# Patient Record
Sex: Male | Born: 1980 | Race: Black or African American | Hispanic: No | Smoking: Current every day smoker
Health system: Southern US, Community
[De-identification: ages and names within clinical notes are randomized; demographics above are authoritative.]

## PROBLEM LIST (undated history)

## (undated) DIAGNOSIS — F1010 Alcohol abuse, uncomplicated: Secondary | ICD-10-CM

## (undated) DIAGNOSIS — N419 Inflammatory disease of prostate, unspecified: Secondary | ICD-10-CM

## (undated) DIAGNOSIS — I10 Essential (primary) hypertension: Secondary | ICD-10-CM

## (undated) HISTORY — DX: Essential (primary) hypertension: I10

---

## 2009-08-31 ENCOUNTER — Emergency Department (HOSPITAL_COMMUNITY): Admission: EM | Admit: 2009-08-31 | Discharge: 2009-08-31 | Payer: Self-pay | Admitting: Emergency Medicine

## 2009-12-05 ENCOUNTER — Emergency Department (HOSPITAL_COMMUNITY): Admission: EM | Admit: 2009-12-05 | Discharge: 2009-12-05 | Payer: Self-pay | Admitting: Emergency Medicine

## 2010-09-28 ENCOUNTER — Emergency Department (HOSPITAL_COMMUNITY): Payer: Self-pay

## 2010-09-28 ENCOUNTER — Emergency Department (HOSPITAL_COMMUNITY)
Admission: EM | Admit: 2010-09-28 | Discharge: 2010-09-29 | Disposition: A | Discharge status: Home / Self Care | Payer: Self-pay | Attending: Emergency Medicine | Admitting: Emergency Medicine

## 2010-09-28 DIAGNOSIS — N509 Disorder of male genital organs, unspecified: Secondary | ICD-10-CM | POA: Insufficient documentation

## 2010-09-28 DIAGNOSIS — F17200 Nicotine dependence, unspecified, uncomplicated: Secondary | ICD-10-CM | POA: Insufficient documentation

## 2010-09-28 DIAGNOSIS — R61 Generalized hyperhidrosis: Secondary | ICD-10-CM | POA: Insufficient documentation

## 2010-09-28 DIAGNOSIS — R634 Abnormal weight loss: Secondary | ICD-10-CM | POA: Insufficient documentation

## 2010-09-28 LAB — DIFFERENTIAL
Eosinophils Relative: 2 % (ref 0–5)
Lymphocytes Relative: 54 % — ABNORMAL HIGH (ref 12–46)
Lymphs Abs: 4.8 10*3/uL — ABNORMAL HIGH (ref 0.7–4.0)

## 2010-09-28 LAB — CBC
HCT: 41 % (ref 39.0–52.0)
MCV: 98.8 fL (ref 78.0–100.0)
RBC: 4.15 MIL/uL — ABNORMAL LOW (ref 4.22–5.81)
RDW: 14.7 % (ref 11.5–15.5)
WBC: 8.9 10*3/uL (ref 4.0–10.5)

## 2010-09-28 LAB — COMPREHENSIVE METABOLIC PANEL
Alkaline Phosphatase: 53 U/L (ref 39–117)
BUN: 9 mg/dL (ref 6–23)
CO2: 32 mEq/L (ref 19–32)
Chloride: 102 mEq/L (ref 96–112)
GFR calc non Af Amer: >60 mL/min (ref >60)
Glucose, Bld: 96 mg/dL (ref 70–99)
Potassium: 3.7 mEq/L (ref 3.5–5.1)
Total Bilirubin: 0.4 mg/dL (ref 0.3–1.2)

## 2010-09-29 ENCOUNTER — Emergency Department (HOSPITAL_COMMUNITY): Payer: Self-pay

## 2010-09-29 LAB — URINE MICROSCOPIC-ADD ON

## 2010-09-29 LAB — URINALYSIS, ROUTINE W REFLEX MICROSCOPIC
Protein, ur: NEGATIVE mg/dL
Urobilinogen, UA: 1 mg/dL (ref 0.0–1.0)

## 2010-09-29 MED ORDER — IOHEXOL 300 MG/ML  SOLN: 100.0000 mL | Freq: Once | INTRAMUSCULAR | Status: DC | PRN

## 2010-10-05 ENCOUNTER — Emergency Department (HOSPITAL_COMMUNITY)
Admission: EM | Admit: 2010-10-05 | Discharge: 2010-10-05 | Disposition: A | Discharge status: Home / Self Care | Payer: Self-pay | Attending: Emergency Medicine | Admitting: Emergency Medicine

## 2010-10-05 DIAGNOSIS — R1013 Epigastric pain: Secondary | ICD-10-CM | POA: Insufficient documentation

## 2010-10-05 DIAGNOSIS — R197 Diarrhea, unspecified: Secondary | ICD-10-CM | POA: Insufficient documentation

## 2010-10-05 DIAGNOSIS — R10816 Epigastric abdominal tenderness: Secondary | ICD-10-CM | POA: Insufficient documentation

## 2010-10-05 DIAGNOSIS — N4289 Other specified disorders of prostate: Secondary | ICD-10-CM | POA: Insufficient documentation

## 2010-10-05 LAB — URINALYSIS, ROUTINE W REFLEX MICROSCOPIC
Bilirubin Urine: NEGATIVE
Hgb urine dipstick: NEGATIVE
Ketones, ur: NEGATIVE mg/dL
Nitrite: NEGATIVE
Protein, ur: NEGATIVE mg/dL
Specific Gravity, Urine: 1.006 (ref 1.005–1.030)
Urine Glucose, Fasting: NEGATIVE mg/dL
Urobilinogen, UA: 0.2 mg/dL (ref 0.0–1.0)
pH: 6 (ref 5.0–8.0)

## 2010-10-29 LAB — URINALYSIS, ROUTINE W REFLEX MICROSCOPIC
Bilirubin Urine: NEGATIVE
Hgb urine dipstick: NEGATIVE
Nitrite: NEGATIVE
Specific Gravity, Urine: 1.027 (ref 1.005–1.030)
pH: 7 (ref 5.0–8.0)

## 2010-10-29 LAB — URINE MICROSCOPIC-ADD ON

## 2010-10-30 LAB — URINALYSIS, ROUTINE W REFLEX MICROSCOPIC
Bilirubin Urine: NEGATIVE
Glucose, UA: NEGATIVE mg/dL
Ketones, ur: 15 mg/dL — AB
Nitrite: NEGATIVE
Protein, ur: 30 mg/dL — AB
Specific Gravity, Urine: 1.026 (ref 1.005–1.030)
Urobilinogen, UA: 1 mg/dL (ref 0.0–1.0)
pH: 6 (ref 5.0–8.0)

## 2010-10-30 LAB — URINE MICROSCOPIC-ADD ON

## 2010-10-30 LAB — URINE CULTURE: Colony Count: >=100000

## 2011-02-01 ENCOUNTER — Emergency Department (HOSPITAL_COMMUNITY)
Admission: EM | Admit: 2011-02-01 | Discharge: 2011-02-01 | Disposition: A | Discharge status: Home / Self Care | Payer: Self-pay | Attending: Emergency Medicine | Admitting: Emergency Medicine

## 2011-02-01 ENCOUNTER — Emergency Department (HOSPITAL_COMMUNITY): Payer: Self-pay

## 2011-02-01 ENCOUNTER — Encounter (HOSPITAL_COMMUNITY): Payer: Self-pay | Admitting: Radiology

## 2011-02-01 DIAGNOSIS — R079 Chest pain, unspecified: Secondary | ICD-10-CM | POA: Insufficient documentation

## 2011-02-01 DIAGNOSIS — M549 Dorsalgia, unspecified: Secondary | ICD-10-CM | POA: Insufficient documentation

## 2011-02-01 DIAGNOSIS — N509 Disorder of male genital organs, unspecified: Secondary | ICD-10-CM | POA: Insufficient documentation

## 2011-02-01 DIAGNOSIS — R042 Hemoptysis: Secondary | ICD-10-CM | POA: Insufficient documentation

## 2011-02-01 DIAGNOSIS — R361 Hematospermia: Secondary | ICD-10-CM | POA: Insufficient documentation

## 2011-02-01 DIAGNOSIS — R10819 Abdominal tenderness, unspecified site: Secondary | ICD-10-CM | POA: Insufficient documentation

## 2011-02-01 DIAGNOSIS — R0602 Shortness of breath: Secondary | ICD-10-CM | POA: Insufficient documentation

## 2011-02-01 DIAGNOSIS — R319 Hematuria, unspecified: Secondary | ICD-10-CM | POA: Insufficient documentation

## 2011-02-01 DIAGNOSIS — R109 Unspecified abdominal pain: Secondary | ICD-10-CM | POA: Insufficient documentation

## 2011-02-01 DIAGNOSIS — K6289 Other specified diseases of anus and rectum: Secondary | ICD-10-CM | POA: Insufficient documentation

## 2011-02-01 DIAGNOSIS — N419 Inflammatory disease of prostate, unspecified: Secondary | ICD-10-CM | POA: Insufficient documentation

## 2011-02-01 LAB — URINALYSIS, ROUTINE W REFLEX MICROSCOPIC
Bilirubin Urine: NEGATIVE
Glucose, UA: NEGATIVE mg/dL
Ketones, ur: NEGATIVE mg/dL
Protein, ur: 30 mg/dL — AB
Urobilinogen, UA: 1 mg/dL (ref 0.0–1.0)

## 2011-02-01 LAB — DIFFERENTIAL
Basophils Absolute: 0 10*3/uL (ref 0.0–0.1)
Eosinophils Relative: 2 % (ref 0–5)
Lymphocytes Relative: 42 % (ref 12–46)
Lymphs Abs: 2.5 10*3/uL (ref 0.7–4.0)
Neutrophils Relative %: 41 % — ABNORMAL LOW (ref 43–77)

## 2011-02-01 LAB — URINE MICROSCOPIC-ADD ON

## 2011-02-01 LAB — CBC
HCT: 44.9 % (ref 39.0–52.0)
MCV: 95.5 fL (ref 78.0–100.0)
Platelets: 291 10*3/uL (ref 150–400)
RBC: 4.7 MIL/uL (ref 4.22–5.81)
RDW: 13.5 % (ref 11.5–15.5)
WBC: 5.9 10*3/uL (ref 4.0–10.5)

## 2011-02-01 LAB — BASIC METABOLIC PANEL
BUN: 12 mg/dL (ref 6–23)
CO2: 30 mEq/L (ref 19–32)
Chloride: 100 mEq/L (ref 96–112)
Creatinine, Ser: 0.81 mg/dL (ref 0.50–1.35)
GFR calc Af Amer: >60 mL/min (ref >60)
Potassium: 4.9 mEq/L (ref 3.5–5.1)

## 2011-02-01 MED ORDER — IOHEXOL 300 MG/ML  SOLN
100.0000 mL | Freq: Once | INTRAMUSCULAR | Status: AC | PRN
  Administered 2011-02-01: 100 mL via INTRAVENOUS

## 2011-08-20 ENCOUNTER — Emergency Department (HOSPITAL_COMMUNITY)
Admission: EM | Admit: 2011-08-20 | Discharge: 2011-08-20 | Disposition: A | Discharge status: Home / Self Care | Payer: Self-pay | Attending: Emergency Medicine | Admitting: Emergency Medicine

## 2011-08-20 ENCOUNTER — Emergency Department (HOSPITAL_COMMUNITY): Payer: Self-pay

## 2011-08-20 ENCOUNTER — Encounter (HOSPITAL_COMMUNITY): Payer: Self-pay

## 2011-08-20 DIAGNOSIS — M549 Dorsalgia, unspecified: Secondary | ICD-10-CM | POA: Insufficient documentation

## 2011-08-20 DIAGNOSIS — R109 Unspecified abdominal pain: Secondary | ICD-10-CM | POA: Insufficient documentation

## 2011-08-20 DIAGNOSIS — N419 Inflammatory disease of prostate, unspecified: Secondary | ICD-10-CM | POA: Insufficient documentation

## 2011-08-20 LAB — CBC
MCH: 33.5 pg (ref 26.0–34.0)
MCHC: 34.2 g/dL (ref 30.0–36.0)
Platelets: 218 10*3/uL (ref 150–400)
RDW: 13.4 % (ref 11.5–15.5)

## 2011-08-20 LAB — URINE CULTURE
Colony Count: >=100000
Culture  Setup Time: 201301082215

## 2011-08-20 LAB — DIFFERENTIAL
Basophils Absolute: 0 10*3/uL (ref 0.0–0.1)
Eosinophils Absolute: 0.1 10*3/uL (ref 0.0–0.7)
Lymphocytes Relative: 20 % (ref 12–46)
Lymphs Abs: 1.3 10*3/uL (ref 0.7–4.0)
Neutrophils Relative %: 64 % (ref 43–77)

## 2011-08-20 LAB — URINALYSIS, ROUTINE W REFLEX MICROSCOPIC
Glucose, UA: NEGATIVE mg/dL
Ketones, ur: NEGATIVE mg/dL
Nitrite: NEGATIVE
Protein, ur: NEGATIVE mg/dL

## 2011-08-20 LAB — LIPASE, BLOOD: Lipase: 55 U/L (ref 11–59)

## 2011-08-20 LAB — COMPREHENSIVE METABOLIC PANEL
ALT: 13 U/L (ref 0–53)
AST: 20 U/L (ref 0–37)
CO2: 31 mEq/L (ref 19–32)
Chloride: 100 mEq/L (ref 96–112)
GFR calc Af Amer: >90 mL/min (ref >90)
GFR calc non Af Amer: >90 mL/min (ref >90)
Glucose, Bld: 101 mg/dL — ABNORMAL HIGH (ref 70–99)
Sodium: 137 mEq/L (ref 135–145)
Total Bilirubin: 0.9 mg/dL (ref 0.3–1.2)

## 2011-08-20 LAB — URINE MICROSCOPIC-ADD ON

## 2011-08-20 MED ORDER — TAMSULOSIN HCL 0.4 MG PO CAPS: 0.4000 mg | ORAL_CAPSULE | Freq: Every day | ORAL | Status: DC

## 2011-08-20 MED ORDER — KETOROLAC TROMETHAMINE 30 MG/ML IJ SOLN: 30.0000 mg | Freq: Once | INTRAMUSCULAR | Status: DC

## 2011-08-20 MED ORDER — SODIUM CHLORIDE 0.9 % IV SOLN: Freq: Once | INTRAVENOUS | Status: DC

## 2011-08-20 MED ORDER — IOHEXOL 300 MG/ML  SOLN
80.0000 mL | Freq: Once | INTRAMUSCULAR | Status: AC | PRN
  Administered 2011-08-20: 80 mL via INTRAVENOUS

## 2011-08-20 MED ORDER — KETOROLAC TROMETHAMINE 60 MG/2ML IM SOLN
60.0000 mg | Freq: Once | INTRAMUSCULAR | Status: AC
  Administered 2011-08-20: 60 mg via INTRAMUSCULAR
  Filled 2011-08-20: qty 2

## 2011-08-20 MED ORDER — HYDROCODONE-ACETAMINOPHEN 5-325 MG PO TABS: 1.0000 | ORAL_TABLET | ORAL | Status: AC | PRN

## 2011-08-20 MED ORDER — DOXYCYCLINE HYCLATE 100 MG PO CAPS: 100.0000 mg | ORAL_CAPSULE | Freq: Two times a day (BID) | ORAL | Status: AC

## 2011-08-20 MED ORDER — SODIUM CHLORIDE 0.9 % IV BOLUS (SEPSIS)
1000.0000 mL | Freq: Once | INTRAVENOUS | Status: AC
  Administered 2011-08-20: 1000 mL via INTRAVENOUS

## 2011-08-20 NOTE — ED Notes (Signed)
Mid back pain with n/v for 2 days with cough

## 2011-08-20 NOTE — ED Notes (Signed)
Pt reports bilateral lower back pain x 2 days. Denies any injury. Denies any urinary symptoms. Pt reports intermittent upper abdominal pain associated with nausea and vomiting. Pt is very nonspecific in his complaints. Denies cough but states he has had cold like symptoms. Pt reports hx of infection to prostate? Denies any blood in urine.

## 2011-08-20 NOTE — ED Provider Notes (Signed)
History     CSN: [id number]  Arrival date & time 08/20/11  1015   First MD Initiated Contact with Patient 08/20/11 1029      Chief Complaint  Patient presents with  . Back Pain    (Consider location/radiation/quality/duration/timing/severity/associated sxs/prior treatment) Patient is a 31 y.o. male presenting with back pain. The history is provided by the patient.  Back Pain  This is a new problem. The current episode started 2 days ago. The problem occurs constantly. The problem has not changed since onset.The pain is associated with no known injury. The pain is present in the lumbar spine. The quality of the pain is described as aching. The pain does not radiate. The symptoms are aggravated by certain positions. The pain is the same all the time. Associated symptoms include abdominal pain. Pertinent negatives include no chest pain, no fever, no numbness, no weight loss, no headaches, no dysuria and no weakness.   Pt began to have back pain two days ago. Pain is located at mid back on both sides and seems to radiate to the right upper abdomen. He denies associated fever, nausea, vomiting.  Pt has a PMH of prostatitis. States he was seen for this when he lived in [place]; he was tested for STIs which was negative. He was on antibiotics for a brief period of time, several months. He has not been seen by urology since moving to [place] a year or two ago, citing financial reasons. He denies currently having urinary sx but admits rectal pain with defecation. He has had testicular pain when he has had this in the past but has not had any with the current illness.   History reviewed. No pertinent past medical history.  History reviewed. No pertinent past surgical history.  History reviewed. No pertinent family history.  History  Substance Use Topics  . Smoking status: Never Smoker   . Smokeless tobacco: Not on file  . Alcohol Use: No      Review of Systems  Constitutional: Negative for fever,  chills, weight loss, appetite change and unexpected weight change.  HENT: Negative for congestion, sore throat and rhinorrhea.   Respiratory: Negative for cough and shortness of breath.   Cardiovascular: Negative for chest pain and palpitations.  Gastrointestinal: Positive for nausea, vomiting and abdominal pain. Negative for diarrhea and constipation.  Genitourinary: Positive for flank pain. Negative for dysuria and testicular pain.  Musculoskeletal: Positive for back pain. Negative for myalgias and gait problem.  Skin: Negative for color change and rash.  Neurological: Negative for dizziness, weakness, numbness and headaches.    Allergies  Review of patient's allergies indicates no known allergies.  Home Medications   Current Outpatient Rx  Name Route Sig Dispense Refill  . IBUPROFEN 200 MG PO TABS Oral Take 200 mg by mouth 3 (three) times daily as needed. For pain       BP 115/72  Pulse 85  Temp(Src) 98.3 F (36.8 C) (Oral)  Resp 20  Ht 5\' 1"  (1.549 m)  Wt 145 lb (65.772 kg)  BMI 27.40 kg/m2  SpO2 98%  Physical Exam  Nursing note and vitals reviewed. Constitutional: He is oriented to person, place, and time. He appears well-developed and well-nourished. No distress.  HENT:  Head: Normocephalic and atraumatic.  Eyes: Conjunctivae and EOM are normal. Right eye exhibits no discharge. Left eye exhibits no discharge.  Neck: Normal range of motion.  Cardiovascular: Normal rate, regular rhythm and normal heart sounds.   Pulmonary/Chest: Effort normal and  breath sounds normal. No respiratory distress. He exhibits no tenderness.  Abdominal: Soft. Bowel sounds are normal. He exhibits no distension and no mass. There is tenderness. There is no rebound and no guarding.       Mildly tender to palpation in RUQ, questionably positive Murphy sign  Genitourinary: Prostate is enlarged and tender.  Musculoskeletal: Normal range of motion.  Neurological: He is alert and oriented to person,  place, and time.  Skin: Skin is warm and dry. He is not diaphoretic.  Psychiatric: He has a normal mood and affect.    ED Course  Procedures (including critical care time)  Labs Reviewed  COMPREHENSIVE METABOLIC PANEL - Abnormal; Notable for the following:    Glucose, Bld 101 (*)    All other components within normal limits  DIFFERENTIAL - Abnormal; Notable for the following:    Monocytes Relative 14 (*)    All other components within normal limits  URINALYSIS, ROUTINE W REFLEX MICROSCOPIC - Abnormal; Notable for the following:    APPearance TURBID (*)    Hgb urine dipstick LARGE (*)    Leukocytes, UA MODERATE (*)    All other components within normal limits  URINE MICROSCOPIC-ADD ON - Abnormal; Notable for the following:    Squamous Epithelial / LPF FEW (*)    Bacteria, UA FEW (*)    All other components within normal limits  CBC  LIPASE, BLOOD  GRAM STAIN  URINE CULTURE   Ct Abdomen Pelvis W Contrast  08/20/2011  *RADIOLOGY REPORT*  Clinical Data: Upper abdominal pain, nausea, vomiting.  CT ABDOMEN AND PELVIS WITH CONTRAST  Technique:  Multidetector CT imaging of the abdomen and pelvis was performed following the standard protocol during bolus administration of intravenous contrast.  Contrast: 80mL OMNIPAQUE IOHEXOL 300 MG/ML IV SOLN  Comparison: 02/01/2011  Findings: Lung bases are clear.  No effusions.  Heart is normal size.  Liver, gallbladder, spleen, pancreas, adrenals and kidneys are unremarkable.  Appendix is visualized and is normal. Bowel grossly unremarkable.  No free fluid, free air, or adenopathy.  Urinary bladder is unremarkable.  Multiple calcified phleboliths in the anatomic pelvis.  IMPRESSION: No acute findings in the abdomen or pelvis.  Original Report Authenticated By: Cyndie Chime, M.D.     1. Prostatitis       MDM  11:34 AM Pt with palpable paravertebral muscle spasm, however he also has RUQ tenderness, questionable Murphy's sign. He has had PMH of  prostatitis. Plan to obtain basic labs.  3:11 PM Pt's urine with large hgb (21-50 RBCs), mod leuks (11-20 WBCs). Rectal exam significant for tender, enlarged prostate. Pt was sent for CT which was negative. Consult placed to urology to discuss and ensure follow up.   3:30 PM Discussed with Dr. Ellin Goodie nurse; he will return call in next 30 mins.  4:19 PM Discussed with Dr. Isabel Caprice - he recommended: 1) send urine for cx 2) doxycycline BID 3) alpha blocker for urinary sx 4) follow up in 2-3 weeks Findings d/w pt. He verbalized understanding and agreed to plan.  Grant Fontana, [place] 08/20/11 [phone]

## 2011-08-20 NOTE — ED Provider Notes (Signed)
Medical screening examination/treatment/procedure(s) were performed by non-physician practitioner and as supervising physician I was immediately available for consultation/collaboration.   Dayton Bailiff, MD 08/20/11 [phone]

## 2011-12-18 ENCOUNTER — Encounter (HOSPITAL_COMMUNITY): Payer: Self-pay | Admitting: *Deleted

## 2011-12-18 ENCOUNTER — Emergency Department (HOSPITAL_COMMUNITY)
Admission: EM | Admit: 2011-12-18 | Discharge: 2011-12-18 | Disposition: A | Discharge status: Home / Self Care | Payer: Self-pay | Attending: Emergency Medicine | Admitting: Emergency Medicine

## 2011-12-18 DIAGNOSIS — K0889 Other specified disorders of teeth and supporting structures: Secondary | ICD-10-CM

## 2011-12-18 DIAGNOSIS — K089 Disorder of teeth and supporting structures, unspecified: Secondary | ICD-10-CM | POA: Insufficient documentation

## 2011-12-18 HISTORY — DX: Inflammatory disease of prostate, unspecified: N41.9

## 2011-12-18 MED ORDER — HYDROCODONE-ACETAMINOPHEN 5-325 MG PO TABS: 1.0000 | ORAL_TABLET | ORAL | Status: AC | PRN

## 2011-12-18 MED ORDER — HYDROCODONE-ACETAMINOPHEN 5-325 MG PO TABS
1.0000 | ORAL_TABLET | Freq: Once | ORAL | Status: AC
  Administered 2011-12-18: 1 via ORAL
  Filled 2011-12-18: qty 1

## 2011-12-18 MED ORDER — PENICILLIN V POTASSIUM 250 MG PO TABS
500.0000 mg | ORAL_TABLET | Freq: Once | ORAL | Status: AC
  Administered 2011-12-18: 500 mg via ORAL
  Filled 2011-12-18: qty 2

## 2011-12-18 MED ORDER — PENICILLIN V POTASSIUM 500 MG PO TABS: 500.0000 mg | ORAL_TABLET | Freq: Three times a day (TID) | ORAL | Status: AC

## 2011-12-18 NOTE — Discharge Instructions (Signed)
Dental Pain  A tooth ache may be caused by cavities (tooth decay). Cavities expose the nerve of the tooth to air and hot or cold temperatures. It may come from an infection or abscess (also called a boil or furuncle) around your tooth. It is also often caused by dental caries (tooth decay). This causes the pain you are having.  DIAGNOSIS   Your caregiver can diagnose this problem by exam.  TREATMENT   · If caused by an infection, it may be treated with medications which kill germs (antibiotics) and pain medications as prescribed by your caregiver. Take medications as directed.  · Only take over-the-counter or prescription medicines for pain, discomfort, or fever as directed by your caregiver.  · Whether the tooth ache today is caused by infection or dental disease, you should see your dentist as soon as possible for further care.  SEEK MEDICAL CARE IF:  The exam and treatment you received today has been provided on an emergency basis only. This is not a substitute for complete medical or dental care. If your problem worsens or new problems (symptoms) appear, and you are unable to meet with your dentist, call or return to this location.  SEEK IMMEDIATE MEDICAL CARE IF:   · You have a fever.  · You develop redness and swelling of your face, jaw, or neck.  · You are unable to open your mouth.  · You have severe pain uncontrolled by pain medicine.  MAKE SURE YOU:   · Understand these instructions.  · Will watch your condition.  · Will get help right away if you are not doing well or get worse.  Document Released: 07/29/2005 Document Revised: 07/18/2011 Document Reviewed: 03/16/2008  [employer]® Patient Information ©2012 [employer].

## 2011-12-18 NOTE — ED Provider Notes (Signed)
History     CSN: [id number]  Arrival date & time 12/18/11  2147   First MD Initiated Contact with Patient 12/18/11 2335      Chief Complaint  Patient presents with  . Dental Pain    (Consider location/radiation/quality/duration/timing/severity/associated sxs/prior treatment) Patient is a 31 y.o. male presenting with tooth pain. The history is provided by the patient.  Dental PainPrimary symptoms do not include mouth pain, dental injury, fever, sore throat, angioedema or cough. The symptoms began 3 to 5 days ago. The symptoms are worsening. The symptoms occur constantly.  Additional symptoms include: dental sensitivity to temperature. Additional symptoms do not include: gum swelling, gum tenderness, purulent gums, trismus, trouble swallowing, pain with swallowing and swollen glands.  The pain was severe tonight so he came to the ED.  Past Medical History  Diagnosis Date  . Prostatitis     History reviewed. No pertinent past surgical history.  No family history on file.  History  Substance Use Topics  . Smoking status: Current Some Day Smoker  . Smokeless tobacco: Not on file  . Alcohol Use: Yes      Review of Systems  Constitutional: Negative for fever.  HENT: Negative for sore throat and trouble swallowing.   Respiratory: Negative for cough.   All other systems reviewed and are negative.    Allergies  Review of patient's allergies indicates no known allergies.  Home Medications   Current Outpatient Rx  Name Route Sig Dispense Refill  . ACETAMINOPHEN 500 MG PO TABS Oral Take 500 mg by mouth every 6 (six) hours as needed. For pain    . IBUPROFEN 200 MG PO TABS Oral Take 200 mg by mouth 3 (three) times daily as needed. For pain     . HYDROCODONE-ACETAMINOPHEN 5-325 MG PO TABS Oral Take 1-2 tablets by mouth every 4 (four) hours as needed for pain. 18 tablet 0  . PENICILLIN V POTASSIUM 500 MG PO TABS Oral Take 1 tablet (500 mg total) by mouth 3 (three) times daily.  30 tablet 0    BP 106/64  Pulse 79  Temp(Src) 97.9 F (36.6 C) (Oral)  Resp 18  SpO2 97%  Physical Exam  Nursing note and vitals reviewed. Constitutional: He appears well-developed and well-nourished. No distress.  HENT:  Head: Normocephalic and atraumatic.  Right Ear: External ear normal.  Left Ear: External ear normal.       Dental caries, no obvious swelling, ttp  Eyes: Conjunctivae are normal. Right eye exhibits no discharge. Left eye exhibits no discharge. No scleral icterus.  Neck: Neck supple. No tracheal deviation present.  Cardiovascular: Normal rate.   Pulmonary/Chest: Effort normal. No stridor. No respiratory distress.  Musculoskeletal: He exhibits no edema.  Neurological: He is alert. Cranial nerve deficit: no gross deficits.  Skin: Skin is warm and dry. No rash noted.  Psychiatric: He has a normal mood and affect.    ED Course  Procedures (including critical care time)  Labs Reviewed - No data to display No results found.   1. Toothache       MDM  Symptoms are consistent with a tooth infection. There are no obvious signs of a severe abscess. He has no significant facial swelling. At this time there does not appear to be any evidence of an emergency medical condition. I will refer him to a dentist/oral surgeon. Patient will be given prescriptions  for pain medications and antibiotics.          Celene Kras, MD  12/19/11 0002 

## 2011-12-18 NOTE — ED Notes (Signed)
C/o L upper and lower toothache, onset 2d ago, also feels pain in facial/ orbital/ nasal area, (denies: fever, nvd, drainage, bleeding or other sx). No relief with tylenol or ibuprofen. Last tylenol and ibuprofen. Took 4 tylenol 1 hr ago & 8 ibuprofen 1.5 hrs ago. Alert, NAD, calm, interactive, skin W&D, resps e/u.  Denies abd or back pain. "have no appetite".

## 2012-01-01 ENCOUNTER — Emergency Department (HOSPITAL_COMMUNITY)
Admission: EM | Admit: 2012-01-01 | Discharge: 2012-01-01 | Disposition: A | Discharge status: Home / Self Care | Payer: Self-pay | Attending: Emergency Medicine | Admitting: Emergency Medicine

## 2012-01-01 ENCOUNTER — Encounter (HOSPITAL_COMMUNITY): Payer: Self-pay | Admitting: *Deleted

## 2012-01-01 DIAGNOSIS — K089 Disorder of teeth and supporting structures, unspecified: Secondary | ICD-10-CM | POA: Insufficient documentation

## 2012-01-01 DIAGNOSIS — K0889 Other specified disorders of teeth and supporting structures: Secondary | ICD-10-CM

## 2012-01-01 DIAGNOSIS — K029 Dental caries, unspecified: Secondary | ICD-10-CM | POA: Insufficient documentation

## 2012-01-01 MED ORDER — OXYCODONE-ACETAMINOPHEN 5-325 MG PO TABS: ORAL_TABLET | ORAL | Status: DC

## 2012-01-01 MED ORDER — OXYCODONE-ACETAMINOPHEN 5-325 MG PO TABS
1.0000 | ORAL_TABLET | Freq: Once | ORAL | Status: AC
  Administered 2012-01-01: 1 via ORAL
  Filled 2012-01-01: qty 1

## 2012-01-01 MED ORDER — PENICILLIN V POTASSIUM 500 MG PO TABS: 500.0000 mg | ORAL_TABLET | Freq: Three times a day (TID) | ORAL | Status: AC

## 2012-01-01 NOTE — Discharge Instructions (Signed)
Dental Pain  A tooth ache may be caused by cavities (tooth decay). Cavities expose the nerve of the tooth to air and hot or cold temperatures. It may come from an infection or abscess (also called a boil or furuncle) around your tooth. It is also often caused by dental caries (tooth decay). This causes the pain you are having.  DIAGNOSIS   Your caregiver can diagnose this problem by exam.  TREATMENT   · If caused by an infection, it may be treated with medications which kill germs (antibiotics) and pain medications as prescribed by your caregiver. Take medications as directed.  · Only take over-the-counter or prescription medicines for pain, discomfort, or fever as directed by your caregiver.  · Whether the tooth ache today is caused by infection or dental disease, you should see your dentist as soon as possible for further care.  SEEK MEDICAL CARE IF:  The exam and treatment you received today has been provided on an emergency basis only. This is not a substitute for complete medical or dental care. If your problem worsens or new problems (symptoms) appear, and you are unable to meet with your dentist, call or return to this location.  SEEK IMMEDIATE MEDICAL CARE IF:   · You have a fever.  · You develop redness and swelling of your face, jaw, or neck.  · You are unable to open your mouth.  · You have severe pain uncontrolled by pain medicine.  MAKE SURE YOU:   · Understand these instructions.  · Will watch your condition.  · Will get help right away if you are not doing well or get worse.  Document Released: 07/29/2005 Document Revised: 07/18/2011 Document Reviewed: 03/16/2008  [employer]® Patient Information ©2012 [employer].

## 2012-01-01 NOTE — ED Notes (Signed)
Returned to ED for treatment of dental pain. Pt has continued pain throughout mouth.

## 2012-01-01 NOTE — ED Provider Notes (Signed)
History    This chart was scribed for Gavin Pound. Oletta Lamas, MD, MD by Smitty Pluck. The patient was seen in room STRE5 and the patient's care was started at 11:24AM.   CSN: [id number]  Arrival date & time 01/01/12  1022   First MD Initiated Contact with Patient 01/01/12 1112      Chief Complaint  Patient presents with  . Dental Pain    (Consider location/radiation/quality/duration/timing/severity/associated sxs/prior treatment) The history is provided by the patient.   Eddie Smith is a 31 y.o. male who presents to the Emergency Department complaining of moderate bilateral dental pain. Pt reports being in the ED before with same pain and was given referral to dentist. Pt did not go to dentist. Pain has been constant without radiation. Denies any other pain. Pt has tried pain medication without relief.  Past Medical History  Diagnosis Date  . Prostatitis     History reviewed. No pertinent past surgical history.  History reviewed. No pertinent family history.  History  Substance Use Topics  . Smoking status: Current Some Day Smoker  . Smokeless tobacco: Not on file  . Alcohol Use: Yes      Review of Systems  All other systems reviewed and are negative.   10 Systems reviewed and all are negative for acute change except as noted in the HPI.   Allergies  Review of patient's allergies indicates no known allergies.  Home Medications   Current Outpatient Rx  Name Route Sig Dispense Refill  . IBUPROFEN 200 MG PO TABS Oral Take 200 mg by mouth 3 (three) times daily as needed. For pain     . PENICILLIN V POTASSIUM 500 MG PO TABS Oral Take 500 mg by mouth 2 (two) times daily.    Marland Kitchen PRESCRIPTION MEDICATION Oral Take 1 tablet by mouth as needed. For pain (patient thought it was hydrocodone or oxycodone but wasn't sure)    . OXYCODONE-ACETAMINOPHEN 5-325 MG PO TABS  1-2 tablets po q 6 hours prn moderate to severe pain 15 tablet 0  . PENICILLIN V POTASSIUM 500 MG PO TABS  Oral Take 1 tablet (500 mg total) by mouth 3 (three) times daily. 30 tablet 0    BP 103/71  Pulse 71  Temp(Src) 98.7 F (37.1 C) (Oral)  Resp 16  SpO2 98%  Physical Exam  Nursing note and vitals reviewed. Constitutional: He is oriented to person, place, and time. He appears well-developed and well-nourished. No distress.  HENT:  Head: Normocephalic and atraumatic.       1st and 3rd molar on bottom right decayed 2nd and 3rd molar on bottom left decayed  2nd and 3rd on top right are decayed No abscess  No trismus    Eyes: Conjunctivae are normal. Pupils are equal, round, and reactive to light.  Neck: Normal range of motion.  Pulmonary/Chest: Effort normal. No respiratory distress.  Abdominal: Soft. He exhibits no distension.  Neurological: He is alert and oriented to person, place, and time.  Skin: Skin is warm and dry.  Psychiatric: He has a normal mood and affect. His behavior is normal.    ED Course  Procedures (including critical care time) DIAGNOSTIC STUDIES: Oxygen Saturation is 98% on room air, normal by my interpretation.    COORDINATION OF CARE: 11:29AM EDP discusses pt ED treatment with pt   Labs Reviewed - No data to display No results found.   1. Pain, dental       MDM  I personally performed the  services described in this documentation, which was scribed in my presence. The recorded information has been reviewed and considered.   Pt told that he needs to keep his appt's and use referrals provided to him .  They report new to town and have to use bus so was difficult to find the dentist referred last time.  This time Dr. Oswaldo Done is just down [address] [facility] so pt reports will try to go there today.         Gavin Pound. Oneka Parada, MD 01/01/12 1142

## 2012-02-12 ENCOUNTER — Encounter (HOSPITAL_COMMUNITY): Payer: Self-pay

## 2012-02-12 ENCOUNTER — Emergency Department (HOSPITAL_COMMUNITY): Payer: Self-pay

## 2012-02-12 ENCOUNTER — Emergency Department (HOSPITAL_COMMUNITY)
Admission: EM | Admit: 2012-02-12 | Discharge: 2012-02-13 | Disposition: A | Discharge status: Home / Self Care | Payer: Self-pay | Attending: Emergency Medicine | Admitting: Emergency Medicine

## 2012-02-12 DIAGNOSIS — R319 Hematuria, unspecified: Secondary | ICD-10-CM | POA: Insufficient documentation

## 2012-02-12 DIAGNOSIS — R1110 Vomiting, unspecified: Secondary | ICD-10-CM | POA: Insufficient documentation

## 2012-02-12 DIAGNOSIS — R109 Unspecified abdominal pain: Secondary | ICD-10-CM | POA: Insufficient documentation

## 2012-02-12 DIAGNOSIS — F17200 Nicotine dependence, unspecified, uncomplicated: Secondary | ICD-10-CM | POA: Insufficient documentation

## 2012-02-12 LAB — POCT I-STAT, CHEM 8
Calcium, Ion: 1.28 mmol/L (ref 1.12–1.32)
Chloride: 102 mEq/L (ref 96–112)
HCT: 48 % (ref 39.0–52.0)
Sodium: 142 mEq/L (ref 135–145)
TCO2: 29 mmol/L (ref 0–100)

## 2012-02-12 LAB — URINALYSIS, ROUTINE W REFLEX MICROSCOPIC
Bilirubin Urine: NEGATIVE
Glucose, UA: NEGATIVE mg/dL
Specific Gravity, Urine: 1.027 (ref 1.005–1.030)
Urobilinogen, UA: 1 mg/dL (ref 0.0–1.0)
pH: 5.5 (ref 5.0–8.0)

## 2012-02-12 LAB — CBC WITH DIFFERENTIAL/PLATELET
Eosinophils Absolute: 0.1 10*3/uL (ref 0.0–0.7)
Eosinophils Relative: 2 % (ref 0–5)
Hemoglobin: 15.4 g/dL (ref 13.0–17.0)
Lymphocytes Relative: 52 % — ABNORMAL HIGH (ref 12–46)
Lymphs Abs: 3 10*3/uL (ref 0.7–4.0)
MCH: 33 pg (ref 26.0–34.0)
MCV: 96.8 fL (ref 78.0–100.0)
Monocytes Relative: 12 % (ref 3–12)
Platelets: 255 10*3/uL (ref 150–400)
RBC: 4.67 MIL/uL (ref 4.22–5.81)
WBC: 5.9 10*3/uL (ref 4.0–10.5)

## 2012-02-12 LAB — URINE MICROSCOPIC-ADD ON

## 2012-02-12 MED ORDER — IOHEXOL 300 MG/ML  SOLN
20.0000 mL | INTRAMUSCULAR | Status: DC
  Administered 2012-02-12: 20 mL via ORAL

## 2012-02-12 MED ORDER — GI COCKTAIL ~~LOC~~
30.0000 mL | Freq: Once | ORAL | Status: AC
  Administered 2012-02-12: 30 mL via ORAL
  Filled 2012-02-12: qty 30

## 2012-02-12 MED ORDER — OXYCODONE-ACETAMINOPHEN 5-325 MG PO TABS: 1.0000 | ORAL_TABLET | Freq: Four times a day (QID) | ORAL | Status: AC | PRN

## 2012-02-12 MED ORDER — SODIUM CHLORIDE 0.9 % IV BOLUS (SEPSIS)
1000.0000 mL | Freq: Once | INTRAVENOUS | Status: AC
  Administered 2012-02-12: 1000 mL via INTRAVENOUS

## 2012-02-12 MED ORDER — AZITHROMYCIN 250 MG PO TABS
1000.0000 mg | ORAL_TABLET | Freq: Once | ORAL | Status: AC
  Administered 2012-02-12: 1000 mg via ORAL
  Filled 2012-02-12: qty 4

## 2012-02-12 MED ORDER — DOCUSATE SODIUM 100 MG PO CAPS
100.0000 mg | ORAL_CAPSULE | ORAL | Status: AC
  Administered 2012-02-12: 100 mg via ORAL
  Filled 2012-02-12: qty 1

## 2012-02-12 MED ORDER — HYDROMORPHONE HCL PF 1 MG/ML IJ SOLN
1.0000 mg | Freq: Once | INTRAMUSCULAR | Status: AC
  Administered 2012-02-12: 1 mg via INTRAVENOUS
  Filled 2012-02-12: qty 1

## 2012-02-12 MED ORDER — PROMETHAZINE HCL 25 MG PO TABS: 25.0000 mg | ORAL_TABLET | Freq: Four times a day (QID) | ORAL | Status: DC | PRN

## 2012-02-12 MED ORDER — ONDANSETRON HCL 4 MG/2ML IJ SOLN
4.0000 mg | INTRAMUSCULAR | Status: AC | PRN
  Administered 2012-02-12 (×2): 4 mg via INTRAVENOUS
  Filled 2012-02-12 (×2): qty 2

## 2012-02-12 MED ORDER — DOCUSATE SODIUM 100 MG PO CAPS: 100.0000 mg | ORAL_CAPSULE | Freq: Two times a day (BID) | ORAL | Status: AC

## 2012-02-12 MED ORDER — DEXTROSE 5 % IV SOLN
1.0000 g | Freq: Once | INTRAVENOUS | Status: AC
  Administered 2012-02-12: 1 g via INTRAVENOUS
  Filled 2012-02-12: qty 10

## 2012-02-12 MED ORDER — MORPHINE SULFATE 4 MG/ML IJ SOLN
4.0000 mg | Freq: Once | INTRAMUSCULAR | Status: AC
  Administered 2012-02-12: 4 mg via INTRAVENOUS
  Filled 2012-02-12: qty 1

## 2012-02-12 MED ORDER — MORPHINE SULFATE 4 MG/ML IJ SOLN
8.0000 mg | Freq: Once | INTRAMUSCULAR | Status: AC
  Administered 2012-02-12: 8 mg via INTRAVENOUS
  Filled 2012-02-12: qty 2

## 2012-02-12 MED ORDER — ONDANSETRON HCL 4 MG/2ML IJ SOLN
4.0000 mg | INTRAMUSCULAR | Status: DC | PRN
  Administered 2012-02-12: 4 mg via INTRAVENOUS
  Filled 2012-02-12: qty 2

## 2012-02-12 NOTE — ED Notes (Signed)
Pt presents with 1 week h/o generalized abdominal pain that has been constant.  Pt reports noting blood in his urine and noting blood in his semen.  Pt describes pain as a burning in abdomen.  +nausea and vomiting - noted dark red blood in emesis; pt noted dark red blood in stool yesterday,.

## 2012-02-12 NOTE — ED Notes (Signed)
Provided pt with info for f/u at the [facility] and the [facility].  Also rec. f/u with Medicaid re: any options that may be available to pt.  Pt's fiance told to call CSW if pt unable to get f/u care and other options could be explored.  Contact number given.

## 2012-02-12 NOTE — ED Notes (Signed)
Patient transported to Ultrasound 

## 2012-02-12 NOTE — ED Notes (Signed)
Pt. Family member called RN into room b.c pt. Began coughing up blood again. Bright red blood noted in emesis bag Pt. States that it feels like something is pulling in his abd when he coughs.

## 2012-02-12 NOTE — ED Notes (Signed)
Pt is A/A/OX4, skin is warm and dry, respiration is even and unlabored. VSS.

## 2012-02-12 NOTE — ED Provider Notes (Signed)
History     CSN: [id number]  Arrival date & time 02/12/12  1318   First MD Initiated Contact with Patient 02/12/12 1752      Chief Complaint  Patient presents with  . Abdominal Pain    (Consider location/radiation/quality/duration/timing/severity/associated sxs/prior treatment) HPI Comments: Patient is a current everyday smoker with a history of prostatitis that presents emergency department with multiple chief complaints.  Patient reports that he has had intermittent abdominal pain on and off for the last 2 months, but that recently starting Sunday night it has become constant.  The pain is located in the periumbilical region and is described as a constant burning hot and crampy sensation.  The patient states that he "feels like something is eating me from the inside out".  Severity of pain is 8/10 patient has taken ibuprofen since Monday about 3 pills a day. Associated symptoms include intermittent blood in semen x2 months, hemoptysis, and hematuria that both started today.  In addition patient reports left scrotal pain that began today as well.  He denies swelling of his scrotum, fevers, night sweats, chills, diarrhea, constipation,, dysphasia. patient has no other complaints this time.  Patient is sexually active and does not always use protection.  Patient denies penile discharge.   Patient is a 31 y.o. male presenting with abdominal pain. The history is provided by the patient.  Abdominal Pain The primary symptoms of the illness include abdominal pain.    Past Medical History  Diagnosis Date  . Prostatitis     History reviewed. No pertinent past surgical history.  History reviewed. No pertinent family history.  History  Substance Use Topics  . Smoking status: Current Some Day Smoker  . Smokeless tobacco: Not on file  . Alcohol Use: Yes      Review of Systems  Gastrointestinal: Positive for abdominal pain.  All other systems reviewed and are negative.    Allergies    Review of patient's allergies indicates no known allergies.  Home Medications   Current Outpatient Rx  Name Route Sig Dispense Refill  . IBUPROFEN 200 MG PO TABS Oral Take 800 mg by mouth every 6 (six) hours as needed. For pain      BP 97/67  Pulse 62  Temp 97.7 F (36.5 C) (Oral)  Resp 20  SpO2 62%  Physical Exam  Constitutional: He is oriented to person, place, and time. He appears well-developed and well-nourished. No distress.  HENT:  Head: Normocephalic and atraumatic.  Mouth/Throat: Oropharynx is clear and moist. No oropharyngeal exudate.  Eyes: Conjunctivae and EOM are normal. Pupils are equal, round, and reactive to light. No scleral icterus.  Neck: Normal range of motion. Neck supple. No tracheal deviation present. No thyromegaly present.  Cardiovascular: Normal rate, regular rhythm, normal heart sounds and intact distal pulses.   Pulmonary/Chest: Effort normal and breath sounds normal. No stridor. No respiratory distress. He has no wheezes.  Abdominal: Soft. Normal aorta and bowel sounds are normal. There is tenderness.    Genitourinary:          Exam was chaperoned.  Patient with mild tenderness to left scrotum.  No masses palpable, no swelling or erythema of the skin.  Penis normal and circumcised.  No rashes or lesions. No evidence of inguinal hernia  Musculoskeletal: Normal range of motion. He exhibits no edema and no tenderness.  Neurological: He is alert and oriented to person, place, and time. Coordination normal.  Skin: Skin is warm and dry. No rash noted. He is  not diaphoretic. No erythema. No pallor.  Psychiatric: He has a normal mood and affect. His behavior is normal.    ED Course  Procedures (including critical care time)  Pt seen here in early Jan for hematuria. Pt was to follow up with Dr. Isabel Caprice, urology, but was non compliant. He was dc with the below per [facility] rec: 1) send urine for cx  2) doxycycline BID  3) alpha blocker for urinary sx   4) follow up in 2-3 weeks  Findings d/w pt. He verbalized understanding and agreed to plan.  Labs Reviewed  URINALYSIS, ROUTINE W REFLEX MICROSCOPIC - Abnormal; Notable for the following:    Color, Urine AMBER (*)  BIOCHEMICALS MAY BE AFFECTED BY COLOR   APPearance CLOUDY (*)     Hgb urine dipstick LARGE (*)     Ketones, ur 15 (*)     Protein, ur 30 (*)     Leukocytes, UA MODERATE (*)     All other components within normal limits  CBC WITH DIFFERENTIAL - Abnormal; Notable for the following:    Neutrophils Relative 34 (*)     Lymphocytes Relative 52 (*)     All other components within normal limits  URINE MICROSCOPIC-ADD ON - Abnormal; Notable for the following:    Squamous Epithelial / LPF MANY (*)     Bacteria, UA MANY (*)     All other components within normal limits  POCT I-STAT, CHEM 8  OCCULT BLOOD X 1 CARD TO LAB, STOOL   US Scrotum  02/12/2012  *RADIOLOGY REPORT*  Clinical Data:  Testicular pain.  Hematuria.  SCROTAL ULTRASOUND DOPPLER ULTRASOUND OF THE TESTICLES  Technique: Complete ultrasound examination of the testicles, epididymis, and other scrotal structures was performed.  Color and spectral Doppler ultrasound were also utilized to evaluate blood flow to the testicles.  Comparison:  CT 08/20/2011  Findings:  Right testis:  3.8 x 1.8 x 2.6 cm.  There are tiny echogenic foci compatible with microlithiasis.  No focal mass lesion.  Normal arterial and venous blood flow.  Left testis:  3.5 x 1.8 x 2.8 cm.  Again, microlithiasis noted.  No focal mass lesion.  Normal arterial and venous blood flow.  Right epididymis:  Normal in size and appearance.  Left epididymis:  Normal in size and appearance.  Hydocele:  Absent  Varicocele:  Absent  Pulsed Doppler interrogation of both testes demonstrates low resistance flow bilaterally.  IMPRESSION: No evidence of testicular torsion.  Microlithiasis bilaterally without focal abnormality.  This may be related to increased incidence of testicular  cancer.  Consider urologic consultation and sonographic surveillance.  Original Report Authenticated By: Cyndie Chime, M.D.   US Renal  02/12/2012  *RADIOLOGY REPORT*  Clinical Data: Abdominal pain, testicular pain.  RENAL/URINARY TRACT ULTRASOUND COMPLETE  Comparison:  CT 08/20/2011  Findings:  Right Kidney:  10.9 cm. Normal size and echotexture.  No focal abnormality.  No hydronephrosis.  Left Kidney:  10.6 cm. Normal size and echotexture.  No focal abnormality.  No hydronephrosis.  Bladder:  Grossly unremarkable.  IMPRESSION: Unremarkable study.  Original Report Authenticated By: Cyndie Chime, M.D.   [place] Art/ven Flow Abd Pelv Doppler  02/12/2012  *RADIOLOGY REPORT*  Clinical Data:  Testicular pain.  Hematuria.  SCROTAL ULTRASOUND DOPPLER ULTRASOUND OF THE TESTICLES  Technique: Complete ultrasound examination of the testicles, epididymis, and other scrotal structures was performed.  Color and spectral Doppler ultrasound were also utilized to evaluate blood flow to the testicles.  Comparison:  CT 08/20/2011  Findings:  Right testis:  3.8 x 1.8 x 2.6 cm.  There are tiny echogenic foci compatible with microlithiasis.  No focal mass lesion.  Normal arterial and venous blood flow.  Left testis:  3.5 x 1.8 x 2.8 cm.  Again, microlithiasis noted.  No focal mass lesion.  Normal arterial and venous blood flow.  Right epididymis:  Normal in size and appearance.  Left epididymis:  Normal in size and appearance.  Hydocele:  Absent  Varicocele:  Absent  Pulsed Doppler interrogation of both testes demonstrates low resistance flow bilaterally.  IMPRESSION: No evidence of testicular torsion.  Microlithiasis bilaterally without focal abnormality.  This may be related to increased incidence of testicular cancer.  Consider urologic consultation and sonographic surveillance.  Original Report Authenticated By: Cyndie Chime, M.D.   No diagnosis found.  Consult to Urology Dr. Mena Goes;  Recommendations below: Self Check  exams bc increase risk of CA, testicular Cipro x 7 days F-u with cystoscopy to further evaluate bladder.    MDM  Hematuria, periumbilical abdominal pain, Left Scrotum pain  Patient has been advised to stop taking ibuprofen and other NSAIDs.  Recommended again followup with urology for hematuria. Patient will be given pain medication and nausea medication. Pain managed in the ED. Pt agreeable with plan.      Jaci Carrel, [place] 02/12/12 2336

## 2012-02-12 NOTE — ED Notes (Signed)
Pt reports having abdominal pain intermittently x several years.  States that he has recently had constant pain, blood in his semen and blood in his urine and stool.  States that he occasionally coughs up blood as well.  Pt reports increased pain with movement and palpation.  Pt also reports that he has tenderness in his testicles.  No distress noted.

## 2012-02-17 NOTE — ED Provider Notes (Signed)
Medical screening examination/treatment/procedure(s) were performed by non-physician practitioner and as supervising physician I was immediately available for consultation/collaboration.  Geoffery Lyons, MD 02/17/12 [phone]

## 2012-02-24 ENCOUNTER — Encounter (HOSPITAL_COMMUNITY): Payer: Self-pay | Admitting: *Deleted

## 2012-02-24 DIAGNOSIS — R109 Unspecified abdominal pain: Secondary | ICD-10-CM | POA: Insufficient documentation

## 2012-02-24 DIAGNOSIS — N390 Urinary tract infection, site not specified: Secondary | ICD-10-CM | POA: Insufficient documentation

## 2012-02-24 DIAGNOSIS — F17200 Nicotine dependence, unspecified, uncomplicated: Secondary | ICD-10-CM | POA: Insufficient documentation

## 2012-02-24 LAB — URINE MICROSCOPIC-ADD ON

## 2012-02-24 LAB — URINALYSIS, ROUTINE W REFLEX MICROSCOPIC
Protein, ur: 30 mg/dL — AB
Urobilinogen, UA: 1 mg/dL (ref 0.0–1.0)

## 2012-02-24 NOTE — ED Notes (Signed)
Patient with abdominal pain, back pain, blood in his urine.  Patient was here a few weeks ago and was told that he might have bladder cancer and to f/u with urologist but patient was not able to due to financial issues.  Patient's pain is worse and he wants to be evaluated

## 2012-02-25 ENCOUNTER — Emergency Department (HOSPITAL_COMMUNITY)
Admission: EM | Admit: 2012-02-25 | Discharge: 2012-02-25 | Disposition: A | Discharge status: Home / Self Care | Payer: Self-pay | Attending: Emergency Medicine | Admitting: Emergency Medicine

## 2012-02-25 DIAGNOSIS — N390 Urinary tract infection, site not specified: Secondary | ICD-10-CM

## 2012-02-25 DIAGNOSIS — R109 Unspecified abdominal pain: Secondary | ICD-10-CM

## 2012-02-25 MED ORDER — SULFAMETHOXAZOLE-TRIMETHOPRIM 800-160 MG PO TABS: 1.0000 | ORAL_TABLET | Freq: Two times a day (BID) | ORAL | Status: AC

## 2012-02-25 MED ORDER — OXYCODONE-ACETAMINOPHEN 5-325 MG PO TABS: 1.0000 | ORAL_TABLET | Freq: Four times a day (QID) | ORAL | Status: AC | PRN

## 2012-02-25 NOTE — ED Notes (Signed)
Patient presents with c/o lower abd pain, bloody urine, lower back pain.  Was seen here about 1 and half weeks ago for the same and was referred to a urologist and they would not see him unless he had $250.  Told to come back to the ED if he got worse.

## 2012-02-25 NOTE — ED Provider Notes (Signed)
History     CSN: [id number]  Arrival date & time 02/24/12  2215   First MD Initiated Contact with Patient 02/25/12 0037      Chief Complaint  Patient presents with  . Abdominal Pain    (Consider location/radiation/quality/duration/timing/severity/associated sxs/prior treatment) HPI Comments: Patient was seen here a few weeks back for similar symptoms.  He had a workup including an [place], ua, labs and discarged with follow up with Urology which he was unable to make due to financial issues.    Patient is a 31 y.o. male presenting with abdominal pain. The history is provided by the patient.  Abdominal Pain The primary symptoms of the illness include abdominal pain, nausea and dysuria. The primary symptoms of the illness do not include fever, vomiting or diarrhea. Episode onset: several weeks ago. The onset of the illness was gradual. The problem has been gradually worsening.  The dysuria is associated with hematuria and urgency.  The patient has not had a change in bowel habit. Additional symptoms associated with the illness include urgency and hematuria. Symptoms associated with the illness do not include chills.    Past Medical History  Diagnosis Date  . Prostatitis     History reviewed. No pertinent past surgical history.  History reviewed. No pertinent family history.  History  Substance Use Topics  . Smoking status: Current Some Day Smoker  . Smokeless tobacco: Not on file  . Alcohol Use: Yes      Review of Systems  Constitutional: Negative for fever and chills.  Gastrointestinal: Positive for nausea and abdominal pain. Negative for vomiting and diarrhea.  Genitourinary: Positive for dysuria, urgency and hematuria.  All other systems reviewed and are negative.    Allergies  Review of patient's allergies indicates no known allergies.  Home Medications   Current Outpatient Rx  Name Route Sig Dispense Refill  . IBUPROFEN 200 MG PO TABS Oral Take 800 mg by mouth  every 6 (six) hours as needed. For pain    . OXYCODONE-ACETAMINOPHEN 5-325 MG PO TABS Oral Take 1 tablet by mouth every 4 (four) hours as needed. For pain    . PROMETHAZINE HCL 25 MG PO TABS Oral Take 25 mg by mouth every 6 (six) hours as needed. For nausea      BP 132/84  Pulse 68  Temp 98.1 F (36.7 C) (Oral)  Resp 19  SpO2 99%  Physical Exam  Nursing note and vitals reviewed. Constitutional: He is oriented to person, place, and time. He appears well-developed and well-nourished. No distress.  HENT:  Head: Normocephalic and atraumatic.  Neck: Normal range of motion. Neck supple.  Cardiovascular:  No murmur heard. Pulmonary/Chest: Effort normal and breath sounds normal.  Abdominal: Soft. Bowel sounds are normal. He exhibits no distension. There is tenderness.       He is ttp in the suprapubic region.  There is no rebound or guarding.  Musculoskeletal: Normal range of motion.  Neurological: He is alert and oriented to person, place, and time.  Skin: Skin is warm. He is not diaphoretic.    ED Course  Procedures (including critical care time)  Labs Reviewed  URINALYSIS, ROUTINE W REFLEX MICROSCOPIC - Abnormal; Notable for the following:    APPearance TURBID (*)     Hgb urine dipstick LARGE (*)     Protein, ur 30 (*)     Nitrite POSITIVE (*)     Leukocytes, UA MODERATE (*)     All other components within normal limits  URINE MICROSCOPIC-ADD ON - Abnormal; Notable for the following:    Squamous Epithelial / LPF FEW (*)     Bacteria, UA FEW (*)     All other components within normal limits   No results found.   No diagnosis found.    MDM  The ua shows a uti.  Will treat with bactrim and percocet.  To follow up prn if he worsens.  I doubt there is any other emergent process.  Old chart reviewed a mentioned lithiasis on the scrotal US associated with increased risk for testicular cancer.  Advised to follow up with urology.        Geoffery Lyons, MD 02/25/12 [phone]

## 2012-04-03 ENCOUNTER — Encounter: Payer: Self-pay | Admitting: Family Medicine

## 2012-04-03 DIAGNOSIS — J302 Other seasonal allergic rhinitis: Secondary | ICD-10-CM | POA: Insufficient documentation

## 2012-04-03 DIAGNOSIS — G4733 Obstructive sleep apnea (adult) (pediatric): Secondary | ICD-10-CM | POA: Insufficient documentation

## 2012-04-03 DIAGNOSIS — Z8669 Personal history of other diseases of the nervous system and sense organs: Secondary | ICD-10-CM | POA: Insufficient documentation

## 2012-04-03 DIAGNOSIS — I10 Essential (primary) hypertension: Secondary | ICD-10-CM

## 2012-04-03 DIAGNOSIS — K219 Gastro-esophageal reflux disease without esophagitis: Secondary | ICD-10-CM | POA: Insufficient documentation

## 2012-04-03 HISTORY — DX: Essential (primary) hypertension: I10

## 2012-04-21 ENCOUNTER — Encounter: Payer: Self-pay | Admitting: Family Medicine

## 2012-04-21 ENCOUNTER — Ambulatory Visit: Payer: Self-pay | Admitting: Family Medicine

## 2012-04-21 DIAGNOSIS — Z87438 Personal history of other diseases of male genital organs: Secondary | ICD-10-CM | POA: Insufficient documentation

## 2012-06-18 ENCOUNTER — Observation Stay (HOSPITAL_COMMUNITY): Payer: Self-pay

## 2012-06-18 ENCOUNTER — Encounter (HOSPITAL_COMMUNITY): Payer: Self-pay | Admitting: Emergency Medicine

## 2012-06-18 ENCOUNTER — Observation Stay (HOSPITAL_COMMUNITY)
Admission: EM | Admit: 2012-06-18 | Discharge: 2012-06-18 | Disposition: A | Discharge status: Home / Self Care | Payer: Self-pay | Attending: Emergency Medicine | Admitting: Emergency Medicine

## 2012-06-18 DIAGNOSIS — Z87448 Personal history of other diseases of urinary system: Secondary | ICD-10-CM | POA: Insufficient documentation

## 2012-06-18 DIAGNOSIS — K921 Melena: Secondary | ICD-10-CM | POA: Insufficient documentation

## 2012-06-18 DIAGNOSIS — K625 Hemorrhage of anus and rectum: Secondary | ICD-10-CM | POA: Insufficient documentation

## 2012-06-18 DIAGNOSIS — F17200 Nicotine dependence, unspecified, uncomplicated: Secondary | ICD-10-CM | POA: Insufficient documentation

## 2012-06-18 DIAGNOSIS — I10 Essential (primary) hypertension: Secondary | ICD-10-CM | POA: Insufficient documentation

## 2012-06-18 DIAGNOSIS — R1110 Vomiting, unspecified: Secondary | ICD-10-CM | POA: Insufficient documentation

## 2012-06-18 DIAGNOSIS — R319 Hematuria, unspecified: Principal | ICD-10-CM | POA: Insufficient documentation

## 2012-06-18 DIAGNOSIS — R109 Unspecified abdominal pain: Secondary | ICD-10-CM

## 2012-06-18 LAB — URINE MICROSCOPIC-ADD ON

## 2012-06-18 LAB — BASIC METABOLIC PANEL
BUN: 8 mg/dL (ref 6–23)
CO2: 30 mEq/L (ref 19–32)
Chloride: 103 mEq/L (ref 96–112)
Creatinine, Ser: 0.92 mg/dL (ref 0.50–1.35)
GFR calc Af Amer: >90 mL/min (ref >90)
Glucose, Bld: 74 mg/dL (ref 70–99)

## 2012-06-18 LAB — CBC WITH DIFFERENTIAL/PLATELET
Basophils Relative: 0 % (ref 0–1)
HCT: 39.9 % (ref 39.0–52.0)
Hemoglobin: 13.6 g/dL (ref 13.0–17.0)
Lymphs Abs: 2.8 10*3/uL (ref 0.7–4.0)
MCH: 32.2 pg (ref 26.0–34.0)
MCHC: 34.1 g/dL (ref 30.0–36.0)
Monocytes Absolute: 0.5 10*3/uL (ref 0.1–1.0)
Monocytes Relative: 10 % (ref 3–12)
Neutro Abs: 2.1 10*3/uL (ref 1.7–7.7)

## 2012-06-18 LAB — URINALYSIS, ROUTINE W REFLEX MICROSCOPIC
Bilirubin Urine: NEGATIVE
Glucose, UA: NEGATIVE mg/dL
Ketones, ur: NEGATIVE mg/dL
pH: 7 (ref 5.0–8.0)

## 2012-06-18 MED ORDER — OMEPRAZOLE 20 MG PO CPDR: 20.0000 mg | DELAYED_RELEASE_CAPSULE | Freq: Every day | ORAL | Status: DC

## 2012-06-18 MED ORDER — ONDANSETRON HCL 4 MG/2ML IJ SOLN
4.0000 mg | Freq: Once | INTRAMUSCULAR | Status: AC
  Administered 2012-06-18: 4 mg via INTRAVENOUS
  Filled 2012-06-18: qty 2

## 2012-06-18 MED ORDER — MORPHINE SULFATE 4 MG/ML IJ SOLN
4.0000 mg | Freq: Once | INTRAMUSCULAR | Status: AC
  Administered 2012-06-18: 4 mg via INTRAVENOUS
  Filled 2012-06-18: qty 1

## 2012-06-18 MED ORDER — IOHEXOL 300 MG/ML  SOLN
80.0000 mL | Freq: Once | INTRAMUSCULAR | Status: AC | PRN
  Administered 2012-06-18: 80 mL via INTRAVENOUS

## 2012-06-18 NOTE — ED Notes (Signed)
Report to Oliver Hum- patient taken to CDU 06

## 2012-06-18 NOTE — ED Provider Notes (Signed)
History     CSN: [id number]  Arrival date & time 06/18/12  1018   First MD Initiated Contact with Patient 06/18/12 1153      Chief Complaint  Patient presents with  . Abdominal Pain  . Rectal Bleeding    (Consider location/radiation/quality/duration/timing/severity/associated sxs/prior treatment) HPI Comments: Eddie Smith 31 y.o. male   The chief complaint is: Patient presents with:   Abdominal Pain   Rectal Bleeding    Incarcerated- released on 10/31.  Patient had boil while he was in jail given pills- noticed blood in BM ever since the medication. Patient was given medication by nursing staff. Assumed that this medication was not authorized by Physician or midlevel provider.  He refused to take them after that. Blood in stool noticed on the 10/22. More blood than bm- coating the stool and and on TP.  Burning pain in BL abdomen, feels like "icy hot". Better with food.  Periumbilical. + hematuria. R flank pain. + one episode vomiting- saw some specks of blood.      Patient is a 31 y.o. male presenting with abdominal pain and hematochezia.  Abdominal Pain The primary symptoms of the illness include abdominal pain, vomiting and hematochezia. The primary symptoms of the illness do not include nausea, diarrhea or dysuria.  Additional symptoms associated with the illness include hematuria.  Rectal Bleeding  Associated symptoms include abdominal pain, vomiting and hematuria. Pertinent negatives include no diarrhea, no nausea and no rectal pain.    Past Medical History  Diagnosis Date  . Prostatitis   . Hypertension 04/03/2012    History reviewed. No pertinent past surgical history.  Family History  Problem Relation Age of Onset  . Asthma Brother   . Cancer Father   . Thyroid disease Mother   . Osteoarthritis Mother   . Hypertension Mother     History  Substance Use Topics  . Smoking status: Current Some Day Smoker  . Smokeless tobacco: Not on file  .  Alcohol Use: Yes      Review of Systems  Constitutional: Negative.   HENT: Negative.   Respiratory: Negative.   Cardiovascular: Negative.  Negative for palpitations.  Gastrointestinal: Positive for vomiting, abdominal pain, blood in stool and hematochezia. Negative for nausea, diarrhea and rectal pain.  Genitourinary: Positive for hematuria, flank pain and discharge. Negative for dysuria, decreased urine volume, penile swelling, scrotal swelling, enuresis, difficulty urinating, penile pain and testicular pain.  Musculoskeletal: Negative.   Neurological: Negative.   Psychiatric/Behavioral: Negative.   All other systems reviewed and are negative.    Allergies  Review of patient's allergies indicates no known allergies.  Home Medications   Current Outpatient Rx  Name  Route  Sig  Dispense  Refill  . IBUPROFEN 200 MG PO TABS   Oral   Take 800 mg by mouth every 6 (six) hours as needed. For pain           BP 115/89  Pulse 77  Temp 98.2 F (36.8 C) (Oral)  Resp 14  SpO2 100%  Physical Exam  Nursing note and vitals reviewed. Constitutional: He appears well-developed and well-nourished. No distress.  HENT:  Head: Normocephalic and atraumatic.  Eyes: Conjunctivae normal are normal. No scleral icterus.  Neck: Normal range of motion. Neck supple.  Cardiovascular: Normal rate, regular rhythm and normal heart sounds.   Pulmonary/Chest: Effort normal and breath sounds normal. No respiratory distress.  Abdominal: Soft. He exhibits no distension and no mass. There is tenderness (diffuse, mild). There  is no rebound and no guarding.  Genitourinary: Rectum normal, prostate normal and penis normal. Guaiac negative stool.  Musculoskeletal: He exhibits no edema.  Neurological: He is alert.  Skin: Skin is warm and dry. He is not diaphoretic.  Psychiatric: His behavior is normal.    ED Course  Procedures (including critical care time)  Labs Reviewed  CBC WITH DIFFERENTIAL -  Abnormal; Notable for the following:    Neutrophils Relative 38 (*)     Lymphocytes Relative 50 (*)     All other components within normal limits  BASIC METABOLIC PANEL   Ct Abdomen Pelvis W Contrast  06/18/2012  *RADIOLOGY REPORT*  Clinical Data: Generalized abdominal pain.  Hematuria.  Bloody stools.  CT ABDOMEN AND PELVIS WITH CONTRAST  Technique:  Multidetector CT imaging of the abdomen and pelvis was performed following the standard protocol during bolus administration of intravenous contrast.  Contrast: 80mL OMNIPAQUE IOHEXOL 300 MG/ML. Oral contrast was also administered.  Comparison: CT abdomen and pelvis 08/20/2011, 02/01/2011, 09/29/2010.  Findings: Normal appearing liver, spleen, pancreas, adrenal glands, and kidneys.  Gallbladder unremarkable by CT.  No biliary ductal dilation.  Stomach, small bowel, and colon unremarkable.  Normal appendix in the right upper pelvis No visible aorto-iliofemoral atherosclerosis. Widely patent visceral arteries.  No significant lymphadenopathy.  No free fluid.  Urinary bladder unremarkable. Prostate gland and seminal vesicles normal for age. Visualized lung bases clear.  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  IMPRESSION:  1.  Normal examination of the abdomen and pelvis. 2.  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   Original Report Authenticated By: Hulan Saas, M.D.      No diagnosis found.    MDM    Patient with gross, painless heamturia and abdominal pain. No evidence of infections. I have given report to PA Muthersbaugh who has assumed care of the patient. I Negative CT scan Patient may be discharged with Urologic f/u.       Arthor Captain, PA-C 06/18/12 1418  Arthor Captain, PA-C 06/18/12 1735

## 2012-06-18 NOTE — ED Notes (Signed)
Pain currently upper middle and RUQ pain 7-8/10 achy dull.

## 2012-06-18 NOTE — ED Notes (Signed)
Family at bedside. 

## 2012-06-18 NOTE — ED Notes (Signed)
Pt moved to room and changed into gown.  Warm blankets given, ready for provider assessment

## 2012-06-18 NOTE — ED Notes (Signed)
Pt sts general abd pain and blood in stool x several weeks when wiping; pt sts started while in jail receiving unknown meds

## 2012-06-18 NOTE — ED Provider Notes (Signed)
Patient placed in CDU awaiting abdominal CT scan by a meal here as, PA-C.  Patient is here for potential rectal bleed and abdominal pain and has received labs, imaging.  Patient re-evaluated and is playing of epigastric abdominal pain, VSS. On exam: hemodynamically stable, NAD, heart w/ RRR, lungs CTAB, Chest & abd non-tender, no peripheral edema or calf tenderness.   Plan per previous provider is to discharge home with follow up if the CT is negative.  Patient found to have painless hematuria therefore he will need a urology followup.  Abdominal CT was normal examination of the abdomen and pelvis.  Patient pain control. The patient tolerating by mouth fluids.  Patient is nontoxic, nonseptic appearing, in no apparent distress.  Patient's pain and other symptoms adequately managed in emergency department.  Fluid bolus given.  Labs, imaging and vitals reviewed.  Patient does not meet the SIRS or Sepsis criteria.  On repeat exam patient does not have a surgical abdomin and there are nor peritoneal signs.  No indication of appendicitis, bowel obstruction, bowel perforation, cholecystitis, diverticulitis. Patient discharged home with PPI and given strict instructions for follow-up with their primary care physician.  I have also discussed reasons to return immediately to the ER.  Patient expresses understanding and agrees with plan.   1. Medications: Omeprazole 2. Treatment: Rest, drink plenty of fluids 3. Follow Up: Urology and gastroenterology   Dierdre Forth, PA-C 06/19/12 0015

## 2012-06-18 NOTE — ED Notes (Signed)
Patient states "has been vomiting blood, passing blood in stool, and blood in urine".  He has been in jail x 1 month and refused to let doctor evaluate him due to not trusting them".  States "giving medications that he did not know what they were for while in jail and he thinks that's what's making him bleed".

## 2012-06-19 LAB — URINE CULTURE

## 2012-06-19 NOTE — ED Provider Notes (Signed)
Medical screening examination/treatment/procedure(s) were performed by non-physician practitioner and as supervising physician I was immediately available for consultation/collaboration.    Celene Kras, MD 06/19/12 [phone]

## 2012-06-23 NOTE — ED Provider Notes (Signed)
Medical screening examination/treatment/procedure(s) were performed by non-physician practitioner and as supervising physician I was immediately available for consultation/collaboration.  Flint Melter, MD 06/23/12 [phone]

## 2012-06-24 ENCOUNTER — Encounter: Payer: Self-pay | Admitting: Gastroenterology

## 2012-07-16 ENCOUNTER — Ambulatory Visit: Payer: Self-pay | Admitting: Gastroenterology

## 2012-12-03 ENCOUNTER — Encounter (HOSPITAL_COMMUNITY): Payer: Self-pay | Admitting: *Deleted

## 2012-12-03 ENCOUNTER — Emergency Department (HOSPITAL_COMMUNITY)
Admission: EM | Admit: 2012-12-03 | Discharge: 2012-12-04 | Disposition: A | Discharge status: Home / Self Care | Payer: Self-pay | Attending: Emergency Medicine | Admitting: Emergency Medicine

## 2012-12-03 ENCOUNTER — Emergency Department (HOSPITAL_COMMUNITY): Payer: Self-pay

## 2012-12-03 DIAGNOSIS — B9789 Other viral agents as the cause of diseases classified elsewhere: Secondary | ICD-10-CM | POA: Insufficient documentation

## 2012-12-03 DIAGNOSIS — R059 Cough, unspecified: Secondary | ICD-10-CM

## 2012-12-03 DIAGNOSIS — I10 Essential (primary) hypertension: Secondary | ICD-10-CM | POA: Insufficient documentation

## 2012-12-03 DIAGNOSIS — B349 Viral infection, unspecified: Secondary | ICD-10-CM

## 2012-12-03 DIAGNOSIS — R197 Diarrhea, unspecified: Secondary | ICD-10-CM | POA: Insufficient documentation

## 2012-12-03 DIAGNOSIS — F17200 Nicotine dependence, unspecified, uncomplicated: Secondary | ICD-10-CM | POA: Insufficient documentation

## 2012-12-03 DIAGNOSIS — J029 Acute pharyngitis, unspecified: Secondary | ICD-10-CM

## 2012-12-03 DIAGNOSIS — R110 Nausea: Secondary | ICD-10-CM | POA: Insufficient documentation

## 2012-12-03 DIAGNOSIS — N419 Inflammatory disease of prostate, unspecified: Secondary | ICD-10-CM | POA: Insufficient documentation

## 2012-12-03 DIAGNOSIS — IMO0001 Reserved for inherently not codable concepts without codable children: Secondary | ICD-10-CM | POA: Insufficient documentation

## 2012-12-03 DIAGNOSIS — R05 Cough: Secondary | ICD-10-CM | POA: Insufficient documentation

## 2012-12-03 LAB — BASIC METABOLIC PANEL WITH GFR
BUN: 8 mg/dL (ref 6–23)
CO2: 30 meq/L (ref 19–32)
Calcium: 9.7 mg/dL (ref 8.4–10.5)
Chloride: 96 meq/L (ref 96–112)
Creatinine, Ser: 0.81 mg/dL (ref 0.50–1.35)
GFR calc Af Amer: >90 mL/min
GFR calc non Af Amer: >90 mL/min
Glucose, Bld: 93 mg/dL (ref 70–99)
Potassium: 3.9 meq/L (ref 3.5–5.1)
Sodium: 137 meq/L (ref 135–145)

## 2012-12-03 LAB — CBC WITH DIFFERENTIAL/PLATELET
Basophils Absolute: 0 10*3/uL (ref 0.0–0.1)
Eosinophils Absolute: 0.2 10*3/uL (ref 0.0–0.7)
Eosinophils Relative: 2 % (ref 0–5)
MCH: 33.3 pg (ref 26.0–34.0)
MCV: 94.3 fL (ref 78.0–100.0)
Platelets: 223 10*3/uL (ref 150–400)
RDW: 13.7 % (ref 11.5–15.5)
WBC: 6.4 10*3/uL (ref 4.0–10.5)

## 2012-12-03 MED ORDER — DEXAMETHASONE SODIUM PHOSPHATE 10 MG/ML IJ SOLN
10.0000 mg | Freq: Once | INTRAMUSCULAR | Status: AC
  Administered 2012-12-03: 10 mg via INTRAVENOUS
  Filled 2012-12-03: qty 1

## 2012-12-03 MED ORDER — ACETAMINOPHEN 325 MG PO TABS
650.0000 mg | ORAL_TABLET | Freq: Once | ORAL | Status: AC
  Administered 2012-12-03: 650 mg via ORAL
  Filled 2012-12-03: qty 2

## 2012-12-03 MED ORDER — SODIUM CHLORIDE 0.9 % IV SOLN
1000.0000 mL | Freq: Once | INTRAVENOUS | Status: AC
  Administered 2012-12-03: 1000 mL via INTRAVENOUS

## 2012-12-03 MED ORDER — SODIUM CHLORIDE 0.9 % IV SOLN
1000.0000 mL | INTRAVENOUS | Status: DC
  Administered 2012-12-03: 1000 mL via INTRAVENOUS

## 2012-12-03 MED ORDER — KETOROLAC TROMETHAMINE 30 MG/ML IJ SOLN
30.0000 mg | Freq: Once | INTRAMUSCULAR | Status: AC
  Administered 2012-12-03: 30 mg via INTRAVENOUS
  Filled 2012-12-03: qty 1

## 2012-12-03 NOTE — ED Notes (Signed)
The pt is c/o total body aches with chest pain nv for 2-3 hours.  No distress

## 2012-12-03 NOTE — ED Notes (Signed)
Patient transported to X-ray 

## 2012-12-03 NOTE — ED Provider Notes (Signed)
History     CSN: [id number]  Arrival date & time 12/03/12  1807   First MD Initiated Contact with Patient 12/03/12 1938      Chief Complaint  Patient presents with  . multiple symptoms     (Consider location/radiation/quality/duration/timing/severity/associated sxs/prior treatment) The history is provided by the patient.  32 year old male had onset this afternoon of subjective fever, chills, sore throat, cough productive of yellowish sputum, nausea, diarrhea, and generalized bodyaches. Pain is moderate to severe and he rates it at 8/10. He denies any sick contacts. He did receive some ibuprofen which gave slight relief.  Past Medical History  Diagnosis Date  . Prostatitis   . Hypertension 04/03/2012    History reviewed. No pertinent past surgical history.  Family History  Problem Relation Age of Onset  . Asthma Brother   . Cancer Father   . Thyroid disease Mother   . Osteoarthritis Mother   . Hypertension Mother     History  Substance Use Topics  . Smoking status: Current Some Day Smoker  . Smokeless tobacco: Not on file  . Alcohol Use: Yes      Review of Systems  All other systems reviewed and are negative.    Allergies  Review of patient's allergies indicates no known allergies.  Home Medications   Current Outpatient Rx  Name  Route  Sig  Dispense  Refill  . aspirin EC 81 MG tablet   Oral   Take 81 mg by mouth daily.           BP 122/65  Pulse 87  Temp(Src) 99.9 F (37.7 C) (Oral)  Resp 18  SpO29 5% 32 year old male, resting comfortably and in no acute distress. Vital signs are normal. He feels warmer than the recorded temperature and coverage will be rechecked.. Oxygen saturation is 95%, which is normal. Head is normocephalic and atraumatic. PERRLA, EOMI. Oropharynxhas moderate erythema and slight edema of the uvula. He has no difficulty with secretions and phonation is normal. Neck is nontender and supple without adenopathy or JVD. Back is  nontender and there is no CVA tenderness. Lungs are clear without rales, wheezes, or rhonchi. Chest is nontender. Heart has regular rate and rhythm without murmur. Abdomen is soft, flat, nontender without masses or hepatosplenomegaly and peristalsis is normoactive. Extremities have no cyanosis or edema, full range of motion is present. Skin is warm and dry without rash. Neurologic: Mental status is normal, cranial nerves are intact, there are no motor or sensory deficits.  Physical Exam  Nursing note and vitals reviewed.   ED Course  Procedures (including critical care time)  Results for orders placed during the hospital encounter of 12/03/12  RAPID STREP SCREEN      Result Value Range   Streptococcus, Group A Screen (Direct) NEGATIVE  NEGATIVE  CBC WITH DIFFERENTIAL      Result Value Range   WBC 6.4  4.0 - 10.5 K/uL   RBC 4.92  4.22 - 5.81 MIL/uL   Hemoglobin 16.4  13.0 - 17.0 g/dL   HCT 78.2  95.6 - 21.3 %   MCV 94.3  78.0 - 100.0 fL   MCH 33.3  26.0 - 34.0 pg   MCHC 35.3  30.0 - 36.0 g/dL   RDW 08.6  57.8 - 46.9 %   Platelets 223  150 - 400 K/uL   Neutrophils Relative 59  43 - 77 %   Neutro Abs 3.8  1.7 - 7.7 K/uL   Lymphocytes Relative 20  12 - 46 %   Lymphs Abs 1.3  0.7 - 4.0 K/uL   Monocytes Relative 18 (*) 3 - 12 %   Monocytes Absolute 1.2 (*) 0.1 - 1.0 K/uL   Eosinophils Relative 2  0 - 5 %   Eosinophils Absolute 0.2  0.0 - 0.7 K/uL   Basophils Relative 1  0 - 1 %   Basophils Absolute 0.0  0.0 - 0.1 K/uL  BASIC METABOLIC PANEL      Result Value Range   Sodium 137  135 - 145 mEq/L   Potassium 3.9  3.5 - 5.1 mEq/L   Chloride 96  96 - 112 mEq/L   CO2 30  19 - 32 mEq/L   Glucose, Bld 93  70 - 99 mg/dL   BUN 8  6 - 23 mg/dL   Creatinine, Ser 1.61  0.50 - 1.35 mg/dL   Calcium 9.7  8.4 - 09.6 mg/dL   GFR calc non Af Amer >90  >90 mL/min   GFR calc Af Amer >90  >90 mL/min   Dg Chest 2 View  12/03/2012  *RADIOLOGY REPORT*  Clinical Data: Cough.  CHEST - 2 VIEW   Comparison: 02/01/2011.  Findings: The cardiac silhouette, mediastinal and hilar contours are normal and stable.  The lungs are clear.  No pleural effusion. The bony thorax is intact.  IMPRESSION: No acute cardiopulmonary findings.   Original Report Authenticated By: Rudie Meyer, M.D.       Date: 12/03/2012  Rate: 103  Rhythm: sinus tachycardia  QRS Axis: normal  Intervals: normal  ST/T Wave abnormalities: nonspecific T wave changes  Conduction Disutrbances:none  Narrative Interpretation: sinus tachycardia, left ventricular hypertrophy with secondary repolarization changes. No prior ECG available for comparison.  Old EKG Reviewed: none available    1. Viral syndrome   2. Pharyngitis   3. Cough       MDM  Symptom complex most consistent with a viral illness. Strep screen will be obtained as will chest x-ray. He'll be given IV fluidsand IV dexamethasone as well as oral acetaminophen.  He stated that he was still in pain following the above noted treatment. He was given a dose of ketorolac with excellent relief of pain. Workup is negative including negative strep screen is negative for chest x-ray. He apparently has a viral syndrome with pharyngitis and cough. He is discharged with instructions to use over-the-counter acetaminophen and ibuprofen as needed for fever and aching.  Dione Booze, MD 12/04/12 [phone]

## 2012-12-04 NOTE — ED Notes (Signed)
The patient is AOx4 and comfortable with his discharge instructions. 

## 2013-12-21 ENCOUNTER — Encounter (HOSPITAL_COMMUNITY): Payer: Self-pay | Admitting: Emergency Medicine

## 2013-12-21 ENCOUNTER — Emergency Department (HOSPITAL_COMMUNITY)
Admission: EM | Admit: 2013-12-21 | Discharge: 2013-12-21 | Disposition: A | Discharge status: Home / Self Care | Payer: PRIVATE HEALTH INSURANCE | Attending: Emergency Medicine | Admitting: Emergency Medicine

## 2013-12-21 ENCOUNTER — Emergency Department (HOSPITAL_COMMUNITY): Payer: PRIVATE HEALTH INSURANCE

## 2013-12-21 DIAGNOSIS — Y990 Civilian activity done for income or pay: Secondary | ICD-10-CM | POA: Insufficient documentation

## 2013-12-21 DIAGNOSIS — I10 Essential (primary) hypertension: Secondary | ICD-10-CM | POA: Insufficient documentation

## 2013-12-21 DIAGNOSIS — X500XXA Overexertion from strenuous movement or load, initial encounter: Secondary | ICD-10-CM | POA: Insufficient documentation

## 2013-12-21 DIAGNOSIS — Y9289 Other specified places as the place of occurrence of the external cause: Secondary | ICD-10-CM | POA: Insufficient documentation

## 2013-12-21 DIAGNOSIS — Z87448 Personal history of other diseases of urinary system: Secondary | ICD-10-CM | POA: Insufficient documentation

## 2013-12-21 DIAGNOSIS — R296 Repeated falls: Secondary | ICD-10-CM | POA: Insufficient documentation

## 2013-12-21 DIAGNOSIS — S8990XA Unspecified injury of unspecified lower leg, initial encounter: Secondary | ICD-10-CM | POA: Insufficient documentation

## 2013-12-21 DIAGNOSIS — F17200 Nicotine dependence, unspecified, uncomplicated: Secondary | ICD-10-CM | POA: Insufficient documentation

## 2013-12-21 DIAGNOSIS — Z7982 Long term (current) use of aspirin: Secondary | ICD-10-CM | POA: Insufficient documentation

## 2013-12-21 DIAGNOSIS — S99929A Unspecified injury of unspecified foot, initial encounter: Principal | ICD-10-CM

## 2013-12-21 DIAGNOSIS — S8991XA Unspecified injury of right lower leg, initial encounter: Secondary | ICD-10-CM

## 2013-12-21 DIAGNOSIS — Y9343 Activity, gymnastics: Secondary | ICD-10-CM | POA: Insufficient documentation

## 2013-12-21 DIAGNOSIS — S99919A Unspecified injury of unspecified ankle, initial encounter: Principal | ICD-10-CM

## 2013-12-21 MED ORDER — TRAMADOL HCL 50 MG PO TABS: 50.0000 mg | ORAL_TABLET | Freq: Four times a day (QID) | ORAL | Status: DC | PRN

## 2013-12-21 NOTE — ED Notes (Signed)
Reports pain to right knee x 2 days, unsure of injury. Ambulatory at triage.

## 2013-12-21 NOTE — ED Provider Notes (Signed)
Medical screening examination/treatment/procedure(s) were performed by non-physician practitioner and as supervising physician I was immediately available for consultation/collaboration.    Shanna CiscoMegan E Docherty, MD 12/21/13 2155

## 2013-12-21 NOTE — ED Notes (Signed)
Patient states he was doing cartwheels at work and when he landed, "something popped".

## 2013-12-21 NOTE — Discharge Instructions (Signed)
Take Tramadol as needed for pain. Wear knee sleeve for support. Follow up with Dr. Ophelia CharterYates for further evaluation of your knee if symptoms do not improve in 1 week.

## 2013-12-21 NOTE — ED Provider Notes (Signed)
CSN: [id number]     Arrival date & time 12/21/13  1048 History  This chart was scribed for non-physician practitioner working with Shanna CiscoMegan E Docherty, MD, by Jarvis Morganaylor Ferguson, ED Scribe. This patient was seen in room TR06C/TR06C and the patient's care was started at 11:13 AM.   Chief Complaint  Patient presents with  . Knee Pain    Patient is a 33 y.o. male presenting with knee pain. The history is provided by the patient. No language interpreter was used.  Knee Pain Location:  Knee Time since incident:  2 days Knee location:  R knee Pain details:    Radiates to:  Does not radiate   Severity:  Mild   Onset quality:  Sudden   Duration:  2 days   Progression:  Waxing and waning Chronicity:  New Foreign body present:  No foreign bodies Prior injury to area:  No Relieved by:  Nothing Ineffective treatments: Advil. Associated symptoms: no decreased ROM    HPI Comments: Eddie Smith is a 33 y.o. male who presents to the Emergency Department complaining of waxing and waning, mild, right knee pain onset two days ago. Patient states he was at work when the pain started but he is unsure if an injury occurred. Patient states that he is able to ambulate. Patient states that he has been taking Advil at home for the pain with no relief.   PCP: None  Past Medical History  Diagnosis Date  . Prostatitis   . Hypertension 04/03/2012   History reviewed. No pertinent past surgical history. Family History  Problem Relation Age of Onset  . Asthma Brother   . Cancer Father   . Thyroid disease Mother   . Osteoarthritis Mother   . Hypertension Mother    History  Substance Use Topics  . Smoking status: Current Some Day Smoker  . Smokeless tobacco: Not on file  . Alcohol Use: Yes    Review of Systems  Musculoskeletal: Positive for arthralgias (right knee pain). Negative for gait problem.  All other systems reviewed and are negative.     Allergies  Review of patient's allergies  indicates no known allergies.  Home Medications   Prior to Admission medications   Medication Sig Start Date End Date Taking? Authorizing Provider  aspirin EC 81 MG tablet Take 81 mg by mouth daily.    Historical Provider, MD   Triage Vitals: BP 114/69  Pulse 68  Temp(Src) 98.1 F (36.7 C) (Oral)  Resp 16  Ht 5\' 1"  (1.549 m)  Wt 160 lb (72.576 kg)  BMI 30.25 kg/m2  SpO2 98%  Physical Exam  Nursing note and vitals reviewed. Constitutional: He is oriented to person, place, and time. He appears well-developed and well-nourished. No distress.  HENT:  Head: Normocephalic and atraumatic.  Right Ear: External ear normal.  Left Ear: External ear normal.  Nose: Nose normal.  Mouth/Throat: Oropharynx is clear and moist.  Eyes: Conjunctivae are normal.  Neck: Normal range of motion. Neck supple.  Cardiovascular: Normal rate.   Pulmonary/Chest: Effort normal.  Abdominal: Soft.  Musculoskeletal: Normal range of motion.  Neurological: He is alert and oriented to person, place, and time.  Skin: Skin is warm and dry. He is not diaphoretic.  Psychiatric: He has a normal mood and affect.    ED Course  Procedures (including critical care time)  SPLINT APPLICATION Date/Time: 11:34 AM Authorized by: Emilia BeckKaitlyn Sharanya Templin Consent: Verbal consent obtained. Risks and benefits: risks, benefits and alternatives were discussed Consent given  by: patient Splint applied by: nurse Location details: right knee Splint type: knee splint Supplies used: knee sleeve Post-procedure: The splinted body part was neurovascularly unchanged following the procedure. Patient tolerance: Patient tolerated the procedure well with no immediate complications.     DIAGNOSTIC STUDIES: Oxygen Saturation is 98% on RA, normal by my interpretation.    COORDINATION OF CARE: 10:55 AM- Will order diagnostic imaging of right knee. Pt advised of plan for treatment and pt agrees.  11:18 AM- Will discharge with Ultram to  take every 6 hours, PRN.  Pt advised of plan for treatment and pt agrees.    Labs Review Labs Reviewed - No data to display  Imaging Review Dg Knee Complete 4 Views Right  12/21/2013   CLINICAL DATA:  Pain ; history of prior trauma  EXAM: RIGHT KNEE - COMPLETE 4+ VIEW  COMPARISON:  None.  FINDINGS: Frontal, lateral, and bilateral oblique views were obtained. There is no fracture, dislocation, or effusion. Joint spaces appear intact. No erosive change.  IMPRESSION: No abnormality noted.   Electronically Signed   By: Bretta BangWilliam  Woodruff M.D.   On: 12/21/2013 11:12     EKG Interpretation None      MDM   Final diagnoses:  Right knee injury   11:32 AM Patient's xray unremarkable for acute changes. Patient will have knee sleeve for support and Tramadol for pain. Patient advised to follow up with Dr. Ophelia CharterYates in 1 week if symptoms do not improve. No other injury.    I personally performed the services described in this documentation, which was scribed in my presence. The recorded information has been reviewed and is accurate.     Emilia BeckKaitlyn Laderius Valbuena, PA-C 12/21/13 1134

## 2013-12-24 NOTE — Discharge Planning (Signed)
[facility] Community Liaison did not get to see the patient, [facility] orange card information will be mailed to the address listed.  °  ° ° °

## 2014-06-20 ENCOUNTER — Emergency Department (HOSPITAL_COMMUNITY)
Admission: EM | Admit: 2014-06-20 | Discharge: 2014-06-21 | Disposition: A | Discharge status: Home / Self Care | Payer: PRIVATE HEALTH INSURANCE | Attending: Emergency Medicine | Admitting: Emergency Medicine

## 2014-06-20 ENCOUNTER — Encounter (HOSPITAL_COMMUNITY): Payer: Self-pay | Admitting: Emergency Medicine

## 2014-06-20 DIAGNOSIS — Z720 Tobacco use: Secondary | ICD-10-CM | POA: Insufficient documentation

## 2014-06-20 DIAGNOSIS — Z7982 Long term (current) use of aspirin: Secondary | ICD-10-CM | POA: Insufficient documentation

## 2014-06-20 DIAGNOSIS — B349 Viral infection, unspecified: Secondary | ICD-10-CM | POA: Insufficient documentation

## 2014-06-20 DIAGNOSIS — N410 Acute prostatitis: Secondary | ICD-10-CM | POA: Insufficient documentation

## 2014-06-20 DIAGNOSIS — I10 Essential (primary) hypertension: Secondary | ICD-10-CM | POA: Insufficient documentation

## 2014-06-20 LAB — URINALYSIS, ROUTINE W REFLEX MICROSCOPIC
Bilirubin Urine: NEGATIVE
Glucose, UA: NEGATIVE mg/dL
Ketones, ur: 15 mg/dL — AB
Nitrite: NEGATIVE
Protein, ur: NEGATIVE mg/dL
Specific Gravity, Urine: 1.023 (ref 1.005–1.030)
Urobilinogen, UA: 1 mg/dL (ref 0.0–1.0)
pH: 6 (ref 5.0–8.0)

## 2014-06-20 LAB — URINE MICROSCOPIC-ADD ON

## 2014-06-20 MED ORDER — NAPROXEN 250 MG PO TABS
500.0000 mg | ORAL_TABLET | Freq: Once | ORAL | Status: AC
  Administered 2014-06-20: 500 mg via ORAL
  Filled 2014-06-20: qty 2

## 2014-06-20 MED ORDER — DOXYCYCLINE HYCLATE 100 MG PO CAPS: 100.0000 mg | ORAL_CAPSULE | Freq: Two times a day (BID) | ORAL | Status: AC

## 2014-06-20 MED ORDER — SODIUM CHLORIDE 0.9 % IV BOLUS (SEPSIS)
1000.0000 mL | Freq: Once | INTRAVENOUS | Status: AC
  Administered 2014-06-20: 1000 mL via INTRAVENOUS

## 2014-06-20 MED ORDER — NAPROXEN 500 MG PO TABS: 500.0000 mg | ORAL_TABLET | Freq: Two times a day (BID) | ORAL | Status: DC

## 2014-06-20 MED ORDER — DEXTROSE 5 % IV SOLN
1.0000 g | Freq: Once | INTRAVENOUS | Status: AC
  Administered 2014-06-20: 1 g via INTRAVENOUS
  Filled 2014-06-20: qty 10

## 2014-06-20 MED ORDER — LIDOCAINE HCL (PF) 1 % IJ SOLN
2.0000 mL | Freq: Once | INTRAMUSCULAR | Status: DC
  Filled 2014-06-20: qty 5

## 2014-06-20 MED ORDER — CEFTRIAXONE SODIUM 250 MG IJ SOLR
250.0000 mg | Freq: Once | INTRAMUSCULAR | Status: DC
  Filled 2014-06-20: qty 250

## 2014-06-20 MED ORDER — ONDANSETRON 4 MG PO TBDP
8.0000 mg | ORAL_TABLET | Freq: Once | ORAL | Status: AC
  Administered 2014-06-20: 8 mg via ORAL
  Filled 2014-06-20: qty 2

## 2014-06-20 NOTE — ED Notes (Signed)
Gave pt a urinal, informed pt that we needed a urinal.

## 2014-06-20 NOTE — ED Provider Notes (Signed)
CSN: [id number]     Arrival date & time 06/20/14  1948 History   First MD Initiated Contact with Patient 06/20/14 2023     Chief Complaint  Patient presents with  . Generalized Body Aches     (Consider location/radiation/quality/duration/timing/severity/associated sxs/prior Treatment) HPI Comments: 33 y/o male with 2 days of generalized body aches, sore throat, cough with yellow sputum, nausea, and chills. Loose nonbloody stools x 4 today. Did not get flu shot this year. Able to tolerate PO. No focal pain. Also notes intermittent blood in urine and semen x 1 mo. Has been taking motrin without relief.   The history is provided by the patient. No language interpreter was used.    Past Medical History  Diagnosis Date  . Prostatitis   . Hypertension 04/03/2012   History reviewed. No pertinent past surgical history. Family History  Problem Relation Age of Onset  . Asthma Brother   . Cancer Father   . Thyroid disease Mother   . Osteoarthritis Mother   . Hypertension Mother    History  Substance Use Topics  . Smoking status: Current Some Day Smoker  . Smokeless tobacco: Not on file  . Alcohol Use: Yes    Review of Systems  Constitutional: Positive for chills and fatigue. Negative for fever.  HENT: Positive for sore throat. Negative for trouble swallowing and voice change.   Eyes: Negative for redness and visual disturbance.  Respiratory: Positive for cough. Negative for chest tightness and shortness of breath.   Cardiovascular: Negative for chest pain.  Gastrointestinal: Positive for nausea, vomiting, abdominal pain and diarrhea. Negative for abdominal distention.  Genitourinary: Positive for hematuria. Negative for dysuria, frequency, flank pain, discharge and scrotal swelling.  Musculoskeletal: Positive for myalgias. Negative for joint swelling and arthralgias.  Skin: Negative for rash.  Neurological: Positive for headaches.  All other systems reviewed and are  negative.     Allergies  Review of patient's allergies indicates no known allergies.  Home Medications   Prior to Admission medications   Medication Sig Start Date End Date Taking? Authorizing Provider  aspirin EC 81 MG tablet Take 81 mg by mouth daily.   Yes Historical Provider, MD  traMADol (ULTRAM) 50 MG tablet Take 1 tablet (50 mg total) by mouth every 6 (six) hours as needed. Patient not taking: Reported on 06/20/2014 12/21/13   Emilia BeckKaitlyn Szekalski, PA-C   BP 113/75 mmHg  Pulse 73  Temp(Src) 98.4 F (36.9 C) (Oral)  Resp 20  Ht 5\' 1"  (1.549 m)  Wt 152 lb (68.947 kg)  BMI 28.74 kg/m2  SpO2 100% Physical Exam  Constitutional: He is oriented to person, place, and time. He appears well-developed and well-nourished. No distress.  HENT:  Head: Normocephalic.  Mouth/Throat: Oropharynx is clear and moist.  Eyes: Conjunctivae are normal. Pupils are equal, round, and reactive to light.  Neck: Normal range of motion. Neck supple.  Cardiovascular: Normal rate, regular rhythm and normal heart sounds.   Pulmonary/Chest: Effort normal and breath sounds normal. No stridor.  Abdominal: Soft. Bowel sounds are normal. He exhibits no distension. There is tenderness (diffuse). There is no rebound and no guarding.  Genitourinary: Right testis shows no mass, no swelling and no tenderness. Left testis shows no mass, no swelling and no tenderness. No discharge found.  Musculoskeletal: Normal range of motion.  No joint swelling. Diffuse muscular tenderness  Lymphadenopathy:    He has cervical adenopathy.  Neurological: He is alert and oriented to person, place, and time.  Skin:  Skin is warm. No rash noted.  Vitals reviewed.   ED Course  Procedures (including critical care time) Labs Review Labs Reviewed  URINALYSIS, ROUTINE W REFLEX MICROSCOPIC - Abnormal; Notable for the following:    APPearance CLOUDY (*)    Hgb urine dipstick MODERATE (*)    Ketones, ur 15 (*)    Leukocytes, UA SMALL  (*)    All other components within normal limits  URINE MICROSCOPIC-ADD ON - Abnormal; Notable for the following:    Squamous Epithelial / LPF MANY (*)    Bacteria, UA FEW (*)    All other components within normal limits  GC/CHLAMYDIA PROBE AMP  RPR  HIV ANTIBODY (ROUTINE TESTING)    Imaging Review No results found.   EKG Interpretation None      MDM   Final diagnoses:  Acute prostatitis  Viral illness    33 y/o male with generalized malaise, sore throat, nonbloody diarrhea. Throat normal in appearance with evidence of PTA, RPA, or ludwig's angina. Mild abdominal tenderness. Prostate tender as well. Reports blood intermittently in urine and semen x 1 mo. No h/o kidney stones. Overall, well appearing and tolerating PO. Lungs clear. Suspect viral syndrome but will also treat for prostatitis given symptoms and exam. GC/Chl/RPR/HIV pending but pt denies high risk sexual activity. Pt appropriate for d/c with PCP f/u if symptoms persist.     Abagail KitchensMegan Alek Poncedeleon, MD 06/22/14 1930  Enid SkeensJoshua M Zavitz, MD 06/24/14 [phone]

## 2014-06-20 NOTE — ED Notes (Signed)
Patient reports generalized body aches with mild chills onset yesterday , denies fever , no cough or congestion . Respirations unlabored .

## 2014-06-21 LAB — GC/CHLAMYDIA PROBE AMP
CT Probe RNA: NEGATIVE
GC PROBE AMP APTIMA: NEGATIVE

## 2014-06-21 LAB — HIV ANTIBODY (ROUTINE TESTING W REFLEX): HIV: NONREACTIVE

## 2014-06-21 LAB — RPR

## 2014-06-21 NOTE — ED Notes (Signed)
Pt A&OX4, given 2 bus passes and for pt and his visitor, also given [place] sandwich and ginger ale. Pt ambulatory at d/c with steady gait, NAD

## 2014-09-29 ENCOUNTER — Emergency Department (HOSPITAL_COMMUNITY): Payer: PRIVATE HEALTH INSURANCE

## 2014-09-29 ENCOUNTER — Encounter (HOSPITAL_COMMUNITY): Payer: Self-pay | Admitting: Emergency Medicine

## 2014-09-29 ENCOUNTER — Emergency Department (HOSPITAL_COMMUNITY)
Admission: EM | Admit: 2014-09-29 | Discharge: 2014-09-29 | Disposition: A | Discharge status: Home / Self Care | Payer: PRIVATE HEALTH INSURANCE | Attending: Emergency Medicine | Admitting: Emergency Medicine

## 2014-09-29 DIAGNOSIS — Z7982 Long term (current) use of aspirin: Secondary | ICD-10-CM | POA: Insufficient documentation

## 2014-09-29 DIAGNOSIS — Y990 Civilian activity done for income or pay: Secondary | ICD-10-CM | POA: Insufficient documentation

## 2014-09-29 DIAGNOSIS — S5001XA Contusion of right elbow, initial encounter: Secondary | ICD-10-CM | POA: Insufficient documentation

## 2014-09-29 DIAGNOSIS — Z87438 Personal history of other diseases of male genital organs: Secondary | ICD-10-CM | POA: Insufficient documentation

## 2014-09-29 DIAGNOSIS — Z791 Long term (current) use of non-steroidal anti-inflammatories (NSAID): Secondary | ICD-10-CM | POA: Insufficient documentation

## 2014-09-29 DIAGNOSIS — Y9289 Other specified places as the place of occurrence of the external cause: Secondary | ICD-10-CM | POA: Insufficient documentation

## 2014-09-29 DIAGNOSIS — Y9389 Activity, other specified: Secondary | ICD-10-CM | POA: Insufficient documentation

## 2014-09-29 DIAGNOSIS — I10 Essential (primary) hypertension: Secondary | ICD-10-CM | POA: Insufficient documentation

## 2014-09-29 DIAGNOSIS — Z720 Tobacco use: Secondary | ICD-10-CM | POA: Insufficient documentation

## 2014-09-29 DIAGNOSIS — W01198A Fall on same level from slipping, tripping and stumbling with subsequent striking against other object, initial encounter: Secondary | ICD-10-CM | POA: Insufficient documentation

## 2014-09-29 MED ORDER — IBUPROFEN 800 MG PO TABS: 800.0000 mg | ORAL_TABLET | Freq: Three times a day (TID) | ORAL | Status: DC

## 2014-09-29 MED ORDER — HYDROCODONE-ACETAMINOPHEN 5-325 MG PO TABS
2.0000 | ORAL_TABLET | Freq: Once | ORAL | Status: AC
  Administered 2014-09-29: 2 via ORAL
  Filled 2014-09-29: qty 2

## 2014-09-29 NOTE — Discharge Instructions (Signed)
Elbow Contusion An elbow contusion is a deep bruise of the elbow. Contusions are the result of an injury that caused bleeding under the skin. The contusion may turn blue, purple, or yellow. Minor injuries will give you a painless contusion, but more severe contusions may stay painful and swollen for a few weeks.  CAUSES  An elbow contusion comes from a direct force to that area, such as falling on the elbow. SYMPTOMS   Swelling and redness of the elbow.  Bruising of the elbow area.  Tenderness or soreness of the elbow. DIAGNOSIS  You will have a physical exam and will be asked about your history. You may need an X-ray of your elbow to look for a broken bone (fracture).  TREATMENT  A sling or splint may be needed to support your injury. Resting, elevating, and applying cold compresses to the elbow area are often the best treatments for an elbow contusion. Over-the-counter medicines may also be recommended for pain control. HOME CARE INSTRUCTIONS   Put ice on the injured area.  Put ice in a plastic bag.  Place a towel between your skin and the bag.  Leave the ice on for 15-20 minutes, 03-04 times a day.  Only take over-the-counter or prescription medicines for pain, discomfort, or fever as directed by your caregiver.  Rest your injured elbow until the pain and swelling are better.  Elevate your elbow to reduce swelling.  Apply a compression wrap as directed by your caregiver. This can help reduce swelling and motion. You may remove the wrap for sleeping, showers, and baths. If your fingers become numb, cold, or blue, take the wrap off and reapply it more loosely.  Use your elbow only as directed by your caregiver. You may be asked to do range of motion exercises. Do them as directed.  See your caregiver as directed. It is very important to keep all follow-up appointments in order to avoid any long-term problems with your elbow, including chronic pain or inability to move your elbow  normally. SEEK IMMEDIATE MEDICAL CARE IF:   You have increased redness, swelling, or pain in your elbow.  Your swelling or pain is not relieved with medicines.  You have swelling of the hand and fingers.  You are unable to move your fingers or wrist.  You begin to lose feeling in your hand or fingers.  Your fingers or hand become cold or blue. MAKE SURE YOU:   Understand these instructions.  Will watch your condition.  Will get help right away if you are not doing well or get worse. Document Released: 07/07/2006 Document Revised: 10/21/2011 Document Reviewed: 06/14/2011 [facility] Patient Information 2015 [place], [place]. This information is not intended to replace advice given to you by your health care provider. Make sure you discuss any questions you have with your health care provider.   Emergency Department Resource Guide 1) Find a Doctor and Pay Out of Pocket Although you won't have to find out who is covered by your insurance plan, it is a good idea to ask around and get recommendations. You will then need to call the office and see if the doctor you have chosen will accept you as a new patient and what types of options they offer for patients who are self-pay. Some doctors offer discounts or will set up payment plans for their patients who do not have insurance, but you will need to ask so you aren't surprised when you get to your appointment.  2) Contact Your Local  Health Department Not all health departments have doctors that can see patients for sick visits, but many do, so it is worth a call to see if yours does. If you don't know where your local health department is, you can check in your phone book. The CDC also has a tool to help you locate your state's health department, and many state websites also have listings of all of their local health departments.  3) Find a Walk-in Clinic If your illness is not likely to be very severe or complicated, you may want to try a walk  in clinic. These are popping up all over the country in pharmacies, drugstores, and shopping centers. They're usually staffed by nurse practitioners or physician assistants that have been trained to treat common illnesses and complaints. They're usually fairly quick and inexpensive. However, if you have serious medical issues or chronic medical problems, these are probably not your best option.  No Primary Care Doctor: - Call Health Connect at  [phone] - they can help you locate a primary care doctor that  accepts your insurance, provides certain services, etc. - Physician Referral Service- [phone]  Chronic Pain Problems: Organization         Address  Phone   Notes  [facility]  [phone] Patients need to be referred by their primary care doctor.   Medication Assistance: Organization         Address  Phone   Notes  [facility] [place] Medication [facility] [address] [place] [place], [place] [address] [phone] --Must be a resident of [facility] -- Must have NO insurance coverage whatsoever (no Medicaid/ Medicare, etc.) -- The pt. MUST have a primary care doctor that directs their care regularly and follows them in the community   [facility]  [phone]   [employer]  [phone]    Agencies that provide inexpensive medical care: Organization         Address  Phone   Notes  [facility]  [phone]   [facility]    [phone]   [facility] [address] [place], [place] [address] [phone]   [facility] [address] [place], [place] [phone]   Planned Parenthood    [phone]   [facility]    [phone]   Community Health and [facility]  [address], [place] Phone:  [phone], Fax:  [phone] Hours of Operation:  9 am - 6 pm, M-F.  Also accepts Medicaid/Medicare and self-pay.  [facility]  [address], [place] Phone: [phone], Fax: [phone]. Hours of Operation:  8:30 am - 5:30 pm, M-F.  Also accepts Medicaid and self-pay.  [facility] [place] [address], [place] Phone: [phone]   [facility] [address] Natasha Bence [place], [place] [phone], Ext. [phone] Mondays & Thursdays: 7-9 AM.  First 15 patients are seen on a first come, first serve basis.    Medicaid-accepting [facility] Providers:  Organization         Address  Phone   Notes  [facility] [address], [place] [phone] Also accepts self-pay patients.  [facility] [address] Laurell Josephs [place], [place]  [phone]   [facility] [address], [place] [phone]  [facility] [address] [place] [address], [place] [phone]   Renaye RakersVeita Bland [address], [place]   [phone] Only accepts [place] Access [place] patients after they have their name applied to their card.   Self-Pay (no insurance) in [facility]:  Organization         Address  Phone   Notes  Sickle Cell Patients, [facility] [address], [place] [phone]   [facility] [address], [place] [phone]) [phone]   [facility] [place]  [address], [place] [phone]) [phone]   [facility]/Dr. Osei-Bonsu  [address] [place] [address], [place] or [phone], [place] [phone]) [phone] Phone number for both [place] and [place] locations is the same.  [facility] [address], [place] [phone]) [phone]   [facility]  [address] [place] [address], [place] or [address] [phone]   [facility] [address], [place] [phone]) [phone], phone; [phone]) [phone], fax Sees  patients 1st and 3rd Saturday of every month.  Must not qualify for public or private insurance (i.e. Medicaid, Medicare, [place] Health Choice, Veterans' Benefits)  Household income should be no more than 200% of the poverty level The clinic cannot treat you if you are pregnant or think you are pregnant  Sexually transmitted diseases are not treated at the clinic.    Dental Care: Organization         Address  Phone  Notes  [facility] [address], [place] [phone] Accepts children up to age 34 who are enrolled in [place] or [place] Health Choice; pregnant women with a Medicaid card; and children who have applied for Medicaid or [place] Health Choice, but were declined, whose parents can pay a reduced fee at time of service.  [facility] [place]  [address], [place] [phone] Accepts children up to age 34 who are enrolled in [place] or [place] Health Choice; pregnant women with a Medicaid card; and children who have applied for Medicaid or [place] Health Choice, but were declined, whose parents can pay a reduced fee at time of service.  [facility]  [address], [place] [phone]) [phone] Patients are seen by appointment only. Walk-ins are not accepted. [facility] will see patients 34 years of age and older. Monday - Tuesday (8am-5pm) Most Wednesdays (8:30-5pm) $30 per visit, cash only  [facility]  [address], [facility] [phone] Patients are seen by appointment only. Walk-ins are not accepted. [facility] will see patients 34 years of age and older. One Wednesday Evening (Monthly: Volunteer Based).  $30 per visit, cash only  [employer] [facility] [facility]  [phone] for adults; Children under age 584, call Graduate Pediatric Dentistry at [phone]. Children aged 294-14, please call [phone] to request a  pediatric application.  Dental services are provided in all areas of dental care including fillings, crowns and bridges, complete and partial dentures, implants, gum treatment, root canals, and extractions. Preventive care is also provided. Treatment is provided to both adults and children. Patients are selected via a lottery and there is often a waiting list.   [facility] [address], [place]  [phone] [email or web address]  [facility] [address] [place], [place] [phone], Ext. [phone] Second and Fourth Thursday of each month, opens at 6:30 AM; Clinic ends at 9 AM.  Patients are seen on a first-come first-served basis, and a limited number are seen during each clinic.   [facility]  [address] Hillard Danker [place], [place] [phone]   Eligibility Requirements You must have lived in [place], [place], or [place] counties for at least the last three months.   You cannot be eligible for state or federal sponsored [employer], including [employer], [place], or [employer].   You generally cannot be eligible for healthcare insurance through your employer.    How to apply: Eligibility screenings are held every Tuesday and Wednesday afternoon from 1:00 pm until 4:00 pm. You do not need an appointment for the interview!  [facility] [address] [place], [place]   [place]  [place] Department  [place]  [phone]    Behavioral Health Resources in the Community: Intensive Outpatient Programs Organization         Address  Phone  Notes  [place]. [address] [place], [place] [phone]   [facility] [address], [place], [place]   ADS: [facility] [address] [place], [place]   [place] [address]  [place], [place] or [phone]   Substance Abuse Resources Organization         Address  Phone  Notes  [facility] Drug Services  [phone]   [place]  [phone]   [facility] [place]   Chinita Pester  [phone]   Residential & Outpatient Substance Abuse Program  [phone]   Psychological Services Organization         Address  Phone  Notes  [facility] [place]  [place]  [phone]   [place] [address] [place] or [phone]    Mobile Crisis Teams Organization         Address  Phone  Notes  [facility], Mobile Crisis Care Unit  [phone]   [facility]  [address] [place], [place]   [place] Levels [address] [place] [phone]    Self-Help/Support Groups Organization         Address  Phone             Notes  [place]. of [place] - variety of support groups  [place] Call for more information  Narcotics Anonymous (NA), [facility] [address], [employer] [place]  2 meetings at this location   Special educational needs teacher         Address  Phone  Notes  [facility] Residential Treatment [place],    [place]  [phone]   [facility]  [address] [place] [address], [place], [place]   [place], [place] [phone] Admissions: 8am-3pm M-F  Incentives Substance [place] [address]    [place], [place] [phone]   [facility] [address] Jadene Pierini [place], [place]   The [place].,  [place], [place] - Intensive Outpatient [address]  [address] [place], [place] [phone]   [facility] (Addiction [facility] [address] [place]    [place], [place] [phone] or [phone]   [facility] (RTS) [address], [place], [place] [phone] Accepts Medicaid  [facility] [place] [address],  [place] [phone] Substance Abuse/Addiction Treatment   [facility] Organization         Address  Phone  Notes  [facility]  [phone]   Angie Fava, PhD [address] Ervin Knack [place], [place]   [phone] or [phone]   [facility]   [address] [place], [place] [phone]   [facility] [address] [place], [place] [phone] Insurance/Medicaid/sponsorship through [facility] Families [address]                                    [place], [place] [phone] Therapy/tele-psych/case  [facility] [address], [place] [phone]    Dr. Lolly Mustache  [phone]   [facility]  [employer] [facility] Dept. 1) [address], [place] 2) [address] [place] 3)  [address], [place] [phone]  [phone]   [facility] Child Abuse Hotline 301-550-5416 or [phone] (After Hours)

## 2014-09-29 NOTE — ED Provider Notes (Signed)
CSN: [id number]     Arrival date & time 09/29/14  1048 History  This chart was scribed for non-physician practitioner Jinny Sanders, PA-C, working with Juliet Rude. Rubin Payor, MD by Littie Deeds, ED Scribe. This patient was seen in room TR06C/TR06C and the patient's care was started at 12:05 PM.      Chief Complaint  Patient presents with  . Elbow Injury   The history is provided by the patient. No language interpreter was used.   HPI Comments: Eddie Smith is a 34 y.o. male who presents to the Emergency Department complaining of sudden onset right elbow pain with some swelling secondary to a fall that occurred at work yesterday. Patient states he tripped and fell backwards, landing on his elbow. He works for a [employer]. Patient denies numbness, weakness, tingling, loss of sensation or function.  Past Medical History  Diagnosis Date  . Prostatitis   . Hypertension 04/03/2012   History reviewed. No pertinent past surgical history. Family History  Problem Relation Age of Onset  . Asthma Brother   . Cancer Father   . Thyroid disease Mother   . Osteoarthritis Mother   . Hypertension Mother    History  Substance Use Topics  . Smoking status: Current Some Day Smoker  . Smokeless tobacco: Not on file  . Alcohol Use: Yes    Review of Systems  Constitutional: Negative for fever and chills.  Musculoskeletal: Positive for joint swelling and arthralgias.      Allergies  Review of patient's allergies indicates no known allergies.  Home Medications   Prior to Admission medications   Medication Sig Start Date End Date Taking? Authorizing Provider  aspirin EC 81 MG tablet Take 81 mg by mouth daily.    Historical Provider, MD  ibuprofen (ADVIL,MOTRIN) 800 MG tablet Take 1 tablet (800 mg total) by mouth 3 (three) times daily. 09/29/14   Monte Fantasia, PA-C  naproxen (NAPROSYN) 500 MG tablet Take 1 tablet (500 mg total) by mouth 2 (two) times daily. 06/20/14   Abagail Kitchens, MD   traMADol (ULTRAM) 50 MG tablet Take 1 tablet (50 mg total) by mouth every 6 (six) hours as needed. Patient not taking: Reported on 06/20/2014 12/21/13   Emilia Beck, PA-C   BP 112/60 mmHg  Pulse 78  Temp(Src) 98.5 F (36.9 C) (Oral)  Resp 16  SpO2 100% Physical Exam  Constitutional: He is oriented to person, place, and time. He appears well-developed and well-nourished. No distress.  HENT:  Head: Normocephalic and atraumatic.  Mouth/Throat: Oropharynx is clear and moist. No oropharyngeal exudate.  Eyes: Pupils are equal, round, and reactive to light.  Neck: Neck supple.  Cardiovascular: Normal rate.   Pulmonary/Chest: Effort normal.  Musculoskeletal: He exhibits tenderness. He exhibits no edema.  Mild tenderness to right elbow with no obvious effusion, swelling, or ecchymoses. Mild swelling superior to elbow. Patient has full active and passive range of motion. Radial pulse 2+. Capillary refill less than 2 seconds. Distal sensation intact.  Neurological: He is alert and oriented to person, place, and time. No cranial nerve deficit.  5/5 motor strength in right shouler, right wrist and right elbow.  Skin: Skin is warm and dry. No rash noted.  Psychiatric: He has a normal mood and affect. His behavior is normal.  Nursing note and vitals reviewed.   ED Course  Procedures  DIAGNOSTIC STUDIES: Oxygen Saturation is 100% on room air, normal by my interpretation.    COORDINATION OF CARE: 12:09 PM-Discussed treatment  plan which includes pain medication with pt at bedside and pt agreed to plan.    Labs Review Labs Reviewed - No data to display  Imaging Review Dg Elbow Complete Right  09/29/2014   CLINICAL DATA:  Fall at work with posterior elbow pain in tenderness to touch. Initial encounter.  EXAM: RIGHT ELBOW - COMPLETE 3+ VIEW  COMPARISON:  None.  FINDINGS: There is no evidence of fracture, dislocation, or joint effusion. There is no evidence of arthropathy or other focal bone  abnormality. Soft tissues are unremarkable.  IMPRESSION: Negative.   Electronically Signed   By: Marnee SpringJonathon  Watts M.D.   On: 09/29/2014 12:40     EKG Interpretation None      MDM   Final diagnoses:  Elbow contusion, right, initial encounter    Patient here status post fall on his right elbow complaint of elbow pain. No obvious injury noted on exam. Patient neurovascularly intact. Radiographs unremarkable for any acute pathology. I personally reviewed radiographs and there is no evidence of occult radial head fracture. I offered patient sling for comfort, however patient denied. I encouraged RICE therapy and follow-up with orthopedics should his symptoms persist or worsen. I discussed return precautions with patient, and patient verbalizes understanding and agreement of this plan. I encouraged patient to call or return to the ER with any worsening of symptoms or should he have any questions or concerns.  I personally performed the services described in this documentation, which was scribed in my presence. The recorded information has been reviewed and is accurate.  BP 112/60 mmHg  Pulse 78  Temp(Src) 98.5 F (36.9 C) (Oral)  Resp 16  SpO2 100%  Signed,  Ladona MowJoe Shaka Cardin, PA-C 12:54 PM    Monte FantasiaJoseph W Sartaj Hoskin, PA-C 09/29/14 1254  Juliet RudeNathan R. Rubin PayorPickering, MD 09/29/14 1606

## 2014-09-29 NOTE — ED Notes (Signed)
While at work yesterday, pt tripped and fell onto right elbow. C/o pain, slight swelling.

## 2015-01-03 ENCOUNTER — Emergency Department (HOSPITAL_COMMUNITY): Payer: PRIVATE HEALTH INSURANCE

## 2015-01-03 ENCOUNTER — Encounter (HOSPITAL_COMMUNITY): Payer: Self-pay | Admitting: Emergency Medicine

## 2015-01-03 ENCOUNTER — Emergency Department (HOSPITAL_COMMUNITY)
Admission: EM | Admit: 2015-01-03 | Discharge: 2015-01-03 | Disposition: A | Discharge status: Home / Self Care | Payer: PRIVATE HEALTH INSURANCE | Attending: Emergency Medicine | Admitting: Emergency Medicine

## 2015-01-03 DIAGNOSIS — Z87448 Personal history of other diseases of urinary system: Secondary | ICD-10-CM | POA: Insufficient documentation

## 2015-01-03 DIAGNOSIS — M549 Dorsalgia, unspecified: Secondary | ICD-10-CM

## 2015-01-03 DIAGNOSIS — R0789 Other chest pain: Secondary | ICD-10-CM

## 2015-01-03 DIAGNOSIS — I10 Essential (primary) hypertension: Secondary | ICD-10-CM | POA: Insufficient documentation

## 2015-01-03 DIAGNOSIS — Z720 Tobacco use: Secondary | ICD-10-CM | POA: Insufficient documentation

## 2015-01-03 LAB — CBG MONITORING, ED: GLUCOSE-CAPILLARY: 127 mg/dL — AB (ref 65–99)

## 2015-01-03 LAB — URINE MICROSCOPIC-ADD ON

## 2015-01-03 LAB — URINALYSIS, ROUTINE W REFLEX MICROSCOPIC
Bilirubin Urine: NEGATIVE
Glucose, UA: NEGATIVE mg/dL
Hgb urine dipstick: NEGATIVE
Ketones, ur: NEGATIVE mg/dL
Nitrite: NEGATIVE
Protein, ur: NEGATIVE mg/dL
Specific Gravity, Urine: 1.022 (ref 1.005–1.030)
Urobilinogen, UA: 1 mg/dL (ref 0.0–1.0)
pH: 7.5 (ref 5.0–8.0)

## 2015-01-03 LAB — RAPID URINE DRUG SCREEN, HOSP PERFORMED
Amphetamines: NOT DETECTED
Barbiturates: NOT DETECTED
Benzodiazepines: NOT DETECTED
Cocaine: NOT DETECTED
Opiates: NOT DETECTED
Tetrahydrocannabinol: NOT DETECTED

## 2015-01-03 MED ORDER — METHOCARBAMOL 500 MG PO TABS: 500.0000 mg | ORAL_TABLET | Freq: Two times a day (BID) | ORAL | Status: DC

## 2015-01-03 MED ORDER — OXYCODONE-ACETAMINOPHEN 5-325 MG PO TABS
1.0000 | ORAL_TABLET | Freq: Once | ORAL | Status: AC
  Administered 2015-01-03: 1 via ORAL
  Filled 2015-01-03: qty 1

## 2015-01-03 MED ORDER — IBUPROFEN 600 MG PO TABS: 600.0000 mg | ORAL_TABLET | Freq: Four times a day (QID) | ORAL | Status: DC | PRN

## 2015-01-03 NOTE — Discharge Instructions (Signed)
Please monitor for new or worsening signs or symptoms, follow-up if any present. Please follow-up with [facility] if symptoms continue to persist

## 2015-01-03 NOTE — ED Notes (Signed)
POCT CBG resulted 127; [place], RN present in room

## 2015-01-03 NOTE — ED Provider Notes (Signed)
CSN: [id number]     Arrival date & time 01/03/15  4098 History   First MD Initiated Contact with Patient 01/03/15 1121     Chief Complaint  Patient presents with  . Shortness of Breath   Patient is a 34 y.o. male presenting with shortness of breath.  Shortness of Breath    34 year old male presents today with anterior and posterior chest wall pain. Patient reports that he recently had an upper respiratory infection that has resolved, but continues to have a "tightness" sensation throughout his chest. He reports that when he takes a deep breath the tightness increases and he has pain throughout his entire back and anterior chest wall. Patient reports the pain causes him to have decreased deep respirations, but denies significant air hunger or true shortness of breath. Patient reports that the back pain is positional, and requires him to sleep in different positions. Patient denies any significant trauma to his chest or back including increased activity, no current respiratory symptoms including rhinorrhea, cough, fever. Patient denies headache, nausea, vomiting, abdominal pain, changes in his bowel characteristics or frequency. Patient does note an increased thirst and a slight "burning sensation" when he uses the restroom. Denies discharge, blood, or history of STDs. Patient takes no medications at home, has not tried any over-the-counter medications for this. Patient denies any lower extremity swelling or edema, history of DVT PE, prolonged immobilization, fever, malignancy, estrogen use; he does report that he is a smoker, denies history of drug use, occasional alcohol use.    Past Medical History  Diagnosis Date  . Prostatitis   . Hypertension 04/03/2012   History reviewed. No pertinent past surgical history. Family History  Problem Relation Age of Onset  . Asthma Brother   . Cancer Father   . Thyroid disease Mother   . Osteoarthritis Mother   . Hypertension Mother    History   Substance Use Topics  . Smoking status: Current Some Day Smoker  . Smokeless tobacco: Not on file  . Alcohol Use: Yes    Review of Systems  All other systems reviewed and are negative.   Allergies  Review of patient's allergies indicates no known allergies.  Home Medications   Prior to Admission medications   Medication Sig Start Date End Date Taking? Authorizing Provider  ibuprofen (ADVIL,MOTRIN) 800 MG tablet Take 1 tablet (800 mg total) by mouth 3 (three) times daily. Patient not taking: Reported on 01/03/2015 09/29/14   Ladona Mow, PA-C  naproxen (NAPROSYN) 500 MG tablet Take 1 tablet (500 mg total) by mouth 2 (two) times daily. Patient not taking: Reported on 01/03/2015 06/20/14   Abagail Kitchens, MD  traMADol (ULTRAM) 50 MG tablet Take 1 tablet (50 mg total) by mouth every 6 (six) hours as needed. Patient not taking: Reported on 06/20/2014 12/21/13   Emilia Beck, PA-C   BP 99/67 mmHg  Pulse 84  Temp(Src) 98.2 F (36.8 C) (Oral)  Resp 18  Ht  (1.549 m)  Wt 139 lb (63.05 kg)  BMI 26.28 kg/m2  SpO2 97% Physical Exam  Constitutional: He is oriented to person, place, and time. He appears well-developed and well-nourished.  HENT:  Head: Normocephalic and atraumatic.  Eyes: Conjunctivae are normal. Pupils are equal, round, and reactive to light. Right eye exhibits no discharge. Left eye exhibits no discharge. No scleral icterus.  Neck: Normal range of motion. No JVD present. No tracheal deviation present.  Cardiovascular: Normal rate, regular rhythm, normal heart sounds and intact distal pulses.  Exam reveals no gallop and no friction rub.   No murmur heard. Pulmonary/Chest: Effort normal and breath sounds normal. No stridor. No respiratory distress. He has no wheezes. He has no rales. He exhibits no tenderness.  Abdominal: Soft. He exhibits no distension and no mass. There is no tenderness. There is no rebound and no guarding.  Musculoskeletal: Normal range of motion.   Patient tender to palpation of posterior and anterior chest wall, no signs of trauma, no obvious deformities, no signs of rash or infection.  Lower extremities equal bilateral no signs of swelling or edema  No C-spine T-spine and L-spine tenderness, no saddle anesthesia, no focal neurological deficits  Neurological: He is alert and oriented to person, place, and time. Coordination normal.  Skin: Skin is warm and dry.  Psychiatric: He has a normal mood and affect. His behavior is normal. Judgment and thought content normal.  Nursing note and vitals reviewed.   ED Course  Procedures (including critical care time) Labs Review Labs Reviewed  CBG MONITORING, ED - Abnormal; Notable for the following:    Glucose-Capillary 127 (*)    All other components within normal limits  URINALYSIS, ROUTINE W REFLEX MICROSCOPIC  URINE RAPID DRUG SCREEN (HOSP PERFORMED)    Imaging Review Dg Chest 2 View  01/03/2015   CLINICAL DATA:  Acute shortness of breath.  EXAM: CHEST  2 VIEW  COMPARISON:  December 03, 2012.  FINDINGS: The heart size and mediastinal contours are within normal limits. Both lungs are clear. No pneumothorax or pleural effusion is noted. The visualized skeletal structures are unremarkable.  IMPRESSION: No active cardiopulmonary disease.   Electronically Signed   By: Lupita RaiderJames  Green Jr, M.D.   On: 01/03/2015 09:34     EKG Interpretation None      MDM   Final diagnoses:  Chest wall pain  Back pain, unspecified location    Labs: CBG 127, urinalysis, urine drug screen- no significant findings  Imaging: DG chest 2 view no active cardiopulmonary disease  Consults: None  Therapeutics: Percocet  Assessment:chest wall and back pain  Plan: Pt presents with likely soft tissue chest wall and back pain. He is PERC neg with normal vitals signs. He has no signs on physical exam or imaging that would indicate respiratory nature. He has no sig cardiac risk factors other than smoking and is  not having ACS type chest pain or associated symptoms. No findings today that would indicate further diagnostic imaging or evaluation in the ED setting. Pt instructed to use OTC NSAIds and rest, contact PCP or [facility] for further evaluation and management. Strict return precautions given, pt verbalized his understanding and agreement to todays plan and assured his follow-up evaluation. All questions and concerns addressed.       Eyvonne MechanicJeffrey Zailyn Rowser, PA-C 01/05/15 1153  Purvis SheffieldForrest Harrison, MD 01/05/15 [phone]

## 2015-01-03 NOTE — ED Notes (Signed)
Pt here from home with c/o sob , pt feels like he cant take a deep breath ,

## 2015-01-16 ENCOUNTER — Emergency Department (HOSPITAL_COMMUNITY): Payer: PRIVATE HEALTH INSURANCE

## 2015-01-16 ENCOUNTER — Emergency Department (HOSPITAL_COMMUNITY)
Admission: EM | Admit: 2015-01-16 | Discharge: 2015-01-16 | Disposition: A | Discharge status: Home / Self Care | Payer: Self-pay | Attending: Emergency Medicine | Admitting: Emergency Medicine

## 2015-01-16 ENCOUNTER — Encounter (HOSPITAL_COMMUNITY): Payer: Self-pay | Admitting: Family Medicine

## 2015-01-16 DIAGNOSIS — R109 Unspecified abdominal pain: Secondary | ICD-10-CM

## 2015-01-16 DIAGNOSIS — K6289 Other specified diseases of anus and rectum: Secondary | ICD-10-CM | POA: Insufficient documentation

## 2015-01-16 DIAGNOSIS — K625 Hemorrhage of anus and rectum: Secondary | ICD-10-CM

## 2015-01-16 DIAGNOSIS — I10 Essential (primary) hypertension: Secondary | ICD-10-CM | POA: Insufficient documentation

## 2015-01-16 DIAGNOSIS — Z720 Tobacco use: Secondary | ICD-10-CM | POA: Insufficient documentation

## 2015-01-16 LAB — CBC
HEMATOCRIT: 46.2 % (ref 39.0–52.0)
HEMOGLOBIN: 15.8 g/dL (ref 13.0–17.0)
MCH: 33.4 pg (ref 26.0–34.0)
MCHC: 34.2 g/dL (ref 30.0–36.0)
MCV: 97.7 fL (ref 78.0–100.0)
Platelets: 239 10*3/uL (ref 150–400)
RBC: 4.73 MIL/uL (ref 4.22–5.81)
RDW: 13.1 % (ref 11.5–15.5)
WBC: 5.8 10*3/uL (ref 4.0–10.5)

## 2015-01-16 LAB — COMPREHENSIVE METABOLIC PANEL
ALT: 12 U/L — ABNORMAL LOW (ref 17–63)
AST: 25 U/L (ref 15–41)
Albumin: 4.1 g/dL (ref 3.5–5.0)
Alkaline Phosphatase: 49 U/L (ref 38–126)
Anion gap: 5 (ref 5–15)
BUN: 6 mg/dL (ref 6–20)
CALCIUM: 9.1 mg/dL (ref 8.9–10.3)
CO2: 29 mmol/L (ref 22–32)
CREATININE: 0.94 mg/dL (ref 0.61–1.24)
Chloride: 102 mmol/L (ref 101–111)
GFR calc non Af Amer: >60 mL/min (ref >60)
Glucose, Bld: 101 mg/dL — ABNORMAL HIGH (ref 65–99)
POTASSIUM: 4.3 mmol/L (ref 3.5–5.1)
Sodium: 136 mmol/L (ref 135–145)
Total Bilirubin: 0.9 mg/dL (ref 0.3–1.2)
Total Protein: 6.9 g/dL (ref 6.5–8.1)

## 2015-01-16 LAB — URINALYSIS, ROUTINE W REFLEX MICROSCOPIC
BILIRUBIN URINE: NEGATIVE
GLUCOSE, UA: NEGATIVE mg/dL
Hgb urine dipstick: NEGATIVE
KETONES UR: NEGATIVE mg/dL
Leukocytes, UA: NEGATIVE
NITRITE: NEGATIVE
PH: 8 (ref 5.0–8.0)
Protein, ur: NEGATIVE mg/dL
SPECIFIC GRAVITY, URINE: 1.01 (ref 1.005–1.030)
UROBILINOGEN UA: 0.2 mg/dL (ref 0.0–1.0)

## 2015-01-16 LAB — POC OCCULT BLOOD, ED: Fecal Occult Bld: POSITIVE — AB

## 2015-01-16 LAB — LIPASE, BLOOD: Lipase: 41 U/L (ref 22–51)

## 2015-01-16 MED ORDER — METRONIDAZOLE 500 MG PO TABS
500.0000 mg | ORAL_TABLET | Freq: Once | ORAL | Status: AC
  Administered 2015-01-16: 500 mg via ORAL
  Filled 2015-01-16: qty 1

## 2015-01-16 MED ORDER — ONDANSETRON HCL 4 MG/2ML IJ SOLN
4.0000 mg | Freq: Once | INTRAMUSCULAR | Status: AC
  Administered 2015-01-16: 4 mg via INTRAVENOUS
  Filled 2015-01-16: qty 2

## 2015-01-16 MED ORDER — IOHEXOL 300 MG/ML  SOLN
100.0000 mL | Freq: Once | INTRAMUSCULAR | Status: AC | PRN
  Administered 2015-01-16: 100 mL via INTRAVENOUS

## 2015-01-16 MED ORDER — CIPROFLOXACIN HCL 500 MG PO TABS: 500.0000 mg | ORAL_TABLET | Freq: Two times a day (BID) | ORAL | Status: DC

## 2015-01-16 MED ORDER — HYDROCODONE-ACETAMINOPHEN 5-325 MG PO TABS: 1.0000 | ORAL_TABLET | ORAL | Status: DC | PRN

## 2015-01-16 MED ORDER — CIPROFLOXACIN HCL 500 MG PO TABS
500.0000 mg | ORAL_TABLET | Freq: Once | ORAL | Status: AC
  Administered 2015-01-16: 500 mg via ORAL
  Filled 2015-01-16: qty 1

## 2015-01-16 MED ORDER — MORPHINE SULFATE 4 MG/ML IJ SOLN
4.0000 mg | Freq: Once | INTRAMUSCULAR | Status: AC
  Administered 2015-01-16: 4 mg via INTRAVENOUS
  Filled 2015-01-16: qty 1

## 2015-01-16 MED ORDER — METRONIDAZOLE 500 MG PO TABS: 500.0000 mg | ORAL_TABLET | Freq: Two times a day (BID) | ORAL | Status: DC

## 2015-01-16 MED ORDER — ONDANSETRON HCL 4 MG PO TABS: 4.0000 mg | ORAL_TABLET | Freq: Four times a day (QID) | ORAL | Status: DC

## 2015-01-16 MED ORDER — IOHEXOL 300 MG/ML  SOLN
25.0000 mL | Freq: Once | INTRAMUSCULAR | Status: AC | PRN
  Administered 2015-01-16: 25 mL via ORAL

## 2015-01-16 MED ORDER — SODIUM CHLORIDE 0.9 % IV BOLUS (SEPSIS)
1000.0000 mL | Freq: Once | INTRAVENOUS | Status: AC
Start: 2015-01-16 — End: 2015-01-16
  Administered 2015-01-16: 1000 mL via INTRAVENOUS

## 2015-01-16 NOTE — Discharge Instructions (Signed)
Proctitis °Proctitis is the swelling and soreness (inflammation) of the lining of the rectum. The rectum is at the end of the large intestine and is attached to the anus. The inflammation causes pain and discomfort. It may be short-term (acute) or long-lasting (chronic). °CAUSES °Inflammation in the rectum can be caused by many things, such as: °· Sexually transmitted diseases (STDs). °· Infection. °· Anal-rectal trauma or injury. °· Ulcerative colitis or Crohn's disease. °· Radiation therapy directed near the rectum. °· Antibiotic therapy. °SYMPTOMS °· Sudden, uncomfortable, and frequent urge to have a bowel movement. °· Anal or rectal pain. °· Abdominal cramping or pain. °· Sensation that the rectum is full. °· Rectal bleeding. °· Pus or mucus discharge from anus. °· Diarrhea or frequent soft, loose stools. °DIAGNOSIS °Diagnosis may include the following: °· A history and physical exam. °· An STD test. °· Blood tests. °· Stool tests. °· Rectal culture. °· A procedure to evaluate the anal canal (anoscopy). °· Procedures to look at part, or the entire large bowel (sigmoidoscopy, colonoscopy). °TREATMENT °Treatment of proctitis depends on the cause. Reducing the symptoms of inflammation and eliminating infection are the main goals of treatment. Treatment may include: °· Home remedies and lifestyle, such as sitz baths and avoiding food right before bedtime. °· Topical ointments, foams, suppositories, or enemas, such as corticosteroids or anti-inflammatories. °· Antibiotic or antiviral medicines to treat infection or to control harmful bacteria. °· Medicines to control diarrhea, soften stools, and reduce pain. °· Medicines to suppress the immune system. °· Avoiding the activity that caused rectal trauma. °· Nutritional, dietary, or herbal supplements. °· Heat or laser therapy for persistent bleeding. °· A dilation procedure to enlarge a narrowed rectum. °· Surgery, though rare, may be necessary to repair damaged rectal  lining. °HOME CARE INSTRUCTIONS °Only take medicines that are recommended or approved by your caregiver. Do not take anti-diarrhea medicine without your caregiver's approval. °SEEK MEDICAL CARE IF: °· You often experience one or more of the symptoms noted above. °· You keep experiencing symptoms after treatment. °· You have questions or concerns about your symptoms or treatment plan. °MAKE SURE YOU: °· Understand these instructions. °· Will watch your condition. °· Will get help right away if you are not doing well or get worse. °FOR MORE INFORMATION °National Institute of Diabetes and Digestive and Kidney Disease (NIDDK): www.digestive.niddk.hih.gov/ °Document Released: 07/18/2011 Document Revised: 11/23/2012 Document Reviewed: 07/18/2011 °Exit[employer]® Patient Information ©2015 [employer]. This information is not intended to replace advice given to you by your health care provider. Make sure you discuss any questions you have with your health care provider. ° °

## 2015-01-16 NOTE — ED Notes (Signed)
Pt here for burning in stomach and rectal bleeding. St LBM 3 days and normal. sts blood clots,

## 2015-01-16 NOTE — ED Notes (Signed)
Lab informed to add on Lipase.

## 2015-01-16 NOTE — ED Provider Notes (Signed)
CSN: [id number]     Arrival date & time 01/16/15  1015 History   First MD Initiated Contact with Patient 01/16/15 1217     Chief Complaint  Patient presents with  . Abdominal Pain  . Rectal Bleeding     (Consider location/radiation/quality/duration/timing/severity/associated sxs/prior Treatment) HPI   PCP: Default, Provider, MD Blood pressure 101/61, pulse 72, temperature 98.6 F (37 C), temperature source Oral, resp. rate 20, height  (1.549 m), weight 140 lb (63.504 kg), SpO2 98 %.  Eddie Smith is a 34 y.o.male with a significant PMH of prostatitis and hypertension presents to the ER with complaints of burning in his stomach and rectal bleeding.   He reports that the pain is in his RLQ, suprapubic and LLQ. His fiance who is present reports that he also has blood mixed in with his semen but no blood in his urine. He denies abnormal bleeding anywhere else. He was seen in 2013 for hematuria and rectal bleeding and referred to GI and Urology whom he never saw. He only passes blood with bowel movement or flatulence. The bleeding was at first BRB and blots but now he describes it as dark, foul smelling. Denies hemorrhoids or rectal pain.  The patient denies having any symptoms of weakness, nausea, vomiting, diarrhea, back pain, hematuria, dysuria, penile discharge, shortness of breath, chest pain, rectal pain, fatigue, fevers, weight loss, chills.   Past Medical History  Diagnosis Date  . Prostatitis   . Hypertension 04/03/2012   History reviewed. No pertinent past surgical history. Family History  Problem Relation Age of Onset  . Asthma Brother   . Cancer Father   . Thyroid disease Mother   . Osteoarthritis Mother   . Hypertension Mother    History  Substance Use Topics  . Smoking status: Current Some Day Smoker  . Smokeless tobacco: Not on file  . Alcohol Use: Yes    Review of Systems  10 Systems reviewed and are negative for acute change except as noted in the  HPI.    Allergies  Review of patient's allergies indicates no known allergies.  Home Medications   Prior to Admission medications   Medication Sig Start Date End Date Taking? Authorizing Provider  aspirin 325 MG tablet Take 650 mg by mouth every 4 (four) hours as needed (pain).   Yes Historical Provider, MD  ciprofloxacin (CIPRO) 500 MG tablet Take 1 tablet (500 mg total) by mouth 2 (two) times daily. 01/16/15   Marlon Pel, PA-C  HYDROcodone-acetaminophen (NORCO/VICODIN) 5-325 MG per tablet Take 1-2 tablets by mouth every 4 (four) hours as needed. 01/16/15   Fernie Grimm Neva Seat, PA-C  ibuprofen (ADVIL,MOTRIN) 600 MG tablet Take 1 tablet (600 mg total) by mouth every 6 (six) hours as needed. Patient not taking: Reported on 01/16/2015 01/03/15   Eyvonne Mechanic, PA-C  metroNIDAZOLE (FLAGYL) 500 MG tablet Take 1 tablet (500 mg total) by mouth 2 (two) times daily. 01/16/15   Ermias Tomeo Neva Seat, PA-C  ondansetron (ZOFRAN) 4 MG tablet Take 1 tablet (4 mg total) by mouth every 6 (six) hours. 01/16/15   Zhane Bluitt Neva Seat, PA-C  traMADol (ULTRAM) 50 MG tablet Take 1 tablet (50 mg total) by mouth every 6 (six) hours as needed. Patient not taking: Reported on 06/20/2014 12/21/13   Emilia Beck, PA-C   BP 95/59 mmHg  Pulse 56  Temp(Src) 98.6 F (37 C) (Oral)  Resp 20  Ht  (1.549 m)  Wt 140 lb (63.504 kg)  BMI 26.47 kg/m2  SpO2  98% Physical Exam  Constitutional: He appears well-developed and well-nourished. No distress.  HENT:  Head: Normocephalic and atraumatic.  Eyes: Pupils are equal, round, and reactive to light.  Neck: Normal range of motion. Neck supple.  Cardiovascular: Normal rate and regular rhythm.   Pulmonary/Chest: Effort normal.  Abdominal: Soft. Bowel sounds are normal. He exhibits no distension. There is tenderness (diffuse but worse in the bilateral lower quadrants and suprapubic region.). There is guarding. There is no rigidity, no rebound and no CVA tenderness.  Genitourinary: Rectal  exam shows no external hemorrhoid and no internal hemorrhoid. Guaiac positive stool (no gross BRB in rectum).  No hemorrhoids or gross bleeding noted to rectal vault  Neurological: He is alert.  Skin: Skin is warm and dry.  Nursing note and vitals reviewed.   ED Course  Procedures (including critical care time) Labs Review Labs Reviewed  COMPREHENSIVE METABOLIC PANEL - Abnormal; Notable for the following:    Glucose, Bld 101 (*)    ALT 12 (*)    All other components within normal limits  POC OCCULT BLOOD, ED - Abnormal; Notable for the following:    Fecal Occult Bld POSITIVE (*)    All other components within normal limits  CBC  URINALYSIS, ROUTINE W REFLEX MICROSCOPIC ([facility])  PROTIME-INR  LIPASE, BLOOD  GC/CHLAMYDIA PROBE AMP ([place]) NOT [facility]    Imaging Review Ct Abdomen Pelvis W Contrast  01/16/2015   CLINICAL DATA:  Abdominal pain.  Rectal bleeding.  EXAM: CT ABDOMEN AND PELVIS WITH CONTRAST  TECHNIQUE: Multidetector CT imaging of the abdomen and pelvis was performed using the standard protocol following bolus administration of intravenous contrast.  CONTRAST:  100mL OMNIPAQUE IOHEXOL 300 MG/ML  SOLN  COMPARISON:  06/18/2012  FINDINGS: Lower chest: Subsegmental atelectasis in the posterior basal segments of both lower lobes.  Hepatobiliary: Hepatic imaging occurs prior to hepatic vein opacification. The early contrast phase likely accounts for the slight hypodensity in heterogeneity in the liver. Gallbladder unremarkable. No biliary dilatation.  Pancreas: Unremarkable  Spleen: Unremarkable  Adrenals/Urinary Tract: 1.2 by 2.1 cm mass of the lateral limb of the left adrenal gland, probably an adenoma but technically nonspecific.  Stomach/Bowel: Mucosal enhancement in the lower half of the rectum may indicate low grade inflammation.  Vascular/Lymphatic: Unremarkable  Reproductive: Mildly heterogeneous prostate enhancement. Scattered vascular calcifications in the pelvis or  chronic.  Other: No supplemental non-categorized findings.  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  IMPRESSION: 1. Mucosal enhancement in the lower half of the rectum may reflect low grade inflammation. 2. Mildly heterogeneous prostate enhancement, probably incidental, although low-grade prostatitis could have a similar appearance. 3. 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 4. 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 5.   Electronically Signed   By: Gaylyn RongWalter  Liebkemann M.D.   On: 01/16/2015 15:16     EKG Interpretation None      MDM   Final diagnoses:  Abdominal pain  Proctitis  Rectal bleeding    UA is negative for infection CBC and CMP are unremarkable. Lipase and PT/INR were ordered but no performed by lab at the time of discharge  CT scan shows proctitis. I discussed the case with Dr. Roselyn BeringJ. Knapp, we will treat with Cipro and Flagyl - pt needs f/u with Urology and GI. Will give referral fir these services as well as pain/nausea medications. Patient only passes blood with  bowel movement and does not have active bleeding. Vital signs stable at time of discharge at 95/59- pt has been resting and baseline is systolic 95-105.  Medications  ciprofloxacin (CIPRO) tablet 500 mg (not administered)  metroNIDAZOLE (FLAGYL) tablet 500 mg (not administered)  sodium chloride 0.9 % bolus 1,000 mL (0 mLs Intravenous Stopped 01/16/15 1538)  morphine 4 MG/ML injection 4 mg (4 mg Intravenous Given 01/16/15 1316)  ondansetron (ZOFRAN) injection 4 mg (4 mg Intravenous Given 01/16/15 1316)  iohexol (OMNIPAQUE) 300 MG/ML solution 25 mL (25 mLs Oral Contrast Given 01/16/15 1354)  morphine 4 MG/ML injection 4 mg (4 mg Intravenous Given 01/16/15 1425)  iohexol (OMNIPAQUE) 300 MG/ML solution 100 mL (100 mLs Intravenous Contrast Given 01/16/15 1458)    33 y.o.Eddie Smith's evaluation in the Emergency Department is complete. It has been determined that no acute conditions requiring further emergency intervention are present at this time. The patient/guardian have been advised of the diagnosis and plan. We have discussed signs and symptoms that warrant return to the ED, such as changes or worsening in symptoms.  Vital signs are stable at discharge. Filed Vitals:   01/16/15 1530  BP: 95/59  Pulse: 56  Temp:   Resp: 20    Patient/guardian has voiced understanding and agreed to follow-up with the PCP or specialist.     Marlon Pel, PA-C 01/16/15 1540  Linwood Dibbles, MD 01/16/15 [phone]

## 2015-05-11 ENCOUNTER — Encounter (HOSPITAL_COMMUNITY): Payer: Self-pay | Admitting: Emergency Medicine

## 2015-05-11 ENCOUNTER — Emergency Department (HOSPITAL_COMMUNITY)
Admission: EM | Admit: 2015-05-11 | Discharge: 2015-05-11 | Disposition: A | Discharge status: Home / Self Care | Payer: PRIVATE HEALTH INSURANCE | Attending: Physician Assistant | Admitting: Physician Assistant

## 2015-05-11 ENCOUNTER — Emergency Department (HOSPITAL_COMMUNITY): Payer: PRIVATE HEALTH INSURANCE

## 2015-05-11 DIAGNOSIS — I10 Essential (primary) hypertension: Secondary | ICD-10-CM | POA: Insufficient documentation

## 2015-05-11 DIAGNOSIS — Z792 Long term (current) use of antibiotics: Secondary | ICD-10-CM | POA: Insufficient documentation

## 2015-05-11 DIAGNOSIS — B9789 Other viral agents as the cause of diseases classified elsewhere: Secondary | ICD-10-CM

## 2015-05-11 DIAGNOSIS — J069 Acute upper respiratory infection, unspecified: Secondary | ICD-10-CM

## 2015-05-11 DIAGNOSIS — Z720 Tobacco use: Secondary | ICD-10-CM | POA: Insufficient documentation

## 2015-05-11 DIAGNOSIS — Z87438 Personal history of other diseases of male genital organs: Secondary | ICD-10-CM | POA: Insufficient documentation

## 2015-05-11 MED ORDER — GUAIFENESIN-CODEINE 100-10 MG/5ML PO SOLN: 5.0000 mL | Freq: Four times a day (QID) | ORAL | Status: DC | PRN

## 2015-05-11 MED ORDER — IBUPROFEN 800 MG PO TABS: 800.0000 mg | ORAL_TABLET | Freq: Three times a day (TID) | ORAL | Status: DC

## 2015-05-11 MED ORDER — IBUPROFEN 800 MG PO TABS
800.0000 mg | ORAL_TABLET | Freq: Once | ORAL | Status: AC
  Administered 2015-05-11: 800 mg via ORAL
  Filled 2015-05-11: qty 1

## 2015-05-11 MED ORDER — BENZONATATE 100 MG PO CAPS: 100.0000 mg | ORAL_CAPSULE | Freq: Three times a day (TID) | ORAL | Status: DC | PRN

## 2015-05-11 NOTE — Discharge Instructions (Signed)
Upper Respiratory Infection, Adult °An upper respiratory infection (URI) is also sometimes known as the common cold. The upper respiratory tract includes the nose, sinuses, throat, trachea, and bronchi. Bronchi are the airways leading to the lungs. Most people improve within 1 week, but symptoms can last up to 2 weeks. A residual cough may last even longer.  °CAUSES °Many different viruses can infect the tissues lining the upper respiratory tract. The tissues become irritated and inflamed and often become very moist. Mucus production is also common. A cold is contagious. You can easily spread the virus to others by oral contact. This includes kissing, sharing a glass, coughing, or sneezing. Touching your mouth or nose and then touching a surface, which is then touched by another person, can also spread the virus. °SYMPTOMS  °Symptoms typically develop 1 to 3 days after you come in contact with a cold virus. Symptoms vary from person to person. They may include: °· Runny nose. °· Sneezing. °· Nasal congestion. °· Sinus irritation. °· Sore throat. °· Loss of voice (laryngitis). °· Cough. °· Fatigue. °· Muscle aches. °· Loss of appetite. °· Headache. °· Low-grade fever. °DIAGNOSIS  °You might diagnose your own cold based on familiar symptoms, since most people get a cold 2 to 3 times a year. Your caregiver can confirm this based on your exam. Most importantly, your caregiver can check that your symptoms are not due to another disease such as strep throat, sinusitis, pneumonia, asthma, or epiglottitis. Blood tests, throat tests, and X-rays are not necessary to diagnose a common cold, but they may sometimes be helpful in excluding other more serious diseases. Your caregiver will decide if any further tests are required. °RISKS AND COMPLICATIONS  °You may be at risk for a more severe case of the common cold if you smoke cigarettes, have chronic heart disease (such as heart failure) or lung disease (such as asthma), or if  you have a weakened immune system. The very young and very old are also at risk for more serious infections. Bacterial sinusitis, middle ear infections, and bacterial pneumonia can complicate the common cold. The common cold can worsen asthma and chronic obstructive pulmonary disease (COPD). Sometimes, these complications can require emergency medical care and may be life-threatening. °PREVENTION  °The best way to protect against getting a cold is to practice good hygiene. Avoid oral or hand contact with people with cold symptoms. Wash your hands often if contact occurs. There is no clear evidence that vitamin C, vitamin E, echinacea, or exercise reduces the chance of developing a cold. However, it is always recommended to get plenty of rest and practice good nutrition. °TREATMENT  °Treatment is directed at relieving symptoms. There is no cure. Antibiotics are not effective, because the infection is caused by a virus, not by bacteria. Treatment may include: °· Increased fluid intake. Sports drinks offer valuable electrolytes, sugars, and fluids. °· Breathing heated mist or steam (vaporizer or shower). °· Eating chicken soup or other clear broths, and maintaining good nutrition. °· Getting plenty of rest. °· Using gargles or lozenges for comfort. °· Controlling fevers with ibuprofen or acetaminophen as directed by your caregiver. °· Increasing usage of your inhaler if you have asthma. °Zinc gel and zinc lozenges, taken in the first 24 hours of the common cold, can shorten the duration and lessen the severity of symptoms. Pain medicines may help with fever, muscle aches, and throat pain. A variety of non-prescription medicines are available to treat congestion and runny nose. Your caregiver   can make recommendations and may suggest nasal or lung inhalers for other symptoms.  °HOME CARE INSTRUCTIONS  °· Only take over-the-counter or prescription medicines for pain, discomfort, or fever as directed by your  caregiver. °· Use a warm mist humidifier or inhale steam from a shower to increase air moisture. This may keep secretions moist and make it easier to breathe. °· Drink enough water and fluids to keep your urine clear or pale yellow. °· Rest as needed. °· Return to work when your temperature has returned to normal or as your caregiver advises. You may need to stay home longer to avoid infecting others. You can also use a face mask and careful hand washing to prevent spread of the virus. °SEEK MEDICAL CARE IF:  °· After the first few days, you feel you are getting worse rather than better. °· You need your caregiver's advice about medicines to control symptoms. °· You develop chills, worsening shortness of breath, or brown or red sputum. These may be signs of pneumonia. °· You develop yellow or brown nasal discharge or pain in the face, especially when you bend forward. These may be signs of sinusitis. °· You develop a fever, swollen neck glands, pain with swallowing, or white areas in the back of your throat. These may be signs of strep throat. °SEEK IMMEDIATE MEDICAL CARE IF:  °· You have a fever. °· You develop severe or persistent headache, ear pain, sinus pain, or chest pain. °· You develop wheezing, a prolonged cough, cough up blood, or have a change in your usual mucus (if you have chronic lung disease). °· You develop sore muscles or a stiff neck. °Document Released: 01/22/2001 Document Revised: 10/21/2011 Document Reviewed: 11/03/2013 °ExitCare® Patient Information ©2015 [employer]. This information is not intended to replace advice given to you by your health care provider. Make sure you discuss any questions you have with your health care provider. ° °Cough, Adult ° A cough is a reflex that helps clear your throat and airways. It can help heal the body or may be a reaction to an irritated airway. A cough may only last 2 or 3 weeks (acute) or may last more than 8 weeks (chronic).  °CAUSES °Acute  cough: °· Viral or bacterial infections. °Chronic cough: °· Infections. °· Allergies. °· Asthma. °· Post-nasal drip. °· Smoking. °· Heartburn or acid reflux. °· Some medicines. °· Chronic lung problems (COPD). °· Cancer. °SYMPTOMS  °· Cough. °· Fever. °· Chest pain. °· Increased breathing rate. °· High-pitched whistling sound when breathing (wheezing). °· Colored mucus that you cough up (sputum). °TREATMENT  °· A bacterial cough may be treated with antibiotic medicine. °· A viral cough must run its course and will not respond to antibiotics. °· Your caregiver may recommend other treatments if you have a chronic cough. °HOME CARE INSTRUCTIONS  °· Only take over-the-counter or prescription medicines for pain, discomfort, or fever as directed by your caregiver. Use cough suppressants only as directed by your caregiver. °· Use a cold steam vaporizer or humidifier in your bedroom or home to help loosen secretions. °· Sleep in a semi-upright position if your cough is worse at night. °· Rest as needed. °· Stop smoking if you smoke. °SEEK IMMEDIATE MEDICAL CARE IF:  °· You have pus in your sputum. °· Your cough starts to worsen. °· You cannot control your cough with suppressants and are losing sleep. °· You begin coughing up blood. °· You have difficulty breathing. °· You develop pain which is   getting worse or is uncontrolled with medicine. °· You have a fever. °MAKE SURE YOU:  °· Understand these instructions. °· Will watch your condition. °· Will get help right away if you are not doing well or get worse. °Document Released: 01/25/2011 Document Revised: 10/21/2011 Document Reviewed: 01/25/2011 °ExitCare® Patient Information ©2015 [employer]. This information is not intended to replace advice given to you by your health care provider. Make sure you discuss any questions you have with your health care provider. ° °

## 2015-05-11 NOTE — ED Notes (Signed)
Pt provided with bus pass.  Wife is upset we were unable to provide for the whole family.  This RN attempted to explain the reasoning behind our policy.

## 2015-05-11 NOTE — ED Notes (Signed)
Pt and family provided with [place] sandwiches.

## 2015-05-11 NOTE — ED Provider Notes (Signed)
CSN: [id number]     Arrival date & time 05/11/15  1545 History   First MD Initiated Contact with Patient 05/11/15 1824     Chief Complaint  Patient presents with  . Cough  . Generalized Body Aches     (Consider location/radiation/quality/duration/timing/severity/associated sxs/prior Treatment) HPI  Patient is a 34 year old male presenting with runny nose and body aches and cough. Mild fevers at home. Family members all have the same thing.  Patient tolerating by mouth normally. Patient is not tried anything at home to help. No history of asthma.  Past Medical History  Diagnosis Date  . Prostatitis   . Hypertension 04/03/2012   History reviewed. No pertinent past surgical history. Family History  Problem Relation Age of Onset  . Asthma Brother   . Cancer Father   . Thyroid disease Mother   . Osteoarthritis Mother   . Hypertension Mother    Social History  Substance Use Topics  . Smoking status: Current Some Day Smoker  . Smokeless tobacco: None  . Alcohol Use: Yes    Review of Systems  Constitutional: Negative for fever and activity change.  HENT: Positive for congestion, rhinorrhea and sinus pressure. Negative for hearing loss and sore throat.   Eyes: Negative for discharge and redness.  Respiratory: Positive for cough and chest tightness.   Cardiovascular: Negative for chest pain.  Gastrointestinal: Negative for abdominal pain.  Genitourinary: Negative for dysuria and urgency.  Musculoskeletal: Negative for arthralgias.  Allergic/Immunologic: Negative for immunocompromised state.  Neurological: Negative for seizures.  Psychiatric/Behavioral: Negative for behavioral problems and agitation.  All other systems reviewed and are negative.     Allergies  Review of patient's allergies indicates no known allergies.  Home Medications   Prior to Admission medications   Medication Sig Start Date End Date Taking? Authorizing Provider  aspirin 325 MG tablet Take 650  mg by mouth every 4 (four) hours as needed (pain).    Historical Provider, MD  ciprofloxacin (CIPRO) 500 MG tablet Take 1 tablet (500 mg total) by mouth 2 (two) times daily. 01/16/15   Marlon Pel, PA-C  HYDROcodone-acetaminophen (NORCO/VICODIN) 5-325 MG per tablet Take 1-2 tablets by mouth every 4 (four) hours as needed. 01/16/15   Tiffany Neva Seat, PA-C  ibuprofen (ADVIL,MOTRIN) 600 MG tablet Take 1 tablet (600 mg total) by mouth every 6 (six) hours as needed. Patient not taking: Reported on 01/16/2015 01/03/15   Eyvonne Mechanic, PA-C  metroNIDAZOLE (FLAGYL) 500 MG tablet Take 1 tablet (500 mg total) by mouth 2 (two) times daily. 01/16/15   Tiffany Neva Seat, PA-C  ondansetron (ZOFRAN) 4 MG tablet Take 1 tablet (4 mg total) by mouth every 6 (six) hours. 01/16/15   Tiffany Neva Seat, PA-C  traMADol (ULTRAM) 50 MG tablet Take 1 tablet (50 mg total) by mouth every 6 (six) hours as needed. Patient not taking: Reported on 06/20/2014 12/21/13   Kaitlyn Szekalski, PA-C   BP 101/59 mmHg  Pulse 93  Temp(Src) 98.2 F (36.8 C) (Oral)  Resp 18  SpO2 98% Physical Exam  Constitutional: He is oriented to person, place, and time. He appears well-nourished.  HENT:  Head: Normocephalic.  Mouth/Throat: Oropharynx is clear and moist.  No erythematous posterior pharynx, turbinates mildly erythematous.  Eyes: Conjunctivae are normal.  Neck: No tracheal deviation present.  Cardiovascular: Normal rate.   Pulmonary/Chest: Effort normal. No stridor. No respiratory distress.  Abdominal: Soft. There is no tenderness. There is no guarding.  Musculoskeletal: Normal range of motion. He exhibits no edema.  Neurological:  He is oriented to person, place, and time. No cranial nerve deficit.  Skin: Skin is warm and dry. No rash noted. He is not diaphoretic.  Psychiatric: He has a normal mood and affect. His behavior is normal.  Nursing note and vitals reviewed.   ED Course  Procedures (including critical care time) Labs Review Labs  Reviewed - No data to display  Imaging Review Dg Chest 2 View  05/11/2015   CLINICAL DATA:  Productive cough  EXAM: CHEST  2 VIEW  COMPARISON:  Jan 03, 2015  FINDINGS: Lungs are clear. Heart size and pulmonary vascularity are normal. No adenopathy. No bone lesions.  IMPRESSION: No abnormality noted.   Electronically Signed   By: Bretta Bang III M.D.   On: 05/11/2015 16:19   I have personally reviewed and evaluated these images and lab results as part of my medical decision-making.   EKG Interpretation None      MDM   Final diagnoses:  None   she is a 34 year old male with viral URI. We'll give him ibuprofen and symptomatically care home. Chest x-ray negative. Patient has  normal vital signs. Patient's entire family had the same thing.  Courteney Randall An, MD 05/11/15 1850

## 2015-05-11 NOTE — ED Notes (Signed)
Pt sts cough and body aches x 2 days  

## 2015-05-30 ENCOUNTER — Encounter (HOSPITAL_COMMUNITY): Payer: Self-pay | Admitting: Vascular Surgery

## 2015-05-30 ENCOUNTER — Emergency Department (HOSPITAL_COMMUNITY)
Admission: EM | Admit: 2015-05-30 | Discharge: 2015-05-31 | Disposition: A | Discharge status: Home / Self Care | Payer: PRIVATE HEALTH INSURANCE | Attending: Emergency Medicine | Admitting: Emergency Medicine

## 2015-05-30 DIAGNOSIS — I10 Essential (primary) hypertension: Secondary | ICD-10-CM | POA: Insufficient documentation

## 2015-05-30 DIAGNOSIS — N410 Acute prostatitis: Secondary | ICD-10-CM | POA: Insufficient documentation

## 2015-05-30 DIAGNOSIS — Z720 Tobacco use: Secondary | ICD-10-CM | POA: Insufficient documentation

## 2015-05-30 DIAGNOSIS — R1110 Vomiting, unspecified: Secondary | ICD-10-CM | POA: Insufficient documentation

## 2015-05-30 LAB — CBC
HEMATOCRIT: 46.6 % (ref 39.0–52.0)
HEMOGLOBIN: 15.4 g/dL (ref 13.0–17.0)
MCH: 33.6 pg (ref 26.0–34.0)
MCHC: 33 g/dL (ref 30.0–36.0)
MCV: 101.5 fL — AB (ref 78.0–100.0)
Platelets: 225 10*3/uL (ref 150–400)
RBC: 4.59 MIL/uL (ref 4.22–5.81)
RDW: 13.4 % (ref 11.5–15.5)
WBC: 7.2 10*3/uL (ref 4.0–10.5)

## 2015-05-30 LAB — URINALYSIS, ROUTINE W REFLEX MICROSCOPIC
Glucose, UA: 100 mg/dL — AB
Ketones, ur: 15 mg/dL — AB
NITRITE: NEGATIVE
PH: 7 (ref 5.0–8.0)
Protein, ur: 30 mg/dL — AB
SPECIFIC GRAVITY, URINE: 1.025 (ref 1.005–1.030)
Urobilinogen, UA: 1 mg/dL (ref 0.0–1.0)

## 2015-05-30 LAB — COMPREHENSIVE METABOLIC PANEL
ALT: 25 U/L (ref 17–63)
AST: 40 U/L (ref 15–41)
Albumin: 4 g/dL (ref 3.5–5.0)
Alkaline Phosphatase: 58 U/L (ref 38–126)
Anion gap: 9 (ref 5–15)
BUN: 5 mg/dL — AB (ref 6–20)
CHLORIDE: 98 mmol/L — AB (ref 101–111)
CO2: 28 mmol/L (ref 22–32)
Calcium: 9.5 mg/dL (ref 8.9–10.3)
Creatinine, Ser: 0.87 mg/dL (ref 0.61–1.24)
GFR calc Af Amer: >60 mL/min (ref >60)
Glucose, Bld: 106 mg/dL — ABNORMAL HIGH (ref 65–99)
Potassium: 4 mmol/L (ref 3.5–5.1)
Sodium: 135 mmol/L (ref 135–145)
Total Bilirubin: 0.9 mg/dL (ref 0.3–1.2)
Total Protein: 6.8 g/dL (ref 6.5–8.1)

## 2015-05-30 LAB — URINE MICROSCOPIC-ADD ON

## 2015-05-30 LAB — POC OCCULT BLOOD, ED: FECAL OCCULT BLD: NEGATIVE

## 2015-05-30 LAB — LIPASE, BLOOD: LIPASE: 37 U/L (ref 22–51)

## 2015-05-30 MED ORDER — DOXYCYCLINE HYCLATE 100 MG PO CAPS: 100.0000 mg | ORAL_CAPSULE | Freq: Two times a day (BID) | ORAL | Status: DC

## 2015-05-30 MED ORDER — HYDROCODONE-ACETAMINOPHEN 5-325 MG PO TABS: 1.0000 | ORAL_TABLET | ORAL | Status: DC | PRN

## 2015-05-30 MED ORDER — ONDANSETRON HCL 4 MG/2ML IJ SOLN
4.0000 mg | Freq: Once | INTRAMUSCULAR | Status: AC
  Administered 2015-05-30: 4 mg via INTRAVENOUS
  Filled 2015-05-30: qty 2

## 2015-05-30 MED ORDER — CEFTRIAXONE SODIUM 250 MG IJ SOLR
250.0000 mg | Freq: Once | INTRAMUSCULAR | Status: AC
  Administered 2015-05-30: 250 mg via INTRAMUSCULAR
  Filled 2015-05-30: qty 250

## 2015-05-30 MED ORDER — SODIUM CHLORIDE 0.9 % IV BOLUS (SEPSIS)
1000.0000 mL | Freq: Once | INTRAVENOUS | Status: AC
  Administered 2015-05-30: 1000 mL via INTRAVENOUS

## 2015-05-30 MED ORDER — HYDROMORPHONE HCL 1 MG/ML IJ SOLN: 0.5000 mg | Freq: Once | INTRAMUSCULAR | Status: DC

## 2015-05-30 MED ORDER — HYDROCODONE-ACETAMINOPHEN 5-325 MG PO TABS
1.0000 | ORAL_TABLET | Freq: Once | ORAL | Status: AC
Start: 2015-05-30 — End: 2015-05-30
  Administered 2015-05-30: 1 via ORAL
  Filled 2015-05-30: qty 1

## 2015-05-30 NOTE — Discharge Instructions (Signed)

## 2015-05-30 NOTE — ED Provider Notes (Signed)
CSN: [id number]     Arrival date & time 05/30/15  2010 History   First MD Initiated Contact with Patient 05/30/15 2214     Chief Complaint  Patient presents with  . Abdominal Pain  . Emesis  . Hematuria   Patient is a 34 y.o. male presenting with hematochezia.  Rectal Bleeding Quality:  Bright red Amount:  Scant Timing:  Intermittent Progression:  Waxing and waning Chronicity:  Chronic Context: spontaneously   Context: not anal fissures, not anal penetration, not constipation, not diarrhea, not foreign body and not hemorrhoids   Similar prior episodes: yes   Relieved by:  Nothing Associated symptoms: abdominal pain and vomiting   Associated symptoms: no epistaxis, no fever, no hematemesis, no light-headedness and no loss of consciousness     Past Medical History  Diagnosis Date  . Prostatitis   . Hypertension 04/03/2012   History reviewed. No pertinent past surgical history. Family History  Problem Relation Age of Onset  . Asthma Brother   . Cancer Father   . Thyroid disease Mother   . Osteoarthritis Mother   . Hypertension Mother    Social History  Substance Use Topics  . Smoking status: Current Some Day Smoker    Types: Cigarettes  . Smokeless tobacco: None  . Alcohol Use: Yes     Comment: 1 - 40 oz beer q day    Review of Systems  Constitutional: Negative for fever.  HENT: Negative for nosebleeds.   Gastrointestinal: Positive for vomiting, abdominal pain and hematochezia. Negative for hematemesis.  Neurological: Negative for loss of consciousness and light-headedness.  All other systems reviewed and are negative.     Allergies  Review of patient's allergies indicates no known allergies.  Home Medications   Prior to Admission medications   Medication Sig Start Date End Date Taking? Authorizing Provider  benzonatate (TESSALON PERLES) 100 MG capsule Take 1 capsule (100 mg total) by mouth 3 (three) times daily as needed for cough. Patient not taking:  Reported on 05/30/2015 05/11/15   Courteney Lyn Mackuen, MD  guaiFENesin-codeine 100-10 MG/5ML syrup Take 5 mLs by mouth every 6 (six) hours as needed for cough. Patient not taking: Reported on 05/30/2015 05/11/15   Courteney Lyn Mackuen, MD  ibuprofen (ADVIL,MOTRIN) 800 MG tablet Take 1 tablet (800 mg total) by mouth 3 (three) times daily. Patient not taking: Reported on 05/30/2015 05/11/15   Courteney Lyn Mackuen, MD   BP 112/77 mmHg  Pulse 62  Temp(Src) 97.9 F (36.6 C) (Oral)  Resp 16  SpO2 97% Physical Exam  Constitutional: He is oriented to person, place, and time. He appears well-developed and well-nourished. No distress.  HENT:  Head: Normocephalic.  Eyes: Pupils are equal, round, and reactive to light.  Neck: Normal range of motion.  Cardiovascular: Normal rate.   Pulmonary/Chest: No respiratory distress. He has wheezes.  Abdominal: Soft. He exhibits no distension.  Mild diffuse tenderness without rebound, guarding or mass.  Genitourinary:  Tenderness to prostate upon palpation.   Musculoskeletal: Normal range of motion. He exhibits no edema.  Neurological: He is alert and oriented to person, place, and time. No cranial nerve deficit. Coordination normal.  Skin: Skin is warm and dry. He is not diaphoretic. No erythema.  Psychiatric: He has a normal mood and affect. His behavior is normal. Judgment and thought content normal.  Nursing note and vitals reviewed.   ED Course  Procedures (including critical care time) Labs Review Labs Reviewed  COMPREHENSIVE METABOLIC PANEL - Abnormal; Notable  for the following:    Chloride 98 (*)    Glucose, Bld 106 (*)    BUN 5 (*)    All other components within normal limits  CBC - Abnormal; Notable for the following:    MCV 101.5 (*)    All other components within normal limits  URINALYSIS, ROUTINE W REFLEX MICROSCOPIC (NOT AT [facility]) - Abnormal; Notable for the following:    Color, Urine AMBER (*)    APPearance CLOUDY (*)    Glucose,  UA 100 (*)    Hgb urine dipstick LARGE (*)    Bilirubin Urine SMALL (*)    Ketones, ur 15 (*)    Protein, ur 30 (*)    Leukocytes, UA MODERATE (*)    All other components within normal limits  URINE MICROSCOPIC-ADD ON - Abnormal; Notable for the following:    Squamous Epithelial / LPF FEW (*)    All other components within normal limits  LIPASE, BLOOD  POC OCCULT BLOOD, ED   Imaging Review No results found. I have personally reviewed and evaluated these images and lab results as part of my medical decision-making.   EKG Interpretation None      MDM   Patient presents to the Ed with chronic abdominal pain, hematuria, blood in semen that he states is similar to his previous episodes of prostatitis. This has occurred for the past 2 days. Patient has been here multiple times for same and had arranged GI and Urology follow up but patient has not. This initially began in 2013. Patient's abdominal exam is reassuring and at this time, inconsistent with surgical abdominal pathology including diverticulitis/appendicitis or abscess. Patient afebrile here. Prostate is tender to touch. Given young age, will treat with Doxy and Ceftriaxone. We stressed the importance of follow up with appropriate specialists. Patient agrees to follow up with them.   Final diagnoses:  Acute prostatitis     Deirdre Peer, MD 05/31/15 1610  Dione Booze, MD 05/31/15 [phone]

## 2015-05-30 NOTE — ED Notes (Signed)
Pt reports to the ED for eval of lower abd pain and burning. Reports associated N/V/D. Has been seen for the same recently but reports he has also developed some hematuria and blood in his stool and emesis. States he has also seen some blood in his semen as well. He first noticed the bleeding x 3 days ago. Pt reports chills but unsure if he has had a fever or not. Pt A&Ox4, resp e/u, and skin warm and dry.

## 2015-08-07 ENCOUNTER — Emergency Department (HOSPITAL_COMMUNITY): Payer: No Typology Code available for payment source

## 2015-08-07 ENCOUNTER — Emergency Department (HOSPITAL_COMMUNITY)
Admission: EM | Admit: 2015-08-07 | Discharge: 2015-08-07 | Disposition: A | Discharge status: Home / Self Care | Payer: No Typology Code available for payment source | Attending: Emergency Medicine | Admitting: Emergency Medicine

## 2015-08-07 ENCOUNTER — Encounter (HOSPITAL_COMMUNITY): Payer: Self-pay

## 2015-08-07 DIAGNOSIS — Y92410 Unspecified street and highway as the place of occurrence of the external cause: Secondary | ICD-10-CM | POA: Insufficient documentation

## 2015-08-07 DIAGNOSIS — S3992XA Unspecified injury of lower back, initial encounter: Secondary | ICD-10-CM | POA: Diagnosis not present

## 2015-08-07 DIAGNOSIS — Y9389 Activity, other specified: Secondary | ICD-10-CM | POA: Diagnosis not present

## 2015-08-07 DIAGNOSIS — Z792 Long term (current) use of antibiotics: Secondary | ICD-10-CM | POA: Diagnosis not present

## 2015-08-07 DIAGNOSIS — T1490XA Injury, unspecified, initial encounter: Secondary | ICD-10-CM

## 2015-08-07 DIAGNOSIS — S8392XA Sprain of unspecified site of left knee, initial encounter: Secondary | ICD-10-CM

## 2015-08-07 DIAGNOSIS — I10 Essential (primary) hypertension: Secondary | ICD-10-CM | POA: Insufficient documentation

## 2015-08-07 DIAGNOSIS — Y998 Other external cause status: Secondary | ICD-10-CM | POA: Insufficient documentation

## 2015-08-07 DIAGNOSIS — F17210 Nicotine dependence, cigarettes, uncomplicated: Secondary | ICD-10-CM | POA: Diagnosis not present

## 2015-08-07 DIAGNOSIS — S8992XA Unspecified injury of left lower leg, initial encounter: Secondary | ICD-10-CM | POA: Diagnosis present

## 2015-08-07 DIAGNOSIS — Z87438 Personal history of other diseases of male genital organs: Secondary | ICD-10-CM | POA: Insufficient documentation

## 2015-08-07 LAB — COMPREHENSIVE METABOLIC PANEL
ALBUMIN: 4 g/dL (ref 3.5–5.0)
ALT: 20 U/L (ref 17–63)
ANION GAP: 10 (ref 5–15)
AST: 35 U/L (ref 15–41)
Alkaline Phosphatase: 51 U/L (ref 38–126)
BUN: <5 mg/dL — ABNORMAL LOW (ref 6–20)
CHLORIDE: 105 mmol/L (ref 101–111)
CO2: 27 mmol/L (ref 22–32)
Calcium: 9 mg/dL (ref 8.9–10.3)
Creatinine, Ser: 1.1 mg/dL (ref 0.61–1.24)
GFR calc Af Amer: >60 mL/min (ref >60)
GFR calc non Af Amer: >60 mL/min (ref >60)
GLUCOSE: 98 mg/dL (ref 65–99)
POTASSIUM: 4.2 mmol/L (ref 3.5–5.1)
SODIUM: 142 mmol/L (ref 135–145)
Total Bilirubin: 0.6 mg/dL (ref 0.3–1.2)
Total Protein: 6.8 g/dL (ref 6.5–8.1)

## 2015-08-07 LAB — CBC
HCT: 46.1 % (ref 39.0–52.0)
HEMOGLOBIN: 15.2 g/dL (ref 13.0–17.0)
MCH: 33.5 pg (ref 26.0–34.0)
MCHC: 33 g/dL (ref 30.0–36.0)
MCV: 101.5 fL — AB (ref 78.0–100.0)
Platelets: 269 10*3/uL (ref 150–400)
RBC: 4.54 MIL/uL (ref 4.22–5.81)
RDW: 13.6 % (ref 11.5–15.5)
WBC: 6.9 10*3/uL (ref 4.0–10.5)

## 2015-08-07 LAB — PROTIME-INR
INR: 0.89 (ref 0.00–1.49)
Prothrombin Time: 12.2 seconds (ref 11.6–15.2)

## 2015-08-07 LAB — SAMPLE TO BLOOD BANK

## 2015-08-07 LAB — ETHANOL: Alcohol, Ethyl (B): 199 mg/dL — ABNORMAL HIGH (ref <5)

## 2015-08-07 LAB — CDS SEROLOGY

## 2015-08-07 MED ORDER — OXYCODONE-ACETAMINOPHEN 5-325 MG PO TABS
1.0000 | ORAL_TABLET | Freq: Once | ORAL | Status: AC
  Administered 2015-08-07: 1 via ORAL
  Filled 2015-08-07: qty 1

## 2015-08-07 MED ORDER — IBUPROFEN 800 MG PO TABS: 800.0000 mg | ORAL_TABLET | Freq: Three times a day (TID) | ORAL | Status: DC

## 2015-08-07 NOTE — ED Notes (Signed)
Attempted to walk pt.

## 2015-08-07 NOTE — ED Notes (Signed)
Pt asleep and only mumbled answers to pain question
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
## 2015-08-07 NOTE — ED Notes (Signed)
Patient transported to CT 

## 2015-08-07 NOTE — ED Notes (Signed)
MD at bedside. 

## 2015-08-07 NOTE — Discharge Instructions (Signed)
Knee Sprain A knee sprain is a tear in the strong bands of tissue that connect the bones (ligaments) of your knee. HOME CARE  Raise (elevate) your injured knee to lessen puffiness (swelling).  To ease pain and puffiness, put ice on the injured area.  Put ice in a plastic bag.  Place a towel between your skin and the bag.  Leave the ice on for 20 minutes, 2-3 times a day.  Only take medicine as told by your doctor.  Do not leave your knee unprotected until pain and stiffness go away (usually 4-6 weeks).  If you have a cast or splint, do not get it wet. If your doctor told you to not take it off, cover it with a plastic bag when you shower or bathe. Do not swim.  Your doctor may have you do exercises to prevent or limit permanent weakness and stiffness. GET HELP RIGHT AWAY IF:   Your cast or splint becomes damaged.  Your pain gets worse.  You have a lot of pain, puffiness, or numbness below the cast or splint. MAKE SURE YOU:   Understand these instructions.  Will watch your condition.  Will get help right away if you are not doing well or get worse.   This information is not intended to replace advice given to you by your health care provider. Make sure you discuss any questions you have with your health care provider.   Document Released: 07/17/2009 Document Revised: 08/03/2013 Document Reviewed: 04/06/2013 Elsevier Interactive Patient Education 2016 [employer]  Tourist information centre managerMotor Vehicle Collision It is common to have multiple bruises and sore muscles after a motor vehicle collision (MVC). These tend to feel worse for the first 24 hours. You may have the most stiffness and soreness over the first several hours. You may also feel worse when you wake up the first morning after your collision. After this point, you will usually begin to improve with each day. The speed of improvement often depends on the severity of the collision, the number of injuries, and the location and nature of  these injuries. HOME CARE INSTRUCTIONS  Put ice on the injured area.  Put ice in a plastic bag.  Place a towel between your skin and the bag.  Leave the ice on for 15-20 minutes, 3-4 times a day, or as directed by your health care provider.  Drink enough fluids to keep your urine clear or pale yellow. Do not drink alcohol.  Take a warm shower or bath once or twice a day. This will increase blood flow to sore muscles.  You may return to activities as directed by your caregiver. Be careful when lifting, as this may aggravate neck or back pain.  Only take over-the-counter or prescription medicines for pain, discomfort, or fever as directed by your caregiver. Do not use aspirin. This may increase bruising and bleeding. SEEK IMMEDIATE MEDICAL CARE IF:  You have numbness, tingling, or weakness in the arms or legs.  You develop severe headaches not relieved with medicine.  You have severe neck pain, especially tenderness in the middle of the back of your neck.  You have changes in bowel or bladder control.  There is increasing pain in any area of the body.  You have shortness of breath, light-headedness, dizziness, or fainting.  You have chest pain.  You feel sick to your stomach (nauseous), throw up (vomit), or sweat.  You have increasing abdominal discomfort.  There is blood in your urine, stool, or vomit.  You  have pain in your shoulder (shoulder strap areas).  You feel your symptoms are getting worse. MAKE SURE YOU:  Understand these instructions.  Will watch your condition.  Will get help right away if you are not doing well or get worse.   This information is not intended to replace advice given to you by your health care provider. Make sure you discuss any questions you have with your health care provider.   Document Released: 07/29/2005 Document Revised: 08/19/2014 Document Reviewed: 12/26/2010 Elsevier Interactive Patient Education [employer]
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
## 2015-08-14 ENCOUNTER — Emergency Department (HOSPITAL_COMMUNITY)
Admission: EM | Admit: 2015-08-14 | Discharge: 2015-08-14 | Disposition: A | Discharge status: Home / Self Care | Payer: No Typology Code available for payment source | Attending: Emergency Medicine | Admitting: Emergency Medicine

## 2015-08-14 ENCOUNTER — Encounter (HOSPITAL_COMMUNITY): Payer: Self-pay | Admitting: *Deleted

## 2015-08-14 DIAGNOSIS — Z791 Long term (current) use of non-steroidal anti-inflammatories (NSAID): Secondary | ICD-10-CM | POA: Diagnosis not present

## 2015-08-14 DIAGNOSIS — S299XXA Unspecified injury of thorax, initial encounter: Secondary | ICD-10-CM | POA: Insufficient documentation

## 2015-08-14 DIAGNOSIS — N410 Acute prostatitis: Secondary | ICD-10-CM | POA: Diagnosis not present

## 2015-08-14 DIAGNOSIS — I10 Essential (primary) hypertension: Secondary | ICD-10-CM | POA: Diagnosis not present

## 2015-08-14 DIAGNOSIS — Z792 Long term (current) use of antibiotics: Secondary | ICD-10-CM | POA: Insufficient documentation

## 2015-08-14 DIAGNOSIS — J069 Acute upper respiratory infection, unspecified: Secondary | ICD-10-CM | POA: Insufficient documentation

## 2015-08-14 DIAGNOSIS — S8991XA Unspecified injury of right lower leg, initial encounter: Secondary | ICD-10-CM | POA: Insufficient documentation

## 2015-08-14 DIAGNOSIS — S8992XA Unspecified injury of left lower leg, initial encounter: Secondary | ICD-10-CM | POA: Insufficient documentation

## 2015-08-14 DIAGNOSIS — Y998 Other external cause status: Secondary | ICD-10-CM | POA: Insufficient documentation

## 2015-08-14 DIAGNOSIS — Y9389 Activity, other specified: Secondary | ICD-10-CM | POA: Diagnosis not present

## 2015-08-14 DIAGNOSIS — S3992XA Unspecified injury of lower back, initial encounter: Secondary | ICD-10-CM | POA: Diagnosis present

## 2015-08-14 DIAGNOSIS — F17210 Nicotine dependence, cigarettes, uncomplicated: Secondary | ICD-10-CM | POA: Insufficient documentation

## 2015-08-14 DIAGNOSIS — Y92410 Unspecified street and highway as the place of occurrence of the external cause: Secondary | ICD-10-CM | POA: Insufficient documentation

## 2015-08-14 MED ORDER — METHOCARBAMOL 500 MG PO TABS
1000.0000 mg | ORAL_TABLET | Freq: Once | ORAL | Status: AC
  Administered 2015-08-14: 1000 mg via ORAL
  Filled 2015-08-14: qty 2

## 2015-08-14 MED ORDER — OSELTAMIVIR PHOSPHATE 75 MG PO CAPS: 75.0000 mg | ORAL_CAPSULE | Freq: Two times a day (BID) | ORAL | Status: DC

## 2015-08-14 MED ORDER — IBUPROFEN 800 MG PO TABS: 800.0000 mg | ORAL_TABLET | Freq: Three times a day (TID) | ORAL | Status: DC

## 2015-08-14 MED ORDER — METHOCARBAMOL 500 MG PO TABS: 500.0000 mg | ORAL_TABLET | Freq: Two times a day (BID) | ORAL | Status: DC

## 2015-08-14 MED ORDER — HYDROCODONE-ACETAMINOPHEN 5-325 MG PO TABS
1.0000 | ORAL_TABLET | Freq: Once | ORAL | Status: AC
  Administered 2015-08-14: 1 via ORAL
  Filled 2015-08-14: qty 1

## 2015-08-14 NOTE — ED Notes (Signed)
Pt c/o bilateral leg pain and numbness with low back pain. Also reports generalized bodyaches with cough

## 2015-08-14 NOTE — ED Notes (Signed)
Pt given sandwich and Sprite at d/c
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
## 2015-09-08 ENCOUNTER — Encounter (HOSPITAL_COMMUNITY): Payer: Self-pay | Admitting: *Deleted

## 2015-09-08 ENCOUNTER — Emergency Department (HOSPITAL_COMMUNITY)
Admission: EM | Admit: 2015-09-08 | Discharge: 2015-09-08 | Disposition: A | Discharge status: Home / Self Care | Payer: Self-pay | Attending: Emergency Medicine | Admitting: Emergency Medicine

## 2015-09-08 DIAGNOSIS — F1310 Sedative, hypnotic or anxiolytic abuse, uncomplicated: Secondary | ICD-10-CM | POA: Insufficient documentation

## 2015-09-08 DIAGNOSIS — F17210 Nicotine dependence, cigarettes, uncomplicated: Secondary | ICD-10-CM | POA: Insufficient documentation

## 2015-09-08 DIAGNOSIS — F1110 Opioid abuse, uncomplicated: Secondary | ICD-10-CM | POA: Insufficient documentation

## 2015-09-08 DIAGNOSIS — I10 Essential (primary) hypertension: Secondary | ICD-10-CM | POA: Insufficient documentation

## 2015-09-08 DIAGNOSIS — Z791 Long term (current) use of non-steroidal anti-inflammatories (NSAID): Secondary | ICD-10-CM | POA: Insufficient documentation

## 2015-09-08 DIAGNOSIS — Z792 Long term (current) use of antibiotics: Secondary | ICD-10-CM | POA: Insufficient documentation

## 2015-09-08 DIAGNOSIS — R41840 Attention and concentration deficit: Secondary | ICD-10-CM | POA: Insufficient documentation

## 2015-09-08 DIAGNOSIS — F1910 Other psychoactive substance abuse, uncomplicated: Secondary | ICD-10-CM

## 2015-09-08 DIAGNOSIS — F1010 Alcohol abuse, uncomplicated: Secondary | ICD-10-CM | POA: Insufficient documentation

## 2015-09-08 DIAGNOSIS — Z87438 Personal history of other diseases of male genital organs: Secondary | ICD-10-CM | POA: Insufficient documentation

## 2015-09-08 MED ORDER — DICYCLOMINE HCL 20 MG PO TABS: 20.0000 mg | ORAL_TABLET | Freq: Two times a day (BID) | ORAL | Status: DC

## 2015-09-08 MED ORDER — CHLORDIAZEPOXIDE HCL 25 MG PO CAPS: ORAL_CAPSULE | ORAL | Status: DC

## 2015-09-08 MED ORDER — METHOCARBAMOL 500 MG PO TABS: 500.0000 mg | ORAL_TABLET | Freq: Two times a day (BID) | ORAL | Status: DC

## 2015-09-08 NOTE — ED Notes (Signed)
Patient denies SI/HI.   Patient states no system problems at this time, but needs help with detox.

## 2015-09-08 NOTE — ED Provider Notes (Signed)
CSN: [id number]     Arrival date & time 09/08/15  1215 History  By signing my name below, I, Placido Sou, attest that this documentation has been prepared under the direction and in the presence of Farheen Pfahler, PA-C. Electronically Signed: Placido Sou, ED Scribe. 09/08/2015. 1:35 PM.    Chief Complaint  Patient presents with  . Drug Problem  . Medical Clearance   The history is provided by the patient. No language interpreter was used.    HPI Comments: Eddie Smith is a 35 y.o. male who presents to the Emergency Department requesting detox treatment. 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 He notes he has been using benzodiazepine for the past 6 months. He last consumed ETOH 2 hours ago and denies having taken any narcotics at the time. Pt denies having been to detox in the past. He notes a hx of smoking but denies any other known medical conditions. He denies SI/HI or any other symptoms at this time.   Past Medical History  Diagnosis Date  . Prostatitis   . Hypertension 04/03/2012   History reviewed. No pertinent past surgical history. Family History  Problem Relation Age of Onset  . Asthma Brother   . Cancer Father   . Thyroid disease Mother   . Osteoarthritis Mother   . Hypertension Mother    Social History  Substance Use Topics  . Smoking status: Current Some Day Smoker    Types: Cigarettes  . Smokeless tobacco: None  . Alcohol Use: Yes     Comment: 1 - 40 oz beer q day    Review of Systems  Psychiatric/Behavioral: Positive for decreased concentration. Negative for suicidal ideas and self-injury. The patient is not nervous/anxious.     Allergies  Review of patient's allergies indicates no known allergies.  Home Medications   Prior to Admission medications   Medication Sig Start Date End Date Taking? Authorizing Provider  benzonatate (TESSALON PERLES) 100 MG capsule Take 1 capsule (100 mg total) by mouth  3 (three) times daily as needed for cough. Patient not taking: Reported on 05/30/2015 05/11/15   Courteney Lyn Mackuen, MD  doxycycline (VIBRAMYCIN) 100 MG capsule Take 1 capsule (100 mg total) by mouth 2 (two) times daily. 05/30/15   Deirdre Peer, MD  guaiFENesin-codeine 100-10 MG/5ML syrup Take 5 mLs by mouth every 6 (six) hours as needed for cough. Patient not taking: Reported on 05/30/2015 05/11/15   Courteney Lyn Mackuen, MD  HYDROcodone-acetaminophen (NORCO/VICODIN) 5-325 MG tablet Take 1 tablet by mouth every 4 (four) hours as needed. 05/30/15   Deirdre Peer, MD  ibuprofen (ADVIL,MOTRIN) 800 MG tablet Take 1 tablet (800 mg total) by mouth 3 (three) times daily. 08/14/15   Melton Krebs, PA-C  methocarbamol (ROBAXIN) 500 MG tablet Take 1 tablet (500 mg total) by mouth 2 (two) times daily. 08/14/15   Melton Krebs, PA-C  oseltamivir (TAMIFLU) 75 MG capsule Take 1 capsule (75 mg total) by mouth every 12 (twelve) hours. 08/14/15   Melton Krebs, PA-C   BP 118/87 mmHg  Pulse 96  Temp(Src) 98.6 F (37 C) (Oral)  Resp 18  SpO2 97%    Physical Exam  Constitutional: He is oriented to person, place, and time. He appears well-developed and well-nourished.  HENT:  Head: Normocephalic and atraumatic.  Eyes: EOM are normal.  Neck: Normal range of motion.  Cardiovascular: Normal rate, regular rhythm and normal heart sounds.  Exam reveals no gallop  and no friction rub.   No murmur heard. Pulmonary/Chest: Effort normal and breath sounds normal. No respiratory distress. He has no wheezes. He has no rales.  Lungs clear to ascultation bilaterally   Abdominal: Soft.  Musculoskeletal: Normal range of motion.  Neurological: He is alert and oriented to person, place, and time.  Skin: Skin is warm and dry.  Nursing note and vitals reviewed.   ED Course  Procedures  DIAGNOSTIC STUDIES: Oxygen Saturation is 97% on RA, normal by my interpretation.    COORDINATION OF CARE: 1:32  PM Pt presents today for drug detox. Discussed treatment options for pt. He understood and is agreeable with the plan.   Labs Review Labs Reviewed - No data to display  Imaging Review No results found.   EKG Interpretation None      MDM   Final diagnoses:  Polysubstance abuse  Alcohol abuse    Patient requesting detox from opiates and alcohol. Last use was today, last drink was 2 hours ago. Patient is in no active withdrawal. Vital signs are normal. He denies suicidal or homicidal ideations. Does not qualify for inpatient treatment at this time. Will discharge home with prescriptions to help with withdrawal. Will give outpatient resources for inpatient treatment programs.  Right prior to discharge, patient stated that he did not want to leave. He told me that "it would not be a good idea for me to be discharged." I asked him why that is and he couldn't  elaborate. Patient did tell the nurse that he may think about hurting himself if we discharge him. At this time patient did confirm he is not suicidal and is stable for dc.   Filed Vitals:   09/08/15 1309 09/08/15 1342  BP: 118/87 129/85  Pulse: 96 80  Temp: 98.6 F (37 C) 98.1 F (36.7 C)  TempSrc: Oral Oral  Resp: 18 16  SpO2: 97% 100%    I personally performed the services described in this documentation, which was scribed in my presence. The recorded information has been reviewed and is accurate.   Jaynie Crumble, PA-C 09/08/15 1448  Rolland Porter, MD 09/14/15 Rich Fuchs

## 2015-09-08 NOTE — ED Notes (Signed)
Pt requesting detox from opiates and etoh. Reports last taking 2 oxycontin approx two hours ago and drinking alcohol. Denies any SI or HI.

## 2015-09-08 NOTE — ED Notes (Signed)
EMT went to get discharge vitals and now patient states if he leaves he will hurt himself.   Patient to be discharged per PA.   He denied with PA during assessment.

## 2015-09-08 NOTE — Discharge Instructions (Signed)
Take medications as prescribed to help you with withdraw symptoms. Follow up with resources provided to help with your alcohol and drug problem.   Polysubstance Abuse When people abuse more than one drug or type of drug it is called polysubstance or polydrug abuse. For example, many smokers also drink alcohol. This is one form of polydrug abuse. Polydrug abuse also refers to the use of a drug to counteract an unpleasant effect produced by another drug. It may also be used to help with withdrawal from another drug. People who take stimulants may become agitated. Sometimes this agitation is countered with a tranquilizer. This helps protect against the unpleasant side effects. Polydrug abuse also refers to the use of different drugs at the same time.  Anytime drug use is interfering with normal living activities, it has become abuse. This includes problems with family and friends. Psychological dependence has developed when your mind tells you that the drug is needed. This is usually followed by physical dependence which has developed when continuing increases of drug are required to get the same feeling or "high". This is known as addiction or chemical dependency. A person's risk is much higher if there is a history of chemical dependency in the family. SIGNS OF CHEMICAL DEPENDENCY  You have been told by friends or family that drugs have become a problem.  You fight when using drugs.  You are having blackouts (not remembering what you do while using).  You feel sick from using drugs but continue using.  You lie about use or amounts of drugs (chemicals) used.  You need chemicals to get you going.  You are suffering in work performance or in school because of drug use.  You get sick from use of drugs but continue to use anyway.  You need drugs to relate to people or feel comfortable in social situations.  You use drugs to forget problems. "Yes" answered to any of the above signs of chemical  dependency indicates there are problems. The longer the use of drugs continues, the greater the problems will become. If there is a family history of drug or alcohol use, it is best not to experiment with these drugs. Continual use leads to tolerance. After tolerance develops more of the drug is needed to get the same feeling. This is followed by addiction. With addiction, drugs become the most important part of life. It becomes more important to take drugs than participate in the other usual activities of life. This includes relating to friends and family. Addiction is followed by dependency. Dependency is a condition where drugs are now needed not just to get high, but to feel normal. Addiction cannot be cured but it can be stopped. This often requires outside help and the care of professionals. Treatment centers are listed in the yellow pages under: Cocaine, Narcotics, and Alcoholics Anonymous. Most hospitals and clinics can refer you to a specialized care center. Talk to your caregiver if you need help.   This information is not intended to replace advice given to you by your health care provider. Make sure you discuss any questions you have with your health care provider.   Document Released: 03/20/2005 Document Revised: 10/21/2011 Document Reviewed: 08/03/2014 Elsevier Interactive Patient Education [employer]
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
## 2016-10-02 IMAGING — CR DG ELBOW COMPLETE 3+V*R*
4 series · 4 of 4 positions shown · non-contrast
Comparison: None.

CLINICAL DATA: Fall at work with posterior elbow pain in tenderness
to touch. Initial encounter.

EXAM:
RIGHT ELBOW - COMPLETE 3+ VIEW

[elbow ap]
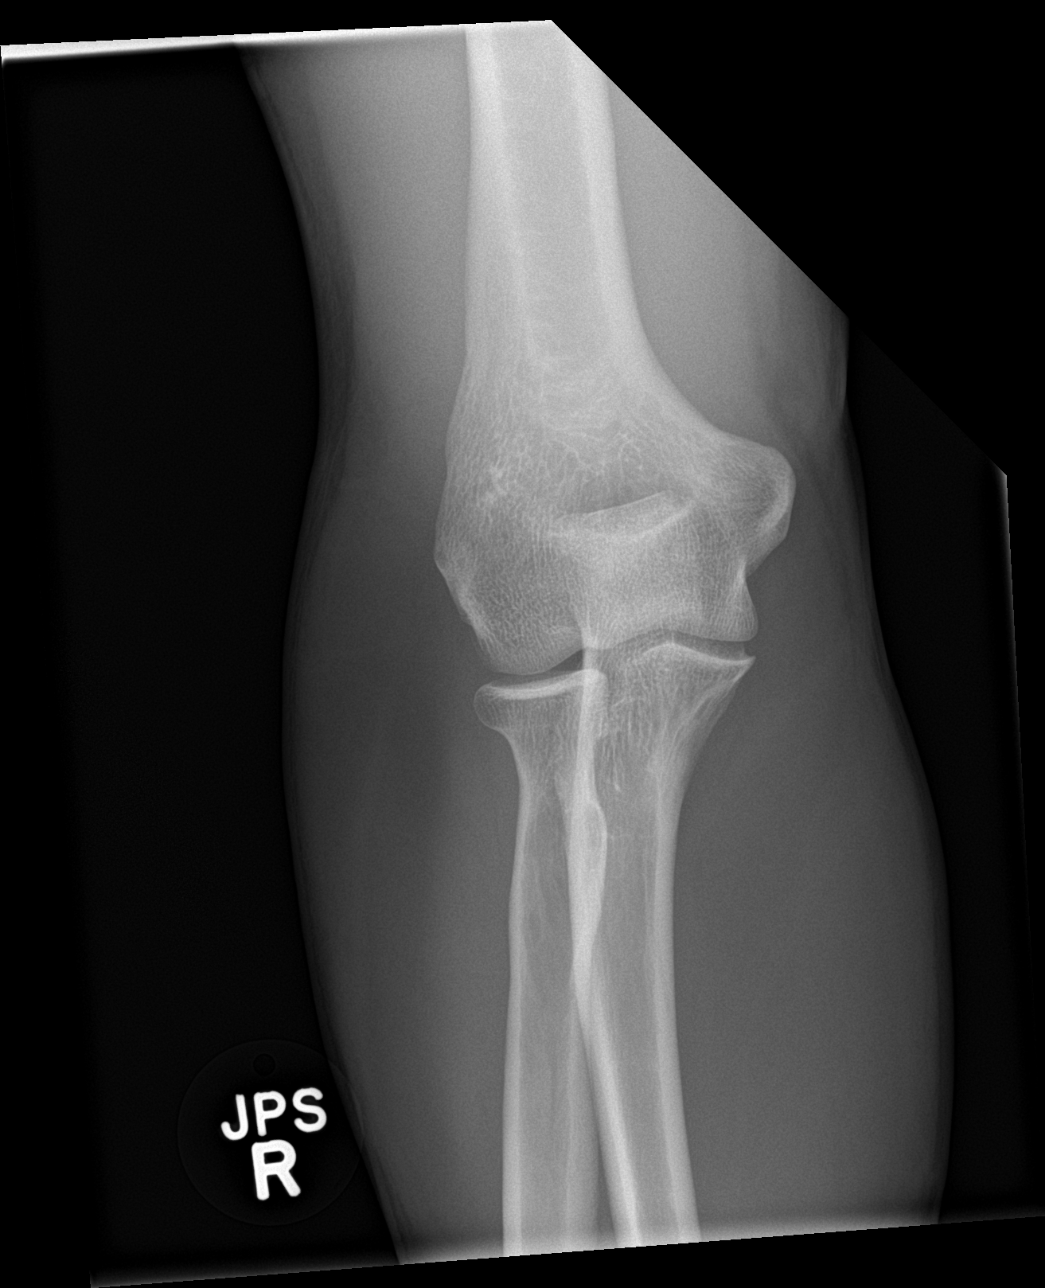

[elbow obl (1 of 2)]
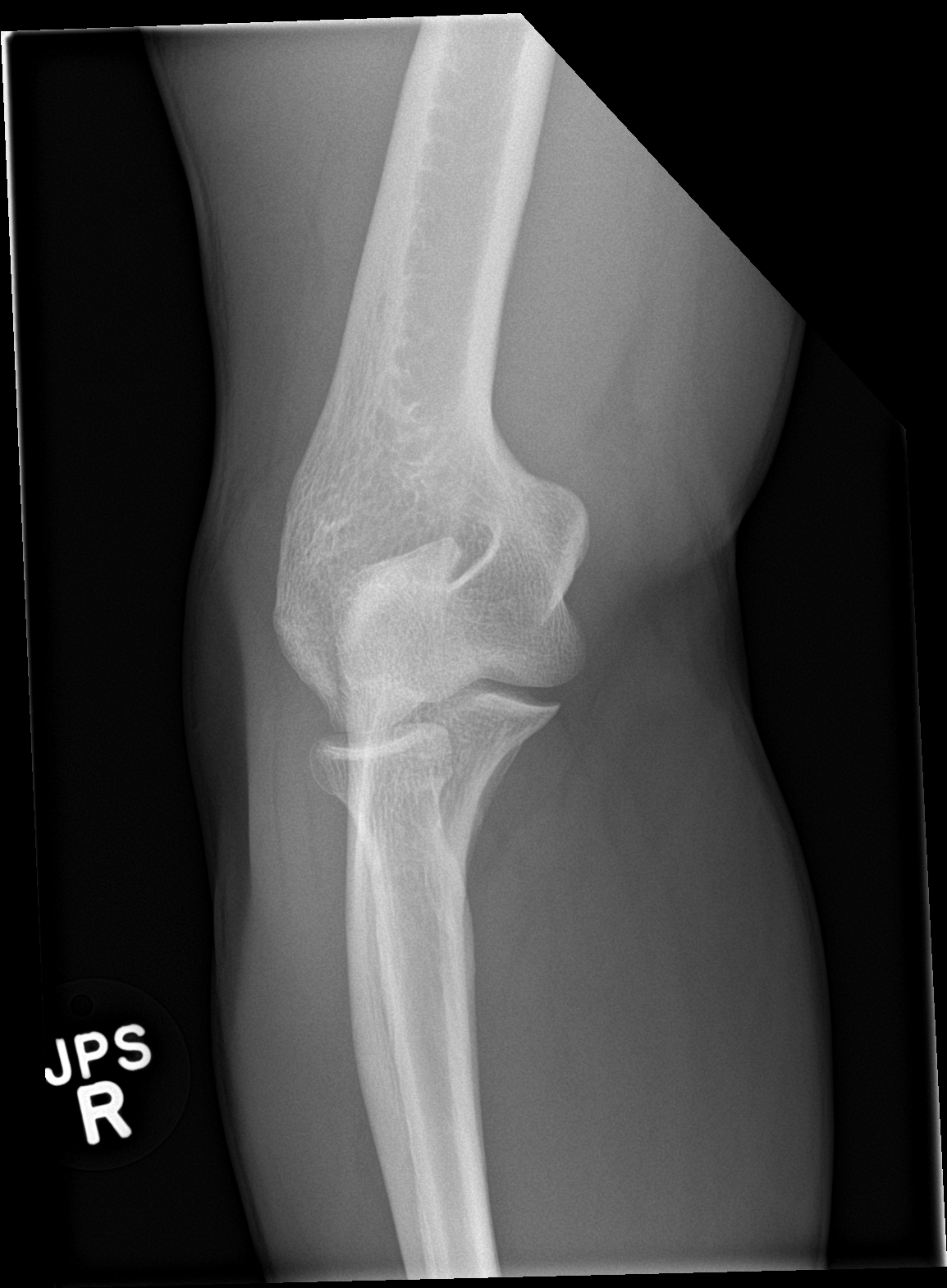

[elbow obl (2 of 2)]
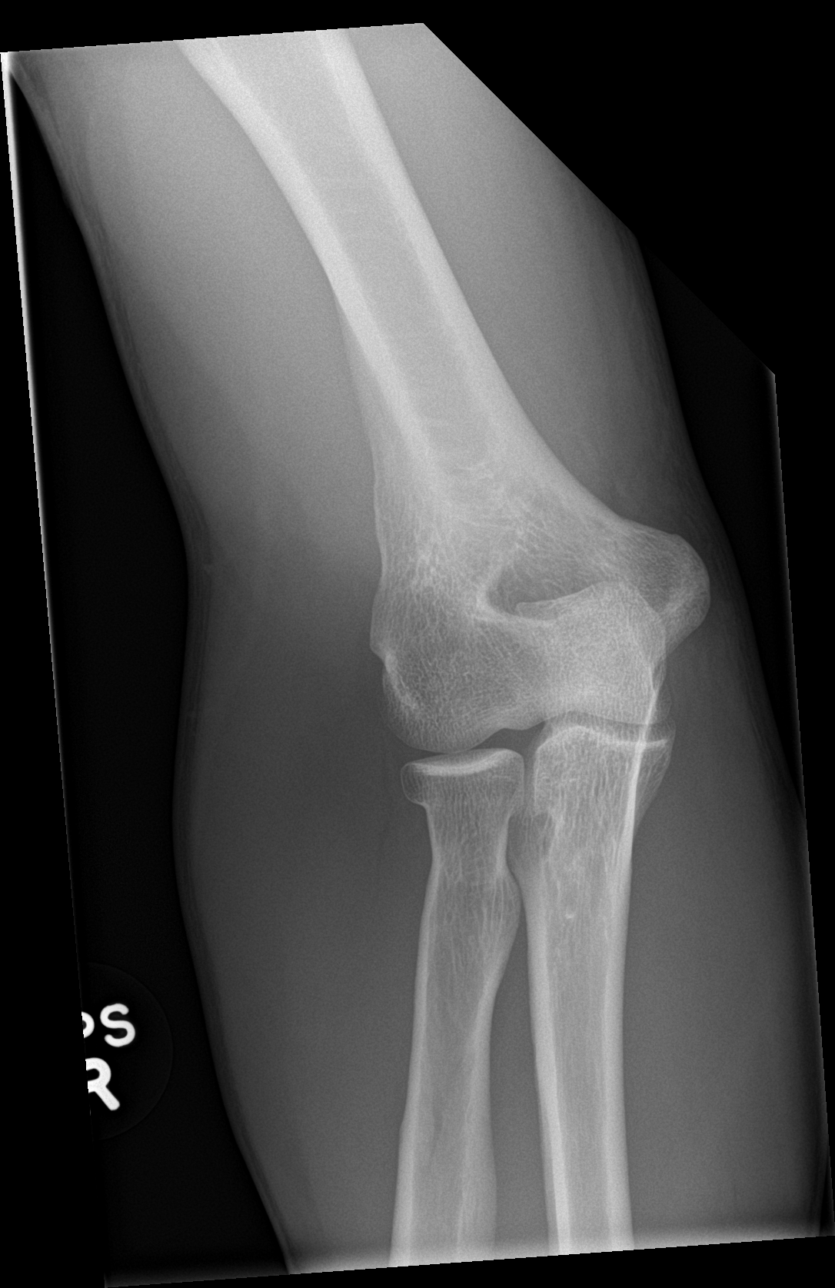

[elbow lat]
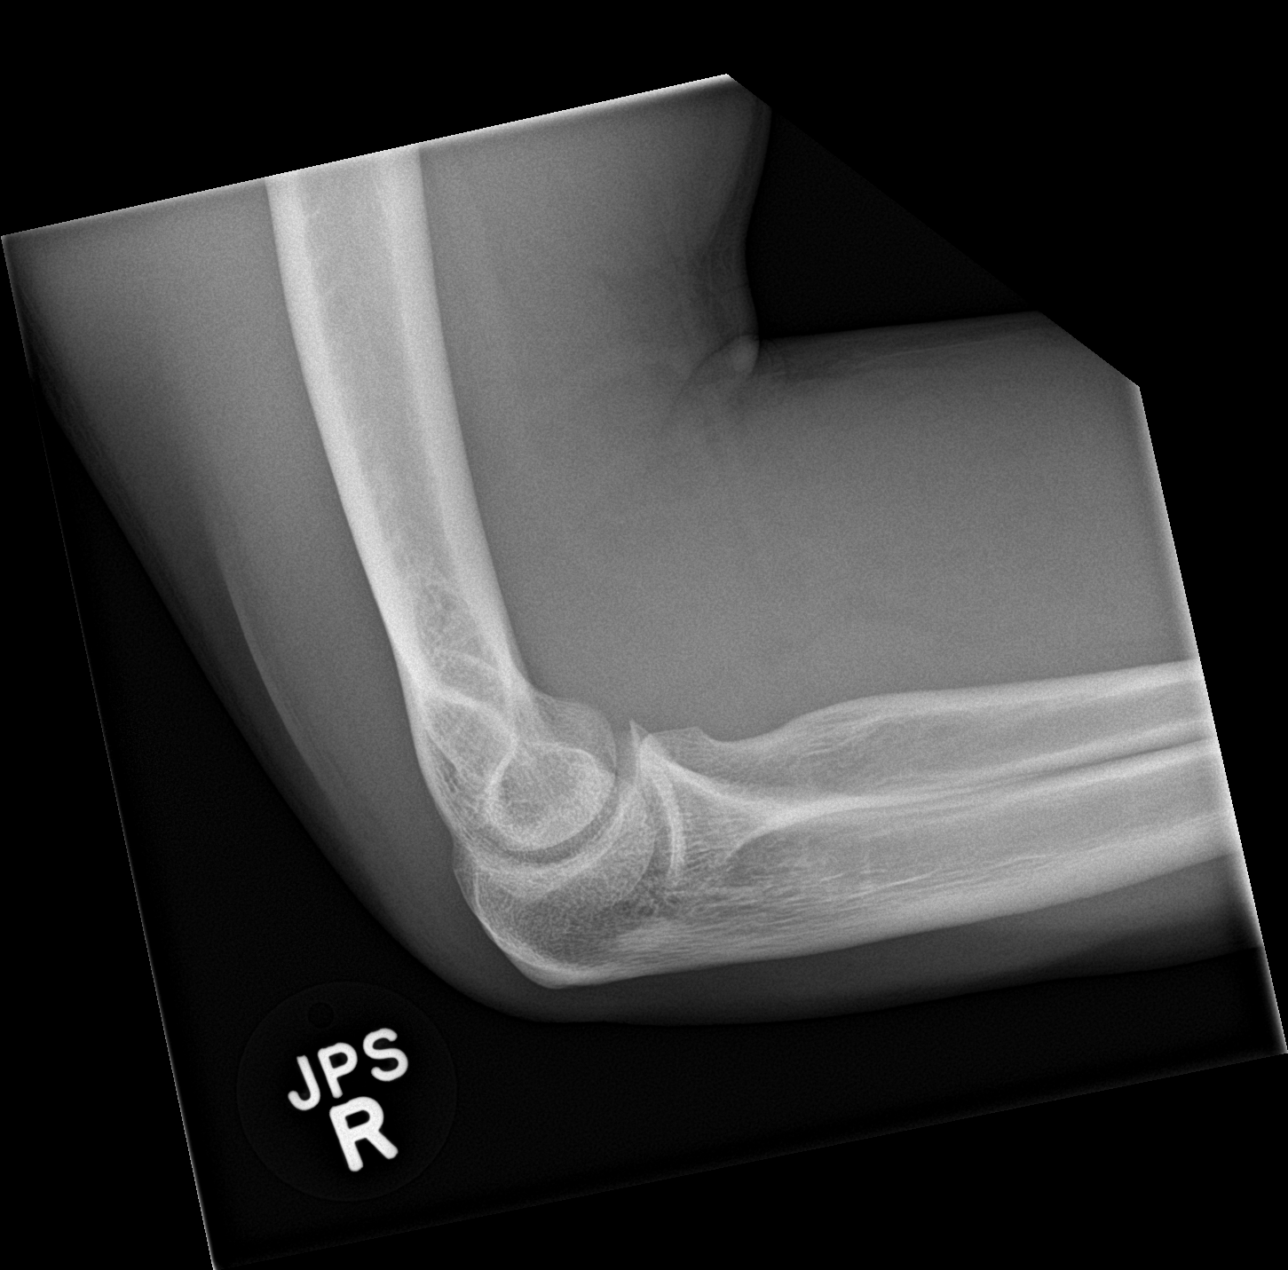

[4 of 4 positions shown; findings below may reference images not displayed]

FINDINGS: There is no evidence of fracture, dislocation, or joint effusion.
There is no evidence of arthropathy or other focal bone abnormality.
Soft tissues are unremarkable.
IMPRESSION: Negative.

## 2017-01-06 IMAGING — CR DG CHEST 2V
2 series · 2 of 2 positions shown · non-contrast
Comparison: December 03, 2012.

CLINICAL DATA: Acute shortness of breath.

EXAM:
CHEST  2 VIEW

[chest pa]
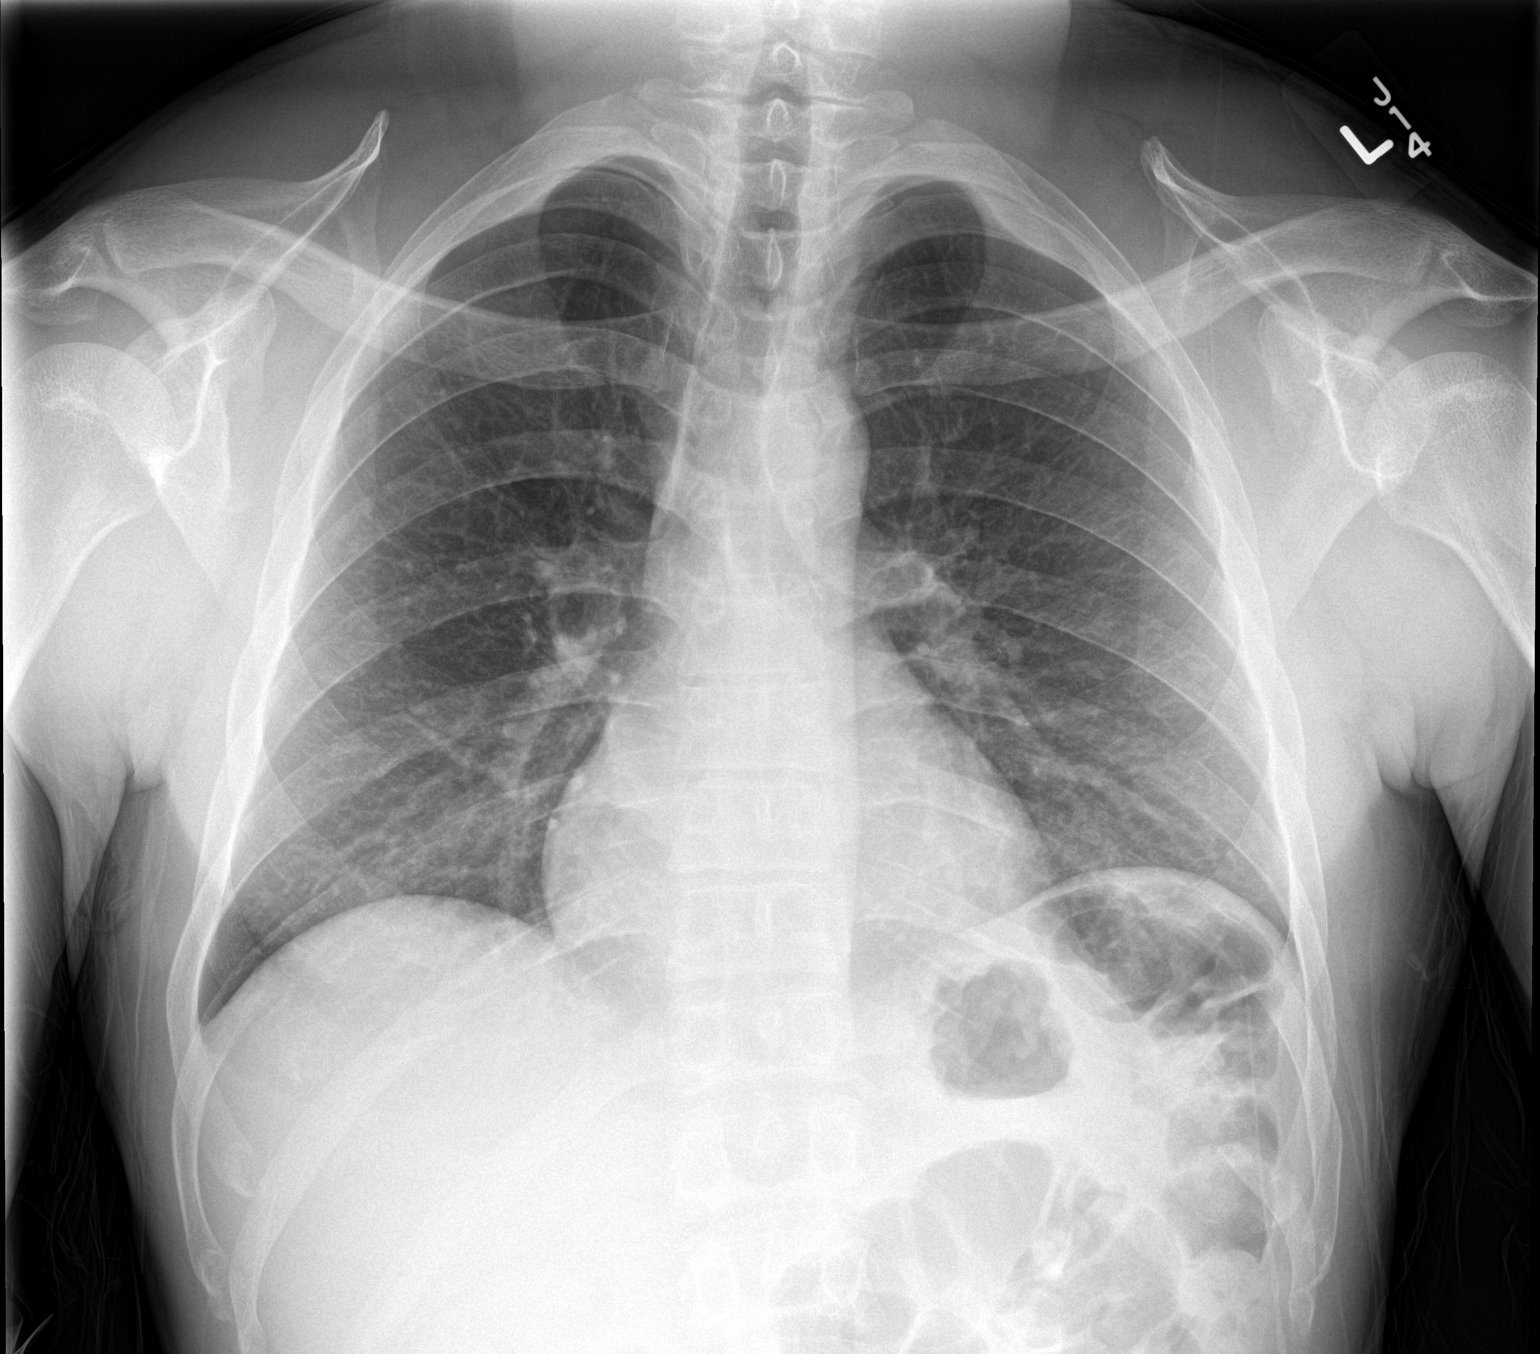

[chest lat]
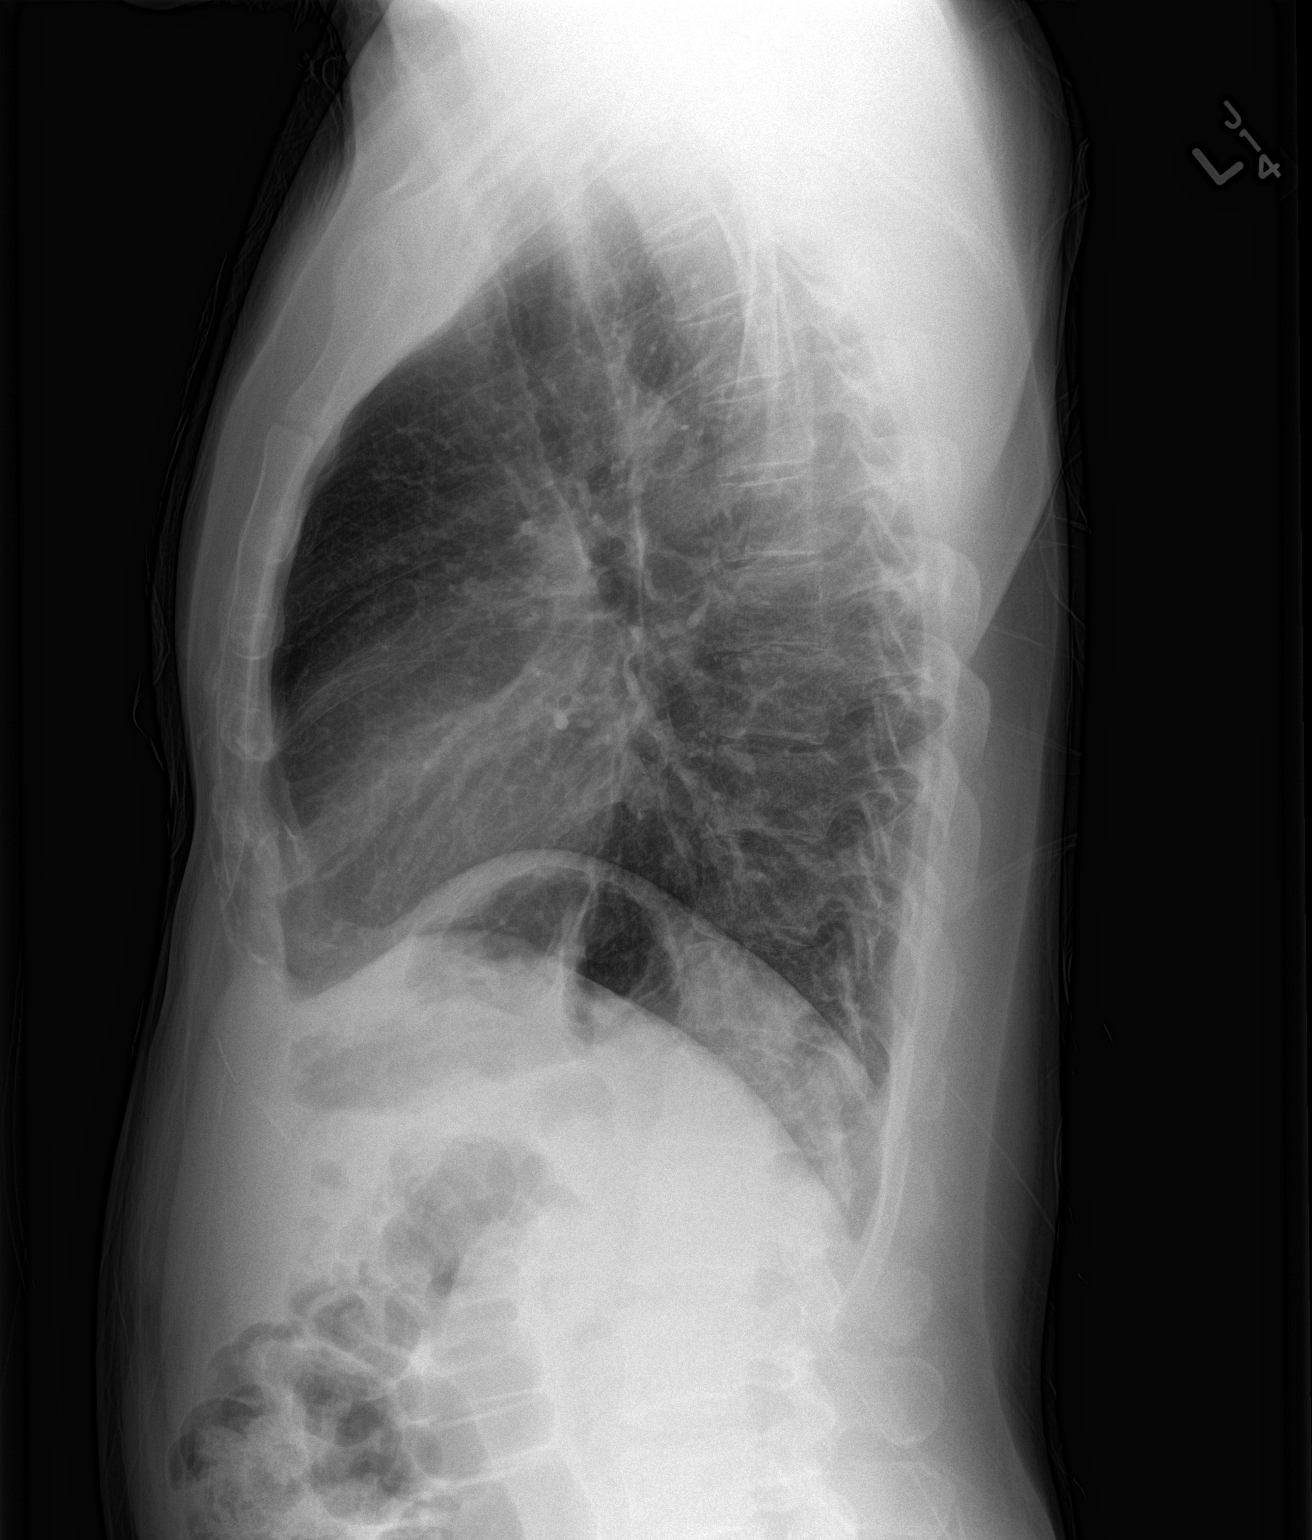

[2 of 2 positions shown; findings below may reference images not displayed]

FINDINGS: The heart size and mediastinal contours are within normal limits.
Both lungs are clear. No pneumothorax or pleural effusion is noted.
The visualized skeletal structures are unremarkable.
IMPRESSION: No active cardiopulmonary disease.

## 2017-05-19 ENCOUNTER — Emergency Department (HOSPITAL_COMMUNITY)
Admission: EM | Admit: 2017-05-19 | Discharge: 2017-05-19 | Disposition: A | Discharge status: Home / Self Care | Payer: Self-pay | Attending: Emergency Medicine | Admitting: Emergency Medicine

## 2017-05-19 ENCOUNTER — Emergency Department (HOSPITAL_COMMUNITY): Payer: Self-pay

## 2017-05-19 DIAGNOSIS — F17210 Nicotine dependence, cigarettes, uncomplicated: Secondary | ICD-10-CM | POA: Insufficient documentation

## 2017-05-19 DIAGNOSIS — R1012 Left upper quadrant pain: Secondary | ICD-10-CM | POA: Insufficient documentation

## 2017-05-19 DIAGNOSIS — M545 Low back pain, unspecified: Secondary | ICD-10-CM

## 2017-05-19 DIAGNOSIS — R1013 Epigastric pain: Secondary | ICD-10-CM | POA: Insufficient documentation

## 2017-05-19 DIAGNOSIS — R1011 Right upper quadrant pain: Secondary | ICD-10-CM | POA: Insufficient documentation

## 2017-05-19 DIAGNOSIS — I10 Essential (primary) hypertension: Secondary | ICD-10-CM | POA: Insufficient documentation

## 2017-05-19 LAB — BASIC METABOLIC PANEL
ANION GAP: 9 (ref 5–15)
BUN: 7 mg/dL (ref 6–20)
CHLORIDE: 99 mmol/L — AB (ref 101–111)
CO2: 27 mmol/L (ref 22–32)
Calcium: 9.2 mg/dL (ref 8.9–10.3)
Creatinine, Ser: 0.89 mg/dL (ref 0.61–1.24)
GFR calc non Af Amer: >60 mL/min (ref >60)
GLUCOSE: 118 mg/dL — AB (ref 65–99)
POTASSIUM: 4.1 mmol/L (ref 3.5–5.1)
Sodium: 135 mmol/L (ref 135–145)

## 2017-05-19 LAB — CBC
HEMATOCRIT: 46.5 % (ref 39.0–52.0)
HEMOGLOBIN: 15.1 g/dL (ref 13.0–17.0)
MCH: 32.8 pg (ref 26.0–34.0)
MCHC: 32.5 g/dL (ref 30.0–36.0)
MCV: 100.9 fL — AB (ref 78.0–100.0)
Platelets: 249 10*3/uL (ref 150–400)
RBC: 4.61 MIL/uL (ref 4.22–5.81)
RDW: 14.2 % (ref 11.5–15.5)
WBC: 7.9 10*3/uL (ref 4.0–10.5)

## 2017-05-19 LAB — HEPATIC FUNCTION PANEL
ALBUMIN: 4.3 g/dL (ref 3.5–5.0)
ALT: 12 U/L — AB (ref 17–63)
AST: 22 U/L (ref 15–41)
Alkaline Phosphatase: 66 U/L (ref 38–126)
BILIRUBIN DIRECT: 0.1 mg/dL (ref 0.1–0.5)
BILIRUBIN TOTAL: 0.6 mg/dL (ref 0.3–1.2)
Indirect Bilirubin: 0.5 mg/dL (ref 0.3–0.9)
Total Protein: 7.7 g/dL (ref 6.5–8.1)

## 2017-05-19 LAB — I-STAT TROPONIN, ED: Troponin i, poc: 0.01 ng/mL (ref 0.00–0.08)

## 2017-05-19 LAB — LIPASE, BLOOD: Lipase: 41 U/L (ref 11–51)

## 2017-05-19 MED ORDER — GI COCKTAIL ~~LOC~~
30.0000 mL | Freq: Once | ORAL | Status: AC
  Administered 2017-05-19: 30 mL via ORAL
  Filled 2017-05-19: qty 30

## 2017-05-19 MED ORDER — OMEPRAZOLE 20 MG PO CPDR: 20.0000 mg | DELAYED_RELEASE_CAPSULE | Freq: Every day | ORAL | 0 refills | Status: DC

## 2017-05-19 MED ORDER — NAPROXEN 500 MG PO TABS: 500.0000 mg | ORAL_TABLET | Freq: Two times a day (BID) | ORAL | 0 refills | Status: DC

## 2017-05-19 NOTE — ED Notes (Signed)
Declined W/C at D/C and was escorted to lobby by RN. 

## 2017-05-19 NOTE — Discharge Instructions (Signed)
  All the results in the ER are normal, labs and imaging. We are not sure what is causing your symptoms. The workup in the ER is not complete, and is limited to screening for life threatening and emergent conditions only, so please see a primary care doctor for further evaluation.  Please return to the ER if your symptoms worsen; you have increased pain, fevers, chills, inability to keep any medications down, confusion. Otherwise see the outpatient doctor as requested.  

## 2017-05-19 NOTE — ED Triage Notes (Signed)
Pt arrives via POv from home with midback pain for the last 2 days. Denies recent injury, fever, dysuria. States sometimes it feels difficult to take a deep breathe. Pt also reports cough and chest pain with coughing. VSS. Pt reports pain worse with movement.

## 2017-05-19 NOTE — ED Provider Notes (Signed)
[facility]-EMERGENCY DEPT Provider Note   CSN: [id number] Arrival date & time: 05/19/17  1224     History   Chief Complaint Chief Complaint  Patient presents with  . Back Pain    HPI Eddie Smith is a 36 y.o. male.  HPI Patient with history of high blood pressure comes in with chief complaint of back pain and epigastric chest pain. Patient reports that the symptoms started 2 days ago. Patient has a constant mid back pain and epigastric chest pain that is worse with deep inspiration. Patient has associated cough and the pain gets worse with coughing. Patient denies any nausea, vomiting, fevers or chills. Patient has no dysuria, urinary frequency, hematuria. Patient has a cough, but it's not productive. Patient denies any trauma. No history of similar pain in the past. Pt has no hx of PE, DVT and denies any exogenous hormone (testosterone / estrogen) use, long distance travels or surgery in the past 6 weeks, active cancer, recent immobilization. Patient has history of heavy alcohol use, but he denies any heavy smoking or illicit substance abuse  Past Medical History:  Diagnosis Date  . Hypertension 04/03/2012  . Prostatitis     Patient Active Problem List   Diagnosis Date Noted  . History of acute prostatitis 04/21/2012  . Seasonal allergies 04/03/2012  . Hypertension 04/03/2012  . Hx of migraines 04/03/2012  . Sleep apnea, obstructive 04/03/2012  . GERD (gastroesophageal reflux disease)     No past surgical history on file.     Home Medications    Prior to Admission medications   Medication Sig Start Date End Date Taking? Authorizing Provider  benzonatate (TESSALON PERLES) 100 MG capsule Take 1 capsule (100 mg total) by mouth 3 (three) times daily as needed for cough. Patient not taking: Reported on 05/30/2015 05/11/15   Mackuen, Courteney Lyn, MD  chlordiazePOXIDE (LIBRIUM) 25 MG capsule  PO TID x 1D, then 25-50mg  PO BID X 1D, then 25-50mg  PO QD X 1D Patient not  taking: Reported on 05/19/2017 09/08/15   Jaynie Crumble, PA-C  dicyclomine (BENTYL) 20 MG tablet Take 1 tablet (20 mg total) by mouth 2 (two) times daily. Patient not taking: Reported on 05/19/2017 09/08/15   Jaynie Crumble, PA-C  doxycycline (VIBRAMYCIN) 100 MG capsule Take 1 capsule (100 mg total) by mouth 2 (two) times daily. Patient not taking: Reported on 05/19/2017 05/30/15   Deirdre Peer, MD  guaiFENesin-codeine 100-10 MG/5ML syrup Take 5 mLs by mouth every 6 (six) hours as needed for cough. Patient not taking: Reported on 05/30/2015 05/11/15   Mackuen, Cindee Salt, MD  HYDROcodone-acetaminophen (NORCO/VICODIN) 5-325 MG tablet Take 1 tablet by mouth every 4 (four) hours as needed. Patient not taking: Reported on 05/19/2017 05/30/15   Deirdre Peer, MD  ibuprofen (ADVIL,MOTRIN) 800 MG tablet Take 1 tablet (800 mg total) by mouth 3 (three) times daily. Patient not taking: Reported on 05/19/2017 08/14/15   Melton Krebs, PA-C  methocarbamol (ROBAXIN) 500 MG tablet Take 1 tablet (500 mg total) by mouth 2 (two) times daily. Patient not taking: Reported on 05/19/2017 09/08/15   Jaynie Crumble, PA-C  naproxen (NAPROSYN) 500 MG tablet Take 1 tablet (500 mg total) by mouth 2 (two) times daily. 05/19/17   Derwood Kaplan, MD  omeprazole (PRILOSEC) 20 MG capsule Take 1 capsule (20 mg total) by mouth daily. 05/19/17   Derwood Kaplan, MD  oseltamivir (TAMIFLU) 75 MG capsule Take 1 capsule (75 mg total) by mouth every 12 (twelve) hours. Patient not taking:  Reported on 05/19/2017 08/14/15   Melton Krebs, PA-C    Family History Family History  Problem Relation Age of Onset  . Asthma Brother   . Cancer Father   . Thyroid disease Mother   . Osteoarthritis Mother   . Hypertension Mother     Social History Social History  Substance Use Topics  . Smoking status: Current Some Day Smoker    Types: Cigarettes  . Smokeless tobacco: Not on file  . Alcohol use Yes      Comment: 1 - 40 oz beer q day     Allergies   Patient has no known allergies.   Review of Systems Review of Systems  Respiratory: Positive for cough.   Cardiovascular: Negative for chest pain.  Gastrointestinal: Positive for abdominal pain.  Musculoskeletal: Positive for back pain.  Allergic/Immunologic: Negative for immunocompromised state.  All other systems reviewed and are negative.    Physical Exam Updated Vital Signs BP 113/67   Pulse 68   Temp 98.4 F (36.9 C) (Oral)   Resp 16   Wt 65.8 kg (145 lb)   SpO2 97%   BMI 27.40 kg/m   Physical Exam  Constitutional: He is oriented to person, place, and time. He appears well-developed.  HENT:  Head: Atraumatic.  Neck: Neck supple.  Cardiovascular: Normal rate, normal heart sounds and intact distal pulses.  Exam reveals no gallop and no friction rub.   No murmur heard. Pulmonary/Chest: Effort normal. He exhibits no tenderness.  Abdominal: There is tenderness.  Patient has epigastric and generalized upper quadrant and upper back tenderness.  Pt has tenderness over the lumbar region No step offs, no erythema. Able to discriminate between sharp and dull. Able to ambulate   Neurological: He is alert and oriented to person, place, and time.  Skin: Skin is warm.  Nursing note and vitals reviewed.    ED Treatments / Results  Labs (all labs ordered are listed, but only abnormal results are displayed) Labs Reviewed  BASIC METABOLIC PANEL - Abnormal; Notable for the following:       Result Value   Chloride 99 (*)    Glucose, Bld 118 (*)    All other components within normal limits  CBC - Abnormal; Notable for the following:    MCV 100.9 (*)    All other components within normal limits  HEPATIC FUNCTION PANEL - Abnormal; Notable for the following:    ALT 12 (*)    All other components within normal limits  LIPASE, BLOOD  I-STAT TROPONIN, ED    EKG  EKG Interpretation  Date/Time:  Monday May 19 2017  13:11:36 EDT Ventricular Rate:  66 PR Interval:  130 QRS Duration: 82 QT Interval:  386 QTC Calculation: 404 R Axis:   65 Text Interpretation:  Normal sinus rhythm with sinus arrhythmia Normal ECG twi in infeior lead - not new No acute changes Confirmed by Derwood Kaplan [phone]) on 05/19/2017 4:01:09 PM       Radiology Dg Chest 2 View  Result Date: 05/19/2017 CLINICAL DATA:  Pt c/o generalized chest and upper back pain, SOB, nausea, and cough and congestion x 2 days. Pt states he cannot sleep at night due to the pain and SOB. Hx of HTN. Pt is a current smoker. EXAM: CHEST  2 VIEW COMPARISON:  08/07/2015 FINDINGS: Cardiac silhouette is normal in size. No mediastinal or hilar masses. No evidence of adenopathy. Lungs are clear.  No pleural effusion or pneumothorax. Skeletal structures are unremarkable. IMPRESSION:  No active cardiopulmonary disease. Electronically Signed   By: Amie Portland M.D.   On: 05/19/2017 13:35   US Abdomen Complete  Result Date: 05/19/2017 CLINICAL DATA:  36 year old male with abdominal pain. Initial encounter. EXAM: ABDOMEN ULTRASOUND COMPLETE COMPARISON:  None. FINDINGS: Gallbladder: No gallstones or wall thickening visualized. No sonographic Murphy sign noted by sonographer. Common bile duct: Diameter: 2.2 mm Liver: No focal lesion identified. Within normal limits in parenchymal echogenicity. Portal vein is patent on color Doppler imaging with normal direction of blood flow towards the liver. IVC: No abnormality visualized. Pancreas: Visualized portion unremarkable. Spleen: Not visualized. Right Kidney: Length: 12 cm. Echogenicity within normal limits. No mass or hydronephrosis visualized. Left Kidney: Length: 11.3 cm. Echogenicity within normal limits. No mass or hydronephrosis visualized. Abdominal aorta: No aneurysm visualized. Mid aspect of the abdominal aorta and bifurcation suboptimally evaluated secondary to bowel gas. Other findings: None. IMPRESSION: No acute  abnormality noted. Pancreatic tail, spleen and mid aspect of the abdominal aorta not well delineated secondary to bowel gas. Portions of the pancreas and aorta which are visualized are unremarkable. Electronically Signed   By: Lacy Duverney M.D.   On: 05/19/2017 17:16    Procedures Procedures (including critical care time)  Medications Ordered in ED Medications  gi cocktail (Maalox,Lidocaine,Donnatal) (30 mLs Oral Given 05/19/17 1719)     Initial Impression / Assessment and Plan / ED Course  I have reviewed the triage vital signs and the nursing notes.  Pertinent labs & imaging results that were available during my care of the patient were reviewed by me and considered in my medical decision making (see chart for details).     Patient comes in with chief complaint of epigastric abdominal pain, mid back pain. On exam he clearly has no chest wall pain however when he takes deep inspiration or coughs his existing pain does get worse. Patient is overall healthy gentleman, he does have a history of heavy drinking.  Differential diagnosis includes: PE Pneumothorax Musculoskeletal pain PUD / Gastritis / Esophagitis Esophageal spasm Pancreatitis ACS Pericarditis  Screening EKG is normal. Patient does not have positional pain, EKG shows no signs of pericarditis. Chest x-ray is normal and all the labs are reassuring.  Clinical gestalt for PE is low and patient is PERC negative.  Basic labs including LFTs and lipase and ultrasound abdomen are also normal. We will give patient anti-inflammatory and hold resolve. Strict return precautions have been discussed and patient is agreeable with the plan   Final Clinical Impressions(s) / ED Diagnoses   Final diagnoses:  Epigastric abdominal pain  Acute bilateral low back pain without sciatica    New Prescriptions New Prescriptions   NAPROXEN (NAPROSYN) 500 MG TABLET    Take 1 tablet (500 mg total) by mouth 2 (two) times daily.   OMEPRAZOLE  (PRILOSEC) 20 MG CAPSULE    Take 1 capsule (20 mg total) by mouth daily.     Derwood Kaplan, MD 05/19/17 1806

## 2018-12-27 ENCOUNTER — Emergency Department (HOSPITAL_COMMUNITY): Payer: Medicaid Other

## 2018-12-27 ENCOUNTER — Encounter (HOSPITAL_COMMUNITY): Payer: Self-pay | Admitting: Emergency Medicine

## 2018-12-27 ENCOUNTER — Other Ambulatory Visit: Payer: Self-pay

## 2018-12-27 ENCOUNTER — Emergency Department (HOSPITAL_COMMUNITY)
Admission: EM | Admit: 2018-12-27 | Discharge: 2018-12-27 | Disposition: A | Discharge status: Home / Self Care | Payer: Medicaid Other | Attending: Emergency Medicine | Admitting: Emergency Medicine

## 2018-12-27 DIAGNOSIS — Y999 Unspecified external cause status: Secondary | ICD-10-CM | POA: Insufficient documentation

## 2018-12-27 DIAGNOSIS — F17210 Nicotine dependence, cigarettes, uncomplicated: Secondary | ICD-10-CM | POA: Insufficient documentation

## 2018-12-27 DIAGNOSIS — S0502XA Injury of conjunctiva and corneal abrasion without foreign body, left eye, initial encounter: Secondary | ICD-10-CM | POA: Diagnosis not present

## 2018-12-27 DIAGNOSIS — M542 Cervicalgia: Secondary | ICD-10-CM | POA: Diagnosis not present

## 2018-12-27 DIAGNOSIS — S0240FA Zygomatic fracture, left side, initial encounter for closed fracture: Secondary | ICD-10-CM | POA: Insufficient documentation

## 2018-12-27 DIAGNOSIS — Y9389 Activity, other specified: Secondary | ICD-10-CM | POA: Diagnosis not present

## 2018-12-27 DIAGNOSIS — M545 Low back pain: Secondary | ICD-10-CM | POA: Diagnosis not present

## 2018-12-27 DIAGNOSIS — S060X9A Concussion with loss of consciousness of unspecified duration, initial encounter: Secondary | ICD-10-CM | POA: Insufficient documentation

## 2018-12-27 DIAGNOSIS — Y929 Unspecified place or not applicable: Secondary | ICD-10-CM | POA: Diagnosis not present

## 2018-12-27 DIAGNOSIS — R319 Hematuria, unspecified: Secondary | ICD-10-CM | POA: Insufficient documentation

## 2018-12-27 DIAGNOSIS — I10 Essential (primary) hypertension: Secondary | ICD-10-CM | POA: Diagnosis not present

## 2018-12-27 DIAGNOSIS — R109 Unspecified abdominal pain: Secondary | ICD-10-CM | POA: Diagnosis not present

## 2018-12-27 DIAGNOSIS — S0993XA Unspecified injury of face, initial encounter: Secondary | ICD-10-CM | POA: Diagnosis present

## 2018-12-27 DIAGNOSIS — R42 Dizziness and giddiness: Secondary | ICD-10-CM

## 2018-12-27 LAB — CBC WITH DIFFERENTIAL/PLATELET
Abs Immature Granulocytes: 0.03 10*3/uL (ref 0.00–0.07)
Basophils Absolute: 0 10*3/uL (ref 0.0–0.1)
Basophils Relative: 1 %
Eosinophils Absolute: 0.1 10*3/uL (ref 0.0–0.5)
Eosinophils Relative: 1 %
HCT: 47.4 % (ref 39.0–52.0)
Hemoglobin: 15.3 g/dL (ref 13.0–17.0)
Immature Granulocytes: 0 %
Lymphocytes Relative: 31 %
Lymphs Abs: 2.1 10*3/uL (ref 0.7–4.0)
MCH: 32.7 pg (ref 26.0–34.0)
MCHC: 32.3 g/dL (ref 30.0–36.0)
MCV: 101.3 fL — ABNORMAL HIGH (ref 80.0–100.0)
Monocytes Absolute: 1.1 10*3/uL — ABNORMAL HIGH (ref 0.1–1.0)
Monocytes Relative: 17 %
Neutro Abs: 3.4 10*3/uL (ref 1.7–7.7)
Neutrophils Relative %: 50 %
Platelets: 299 10*3/uL (ref 150–400)
RBC: 4.68 MIL/uL (ref 4.22–5.81)
RDW: 15.2 % (ref 11.5–15.5)
WBC: 6.8 10*3/uL (ref 4.0–10.5)
nRBC: 0 % (ref 0.0–0.2)

## 2018-12-27 LAB — COMPREHENSIVE METABOLIC PANEL
ALT: 28 U/L (ref 0–44)
AST: 40 U/L (ref 15–41)
Albumin: 4.5 g/dL (ref 3.5–5.0)
Alkaline Phosphatase: 57 U/L (ref 38–126)
Anion gap: 11 (ref 5–15)
BUN: 7 mg/dL (ref 6–20)
CO2: 25 mmol/L (ref 22–32)
Calcium: 9.3 mg/dL (ref 8.9–10.3)
Chloride: 104 mmol/L (ref 98–111)
Creatinine, Ser: 0.71 mg/dL (ref 0.61–1.24)
GFR calc Af Amer: >60 mL/min (ref >60)
GFR calc non Af Amer: >60 mL/min (ref >60)
Glucose, Bld: 94 mg/dL (ref 70–99)
Potassium: 3.8 mmol/L (ref 3.5–5.1)
Sodium: 140 mmol/L (ref 135–145)
Total Bilirubin: 0.5 mg/dL (ref 0.3–1.2)
Total Protein: 7.5 g/dL (ref 6.5–8.1)

## 2018-12-27 LAB — URINALYSIS, ROUTINE W REFLEX MICROSCOPIC
Bilirubin Urine: NEGATIVE
Glucose, UA: NEGATIVE mg/dL
Ketones, ur: 5 mg/dL — AB
Nitrite: NEGATIVE
Protein, ur: 100 mg/dL — AB
RBC / HPF: >50 RBC/hpf — ABNORMAL HIGH (ref 0–5)
Specific Gravity, Urine: 1.02 (ref 1.005–1.030)
WBC, UA: >50 WBC/hpf — ABNORMAL HIGH (ref 0–5)
pH: 6 (ref 5.0–8.0)

## 2018-12-27 MED ORDER — CIPROFLOXACIN HCL 500 MG PO TABS: 500.0000 mg | ORAL_TABLET | Freq: Two times a day (BID) | ORAL | 0 refills | Status: DC

## 2018-12-27 MED ORDER — IBUPROFEN 200 MG PO TABS
600.0000 mg | ORAL_TABLET | Freq: Once | ORAL | Status: AC
  Administered 2018-12-27: 14:00:00 600 mg via ORAL
  Filled 2018-12-27: qty 3

## 2018-12-27 MED ORDER — MECLIZINE HCL 25 MG PO TABS
25.0000 mg | ORAL_TABLET | Freq: Once | ORAL | Status: AC
  Administered 2018-12-27: 14:00:00 25 mg via ORAL
  Filled 2018-12-27: qty 1

## 2018-12-27 MED ORDER — SODIUM CHLORIDE 0.9 % IV BOLUS
1000.0000 mL | Freq: Once | INTRAVENOUS | Status: AC
  Administered 2018-12-27: 1000 mL via INTRAVENOUS

## 2018-12-27 MED ORDER — HYDROCODONE-ACETAMINOPHEN 5-325 MG PO TABS
1.0000 | ORAL_TABLET | Freq: Once | ORAL | Status: AC
  Administered 2018-12-27: 11:00:00 1 via ORAL
  Filled 2018-12-27: qty 1

## 2018-12-27 MED ORDER — FLUORESCEIN SODIUM 1 MG OP STRP
ORAL_STRIP | OPHTHALMIC | Status: AC
  Administered 2018-12-27: 11:00:00 1
  Filled 2018-12-27: qty 1

## 2018-12-27 MED ORDER — POLYMYXIN B-TRIMETHOPRIM 10000-0.1 UNIT/ML-% OP SOLN
2.0000 [drp] | Freq: Once | OPHTHALMIC | Status: AC
  Administered 2018-12-27: 13:00:00 2 [drp] via OPHTHALMIC
  Filled 2018-12-27: qty 10

## 2018-12-27 MED ORDER — IOHEXOL 300 MG/ML  SOLN
100.0000 mL | Freq: Once | INTRAMUSCULAR | Status: AC | PRN
  Administered 2018-12-27: 09:00:00 100 mL via INTRAVENOUS

## 2018-12-27 MED ORDER — MECLIZINE HCL 25 MG PO TABS: 25.0000 mg | ORAL_TABLET | Freq: Three times a day (TID) | ORAL | 0 refills | Status: DC | PRN

## 2018-12-27 MED ORDER — HYDROCODONE-ACETAMINOPHEN 5-325 MG PO TABS: 1.0000 | ORAL_TABLET | ORAL | 0 refills | Status: DC | PRN

## 2018-12-27 MED ORDER — TETRACAINE HCL 0.5 % OP SOLN
2.0000 [drp] | Freq: Once | OPHTHALMIC | Status: AC
  Administered 2018-12-27: 2 [drp] via OPHTHALMIC
  Filled 2018-12-27: qty 4

## 2018-12-27 MED ORDER — IBUPROFEN 600 MG PO TABS: 600.0000 mg | ORAL_TABLET | Freq: Four times a day (QID) | ORAL | 0 refills | Status: DC | PRN

## 2018-12-27 MED ORDER — SODIUM CHLORIDE (PF) 0.9 % IJ SOLN
INTRAMUSCULAR | Status: AC
  Filled 2018-12-27: qty 50

## 2018-12-27 MED ORDER — MORPHINE SULFATE (PF) 4 MG/ML IV SOLN
4.0000 mg | Freq: Once | INTRAVENOUS | Status: AC
  Administered 2018-12-27: 4 mg via INTRAVENOUS
  Filled 2018-12-27: qty 1

## 2018-12-27 MED ORDER — POLYMYXIN B-TRIMETHOPRIM 10000-0.1 UNIT/ML-% OP SOLN: 2.0000 [drp] | OPHTHALMIC | 0 refills | Status: AC

## 2018-12-27 MED ORDER — SODIUM CHLORIDE 0.9 % IV BOLUS
1000.0000 mL | Freq: Once | INTRAVENOUS | Status: AC
  Administered 2018-12-27: 11:00:00 1000 mL via INTRAVENOUS

## 2018-12-27 NOTE — ED Notes (Signed)
Pt requested assistance to restroom. Provided assistance OOB and out EDRm 21 to hallway restroom. Pt unsteady gait both to and from restroom. Pt c/o dizziness/lightheaded; paused x2 while ambulating. Royce RN advised. [employer]

## 2018-12-27 NOTE — ED Notes (Signed)
Patient ambulated to restroom with Nurse Tech with some assistance.
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
## 2018-12-28 LAB — URINE CULTURE

## 2018-12-28 LAB — GC/CHLAMYDIA PROBE AMP (~~LOC~~) NOT AT ARMC
Chlamydia: NEGATIVE
Neisseria Gonorrhea: NEGATIVE

## 2020-04-03 ENCOUNTER — Emergency Department (HOSPITAL_COMMUNITY)
Admission: EM | Admit: 2020-04-03 | Discharge: 2020-04-04 | Discharge status: Against Medical Advice | Payer: Medicaid Other

## 2020-04-03 ENCOUNTER — Other Ambulatory Visit: Payer: Self-pay

## 2020-04-03 NOTE — ED Notes (Signed)
Pt did not answer being called for triage

## 2020-08-27 ENCOUNTER — Emergency Department (HOSPITAL_COMMUNITY)
Admission: EM | Admit: 2020-08-27 | Discharge: 2020-08-28 | Disposition: A | Discharge status: Home / Self Care | Payer: Medicaid Other | Attending: Emergency Medicine | Admitting: Emergency Medicine

## 2020-08-27 ENCOUNTER — Other Ambulatory Visit: Payer: Self-pay

## 2020-08-27 ENCOUNTER — Encounter (HOSPITAL_COMMUNITY): Payer: Self-pay

## 2020-08-27 ENCOUNTER — Emergency Department (HOSPITAL_COMMUNITY): Payer: Medicaid Other

## 2020-08-27 DIAGNOSIS — R52 Pain, unspecified: Secondary | ICD-10-CM | POA: Diagnosis not present

## 2020-08-27 DIAGNOSIS — R6883 Chills (without fever): Secondary | ICD-10-CM | POA: Diagnosis not present

## 2020-08-27 DIAGNOSIS — R059 Cough, unspecified: Secondary | ICD-10-CM | POA: Insufficient documentation

## 2020-08-27 DIAGNOSIS — Z5321 Procedure and treatment not carried out due to patient leaving prior to being seen by health care provider: Secondary | ICD-10-CM | POA: Insufficient documentation

## 2020-08-27 LAB — CBC WITH DIFFERENTIAL/PLATELET
Abs Immature Granulocytes: 0.03 10*3/uL (ref 0.00–0.07)
Basophils Absolute: 0 10*3/uL (ref 0.0–0.1)
Basophils Relative: 0 %
Eosinophils Absolute: 0.1 10*3/uL (ref 0.0–0.5)
Eosinophils Relative: 1 %
HCT: 49.8 % (ref 39.0–52.0)
Hemoglobin: 16.9 g/dL (ref 13.0–17.0)
Immature Granulocytes: 0 %
Lymphocytes Relative: 19 %
Lymphs Abs: 1.8 10*3/uL (ref 0.7–4.0)
MCH: 33.7 pg (ref 26.0–34.0)
MCHC: 33.9 g/dL (ref 30.0–36.0)
MCV: 99.2 fL (ref 80.0–100.0)
Monocytes Absolute: 0.9 10*3/uL (ref 0.1–1.0)
Monocytes Relative: 10 %
Neutro Abs: 6.7 10*3/uL (ref 1.7–7.7)
Neutrophils Relative %: 70 %
Platelets: 316 10*3/uL (ref 150–400)
RBC: 5.02 MIL/uL (ref 4.22–5.81)
RDW: 12.9 % (ref 11.5–15.5)
WBC: 9.5 10*3/uL (ref 4.0–10.5)
nRBC: 0 % (ref 0.0–0.2)

## 2020-08-27 LAB — BASIC METABOLIC PANEL
Anion gap: 15 (ref 5–15)
BUN: 9 mg/dL (ref 6–20)
CO2: 25 mmol/L (ref 22–32)
Calcium: 9.6 mg/dL (ref 8.9–10.3)
Chloride: 100 mmol/L (ref 98–111)
Creatinine, Ser: 0.93 mg/dL (ref 0.61–1.24)
GFR, Estimated: >60 mL/min (ref >60)
Glucose, Bld: 86 mg/dL (ref 70–99)
Potassium: 4 mmol/L (ref 3.5–5.1)
Sodium: 140 mmol/L (ref 135–145)

## 2020-08-27 MED ORDER — ACETAMINOPHEN 325 MG PO TABS: 650.0000 mg | ORAL_TABLET | Freq: Once | ORAL | Status: DC

## 2020-08-27 NOTE — ED Triage Notes (Signed)
Patient complains of cough/ body aches and chills x 2 days. Patient states that he has been vaccinated but thinks he has covid. Alert and orinted

## 2022-01-17 ENCOUNTER — Emergency Department (HOSPITAL_COMMUNITY): Payer: Medicaid Other

## 2022-01-17 ENCOUNTER — Other Ambulatory Visit: Payer: Self-pay

## 2022-01-17 ENCOUNTER — Emergency Department (HOSPITAL_COMMUNITY)
Admission: EM | Admit: 2022-01-17 | Discharge: 2022-01-17 | Disposition: A | Discharge status: Home / Self Care | Payer: Medicaid Other | Attending: Student | Admitting: Student

## 2022-01-17 ENCOUNTER — Encounter (HOSPITAL_COMMUNITY): Payer: Self-pay | Admitting: Emergency Medicine

## 2022-01-17 DIAGNOSIS — I10 Essential (primary) hypertension: Secondary | ICD-10-CM | POA: Diagnosis not present

## 2022-01-17 DIAGNOSIS — B349 Viral infection, unspecified: Secondary | ICD-10-CM | POA: Diagnosis not present

## 2022-01-17 DIAGNOSIS — F17210 Nicotine dependence, cigarettes, uncomplicated: Secondary | ICD-10-CM | POA: Diagnosis not present

## 2022-01-17 DIAGNOSIS — R059 Cough, unspecified: Secondary | ICD-10-CM | POA: Diagnosis present

## 2022-01-17 DIAGNOSIS — R079 Chest pain, unspecified: Secondary | ICD-10-CM | POA: Diagnosis not present

## 2022-01-17 DIAGNOSIS — Z20822 Contact with and (suspected) exposure to covid-19: Secondary | ICD-10-CM | POA: Diagnosis not present

## 2022-01-17 LAB — COMPREHENSIVE METABOLIC PANEL
ALT: 14 U/L (ref 0–44)
AST: 25 U/L (ref 15–41)
Albumin: 4 g/dL (ref 3.5–5.0)
Alkaline Phosphatase: 59 U/L (ref 38–126)
Anion gap: 10 (ref 5–15)
BUN: 10 mg/dL (ref 6–20)
CO2: 25 mmol/L (ref 22–32)
Calcium: 9.7 mg/dL (ref 8.9–10.3)
Chloride: 101 mmol/L (ref 98–111)
Creatinine, Ser: 0.79 mg/dL (ref 0.61–1.24)
GFR, Estimated: >60 mL/min (ref >60)
Glucose, Bld: 81 mg/dL (ref 70–99)
Potassium: 4.1 mmol/L (ref 3.5–5.1)
Sodium: 136 mmol/L (ref 135–145)
Total Bilirubin: 1.6 mg/dL — ABNORMAL HIGH (ref 0.3–1.2)
Total Protein: 7.2 g/dL (ref 6.5–8.1)

## 2022-01-17 LAB — CBC WITH DIFFERENTIAL/PLATELET
Abs Immature Granulocytes: 0.03 10*3/uL (ref 0.00–0.07)
Basophils Absolute: 0 10*3/uL (ref 0.0–0.1)
Basophils Relative: 1 %
Eosinophils Absolute: 0.1 10*3/uL (ref 0.0–0.5)
Eosinophils Relative: 1 %
HCT: 46 % (ref 39.0–52.0)
Hemoglobin: 15.3 g/dL (ref 13.0–17.0)
Immature Granulocytes: 0 %
Lymphocytes Relative: 31 %
Lymphs Abs: 2.5 10*3/uL (ref 0.7–4.0)
MCH: 32.8 pg (ref 26.0–34.0)
MCHC: 33.3 g/dL (ref 30.0–36.0)
MCV: 98.5 fL (ref 80.0–100.0)
Monocytes Absolute: 1.1 10*3/uL — ABNORMAL HIGH (ref 0.1–1.0)
Monocytes Relative: 13 %
Neutro Abs: 4.3 10*3/uL (ref 1.7–7.7)
Neutrophils Relative %: 54 %
Platelets: 270 10*3/uL (ref 150–400)
RBC: 4.67 MIL/uL (ref 4.22–5.81)
RDW: 15.2 % (ref 11.5–15.5)
WBC: 8 10*3/uL (ref 4.0–10.5)
nRBC: 0 % (ref 0.0–0.2)

## 2022-01-17 LAB — TROPONIN I (HIGH SENSITIVITY): Troponin I (High Sensitivity): 6 ng/L (ref <18)

## 2022-01-17 LAB — RESP PANEL BY RT-PCR (FLU A&B, COVID) ARPGX2
Influenza A by PCR: NEGATIVE
Influenza B by PCR: NEGATIVE
SARS Coronavirus 2 by RT PCR: NEGATIVE

## 2022-01-17 MED ORDER — ONDANSETRON HCL 4 MG/2ML IJ SOLN
4.0000 mg | Freq: Once | INTRAMUSCULAR | Status: AC
  Administered 2022-01-17: 4 mg via INTRAVENOUS
  Filled 2022-01-17: qty 2

## 2022-01-17 MED ORDER — KETOROLAC TROMETHAMINE 15 MG/ML IJ SOLN
15.0000 mg | Freq: Once | INTRAMUSCULAR | Status: AC
  Administered 2022-01-17: 15 mg via INTRAVENOUS
  Filled 2022-01-17: qty 1

## 2022-01-17 MED ORDER — NAPROXEN 375 MG PO TABS: 375.0000 mg | ORAL_TABLET | Freq: Two times a day (BID) | ORAL | 0 refills | Status: DC

## 2022-01-17 MED ORDER — LACTATED RINGERS IV BOLUS
1000.0000 mL | Freq: Once | INTRAVENOUS | Status: AC
  Administered 2022-01-17: 1000 mL via INTRAVENOUS

## 2022-01-17 MED ORDER — ONDANSETRON 4 MG PO TBDP: 4.0000 mg | ORAL_TABLET | Freq: Three times a day (TID) | ORAL | 0 refills | Status: DC | PRN

## 2022-01-17 NOTE — ED Provider Notes (Signed)
[facility]  CSN: [id number] Arrival date & time: 01/17/22 1109  Chief Complaint(s) Vomiting  HPI Eddie Smith is a 41 y.o. male with PMH prostatitis, HTN who presents emergency department for evaluation of nausea, vomiting, chills, cough and chest pain.  Patient states that for the last 48 hours he has had slowly worsening myalgias, loss of taste and smell, vomiting and diarrhea.  He states that 2 months ago he was diagnosed with COVID and this feels very similar to that.  He endorses sharp chest pain worse with coughing but denies shortness of breath, diaphoresis or exertional component to this pain.  He states he is having difficulty tolerating p.o. secondary to his current symptoms.   Past Medical History Past Medical History:  Diagnosis Date   Hypertension 04/03/2012   Prostatitis    Patient Active Problem List   Diagnosis Date Noted   History of acute prostatitis 04/21/2012   Seasonal allergies 04/03/2012   Hypertension 04/03/2012   Hx of migraines 04/03/2012   Sleep apnea, obstructive 04/03/2012   GERD (gastroesophageal reflux disease)    Home Medication(s) Prior to Admission medications   Medication Sig Start Date End Date Taking? Authorizing Provider  naproxen (NAPROSYN) 375 MG tablet Take 1 tablet (375 mg total) by mouth 2 (two) times daily. 01/17/22  Yes Tywanda Rice, MD  ondansetron (ZOFRAN-ODT) 4 MG disintegrating tablet Take 1 tablet (4 mg total) by mouth every 8 (eight) hours as needed for nausea or vomiting. 01/17/22  Yes Adalis Gatti, MD                                                                                                                                    Past Surgical History History reviewed. No pertinent surgical history. Family History Family History  Problem Relation Age of Onset   Asthma Brother    Cancer Father    Thyroid disease Mother    Osteoarthritis Mother    Hypertension  Mother     Social History Social History   Tobacco Use   Smoking status: Some Days    Packs/day: 0.50    Years: 20.00    Total pack years: 10.00    Types: Cigarettes  Substance Use Topics   Alcohol use: Yes    Comment: 1 - 40 oz beer q day   Drug use: Yes    Comment: molly and opiates   Allergies Patient has no known allergies.  Review of Systems Review of Systems  Constitutional:  Positive for chills and fatigue.  Gastrointestinal:  Positive for diarrhea, nausea and vomiting.  Musculoskeletal:  Positive for arthralgias and myalgias.    Physical Exam Vital Signs  I have reviewed the triage vital signs BP 110/70   Pulse 62   Temp 98.7 F (37.1 C) (Oral)   Resp 16   SpO2 93%   Physical Exam Vitals and nursing note reviewed.  Constitutional:      General: He is not in acute distress.    Appearance: He is well-developed.  HENT:     Head: Normocephalic and atraumatic.  Eyes:     Conjunctiva/sclera: Conjunctivae normal.  Cardiovascular:     Rate and Rhythm: Normal rate and regular rhythm.     Heart sounds: No murmur heard. Pulmonary:     Effort: Pulmonary effort is normal. No respiratory distress.     Breath sounds: Normal breath sounds.  Abdominal:     Palpations: Abdomen is soft.     Tenderness: There is no abdominal tenderness.  Musculoskeletal:        General: No swelling.     Cervical back: Neck supple.  Skin:    General: Skin is warm and dry.     Capillary Refill: Capillary refill takes less than 2 seconds.  Neurological:     Mental Status: He is alert.  Psychiatric:        Mood and Affect: Mood normal.     ED Results and Treatments Labs (all labs ordered are listed, but only abnormal results are displayed) Labs Reviewed  COMPREHENSIVE METABOLIC PANEL - Abnormal; Notable for the following components:      Result Value   Total Bilirubin 1.6 (*)    All other components within normal limits  CBC WITH DIFFERENTIAL/PLATELET - Abnormal; Notable  for the following components:   Monocytes Absolute 1.1 (*)    All other components within normal limits  RESP PANEL BY RT-PCR (FLU A&B, COVID) ARPGX2  TROPONIN I (HIGH SENSITIVITY)                                                                                                                          Radiology DG Chest 2 View  Result Date: 01/17/2022 CLINICAL DATA:  Chest pain. EXAM: CHEST - 2 VIEW COMPARISON:  August 27, 2020 FINDINGS: The heart size and mediastinal contours are within normal limits. Both lungs are clear. The visualized skeletal structures are unremarkable. IMPRESSION: No active cardiopulmonary disease. Electronically Signed   By: Aram Candela M.D.   On: 01/17/2022 20:30    Pertinent labs & imaging results that were available during my care of the patient were reviewed by me and considered in my medical decision making (see MDM for details).  Medications Ordered in ED Medications  lactated ringers bolus 1,000 mL (0 mLs Intravenous Stopped 01/17/22 2209)  ketorolac (TORADOL) 15 MG/ML injection 15 mg (15 mg Intravenous Given 01/17/22 2207)  ondansetron (ZOFRAN) injection 4 mg (4 mg Intravenous Given 01/17/22 2206)  Procedures Procedures  (including critical care time)  Medical Decision Making / ED Course   This patient presents to the ED for concern of vomiting, diarrhea, myalgias, chest pain, this involves an extensive number of treatment options, and is a complaint that carries with it a high risk of complications and morbidity.  The differential diagnosis includes viral illness, COVID-19, influenza, pneumonia, ACS  MDM: Patient seen in the emergency room for evaluation of multiple complaints described above.  Physical exam is unremarkable all benign and soft abdomen.  Laboratory evaluation unremarkable including no leukocytosis.   High-sensitivity troponin is negative.  COVID and flu is negative.  Chest x-ray unremarkable.  Patient given Toradol, fluids and Zofran and on reevaluation, his symptoms have improved.  He is able to tolerate p.o. without difficulty.  He states his chest pain is resolved.  I have low suspicion for ACS in this patient as his symptoms appear to be directly related to myalgias felt an every other muscle group and his chest pain is likely muscular.  This is backed up by a negative troponin.  Patient presentation appears to be consistent with a viral illness and he was discharged with a prescription for ODT Zofran and Naprosyn.   Additional history obtained:  -External records from outside source obtained and reviewed including: Chart review including previous notes, labs, imaging, consultation notes   Lab Tests: -I ordered, reviewed, and interpreted labs.   The pertinent results include:   Labs Reviewed  COMPREHENSIVE METABOLIC PANEL - Abnormal; Notable for the following components:      Result Value   Total Bilirubin 1.6 (*)    All other components within normal limits  CBC WITH DIFFERENTIAL/PLATELET - Abnormal; Notable for the following components:   Monocytes Absolute 1.1 (*)    All other components within normal limits  RESP PANEL BY RT-PCR (FLU A&B, COVID) ARPGX2  TROPONIN I (HIGH SENSITIVITY)    Imaging Studies ordered: I ordered imaging studies including CXR I independently visualized and interpreted imaging. I agree with the radiologist interpretation   Medicines ordered and prescription drug management: Meds ordered this encounter  Medications   lactated ringers bolus 1,000 mL   ketorolac (TORADOL) 15 MG/ML injection 15 mg   ondansetron (ZOFRAN) injection 4 mg   naproxen (NAPROSYN) 375 MG tablet    Sig: Take 1 tablet (375 mg total) by mouth 2 (two) times daily.    Dispense:  20 tablet    Refill:  0   ondansetron (ZOFRAN-ODT) 4 MG disintegrating tablet    Sig: Take 1  tablet (4 mg total) by mouth every 8 (eight) hours as needed for nausea or vomiting.    Dispense:  20 tablet    Refill:  0    -I have reviewed the patients home medicines and have made adjustments as needed  Critical interventions none     Cardiac Monitoring: The patient was maintained on a cardiac monitor.  I personally viewed and interpreted the cardiac monitored which showed an underlying rhythm of: NSR  Social Determinants of Health:  Factors impacting patients care include: none   Reevaluation: After the interventions noted above, I reevaluated the patient and found that they have :improved  Co morbidities that complicate the patient evaluation  Past Medical History:  Diagnosis Date   Hypertension 04/03/2012   Prostatitis       Dispostion: I considered admission for this patient, but with negative work-up and improved symptoms he is safe for discharge with outpatient follow-up  Final Clinical Impression(s) / ED Diagnoses Final diagnoses:  Viral illness     @    Glendora Score, MD 01/17/22 2332

## 2022-01-17 NOTE — ED Triage Notes (Signed)
Pt states he feels sick- n/v, fevers. Pt lost taste and smell.

## 2022-01-17 NOTE — ED Provider Triage Note (Signed)
Emergency Medicine Provider Triage Evaluation Note  Eddie Smith , a 41 y.o. male  was evaluated in triage.  Pt complains of nausea, vomiting, diarrhea, body aches, loss of taste and smell.  Happened 2 days ago.  Feels like COVID.Marland Kitchen  Review of Systems  Per HPI   Physical Exam  BP 118/76 (BP Location: Right Arm)   Pulse 88   Temp 98.7 F (37.1 C) (Oral)   Resp 16   SpO2 99%  Gen:   Awake, no distress   Resp:  Normal effort  MSK:   Moves extremities without difficulty  Other:  S1-S2, lungs are clear to auscultation bilaterally.  Abdomen is soft and nontender  Medical Decision Making  Medically screening exam initiated at 12:07 PM.  Appropriate orders placed.  [facility] was informed that the remainder of the evaluation will be completed by another provider, this initial triage assessment does not replace that evaluation, and the importance of remaining in the ED until their evaluation is complete.     Theron Arista, PA-C 01/17/22 1208

## 2022-01-20 ENCOUNTER — Inpatient Hospital Stay (HOSPITAL_COMMUNITY)
Admission: EM | Admit: 2022-01-20 | Discharge: 2022-01-23 | DRG: 917 | Disposition: A | Discharge status: Home / Self Care | Payer: Medicaid Other | Attending: Internal Medicine | Admitting: Internal Medicine

## 2022-01-20 DIAGNOSIS — F1910 Other psychoactive substance abuse, uncomplicated: Secondary | ICD-10-CM

## 2022-01-20 DIAGNOSIS — J9601 Acute respiratory failure with hypoxia: Principal | ICD-10-CM

## 2022-01-20 DIAGNOSIS — R55 Syncope and collapse: Secondary | ICD-10-CM

## 2022-01-20 DIAGNOSIS — F1010 Alcohol abuse, uncomplicated: Secondary | ICD-10-CM

## 2022-01-20 DIAGNOSIS — Z720 Tobacco use: Secondary | ICD-10-CM

## 2022-01-20 DIAGNOSIS — J189 Pneumonia, unspecified organism: Secondary | ICD-10-CM

## 2022-01-20 DIAGNOSIS — T50901A Poisoning by unspecified drugs, medicaments and biological substances, accidental (unintentional), initial encounter: Secondary | ICD-10-CM

## 2022-01-20 DIAGNOSIS — F32A Depression, unspecified: Secondary | ICD-10-CM

## 2022-01-20 DIAGNOSIS — F10129 Alcohol abuse with intoxication, unspecified: Secondary | ICD-10-CM | POA: Diagnosis present

## 2022-01-20 DIAGNOSIS — Y906 Blood alcohol level of 120-199 mg/100 ml: Secondary | ICD-10-CM | POA: Diagnosis present

## 2022-01-20 DIAGNOSIS — G929 Unspecified toxic encephalopathy: Secondary | ICD-10-CM | POA: Diagnosis present

## 2022-01-20 DIAGNOSIS — G9340 Encephalopathy, unspecified: Secondary | ICD-10-CM

## 2022-01-20 DIAGNOSIS — E860 Dehydration: Secondary | ICD-10-CM | POA: Diagnosis present

## 2022-01-20 DIAGNOSIS — Z23 Encounter for immunization: Secondary | ICD-10-CM

## 2022-01-20 DIAGNOSIS — F1990 Other psychoactive substance use, unspecified, uncomplicated: Secondary | ICD-10-CM

## 2022-01-20 DIAGNOSIS — F1410 Cocaine abuse, uncomplicated: Secondary | ICD-10-CM | POA: Diagnosis present

## 2022-01-20 DIAGNOSIS — Y92524 Gas station as the place of occurrence of the external cause: Secondary | ICD-10-CM

## 2022-01-20 HISTORY — DX: Alcohol abuse, uncomplicated: F10.10

## 2022-01-20 NOTE — ED Triage Notes (Signed)
Pt bib by [facility]s from a gas station. Overdose was called in by a bystander, apneic upon ems arrival. pt given 1mg  IV narcan. Pt arousable tactile stimulation. O2 sats were dropping to 90%. Pt put on 2L [place]

## 2022-01-21 ENCOUNTER — Emergency Department (HOSPITAL_COMMUNITY): Payer: Medicaid Other

## 2022-01-21 ENCOUNTER — Other Ambulatory Visit: Payer: Self-pay

## 2022-01-21 ENCOUNTER — Encounter (HOSPITAL_COMMUNITY): Payer: Self-pay

## 2022-01-21 ENCOUNTER — Other Ambulatory Visit (HOSPITAL_COMMUNITY): Payer: Medicaid Other

## 2022-01-21 DIAGNOSIS — J189 Pneumonia, unspecified organism: Secondary | ICD-10-CM | POA: Diagnosis not present

## 2022-01-21 DIAGNOSIS — F1010 Alcohol abuse, uncomplicated: Secondary | ICD-10-CM

## 2022-01-21 DIAGNOSIS — R55 Syncope and collapse: Secondary | ICD-10-CM

## 2022-01-21 DIAGNOSIS — F1910 Other psychoactive substance abuse, uncomplicated: Secondary | ICD-10-CM

## 2022-01-21 DIAGNOSIS — F32A Depression, unspecified: Secondary | ICD-10-CM

## 2022-01-21 DIAGNOSIS — T50901A Poisoning by unspecified drugs, medicaments and biological substances, accidental (unintentional), initial encounter: Secondary | ICD-10-CM

## 2022-01-21 DIAGNOSIS — Z720 Tobacco use: Secondary | ICD-10-CM

## 2022-01-21 DIAGNOSIS — Z23 Encounter for immunization: Secondary | ICD-10-CM | POA: Diagnosis not present

## 2022-01-21 DIAGNOSIS — F1410 Cocaine abuse, uncomplicated: Secondary | ICD-10-CM | POA: Diagnosis present

## 2022-01-21 DIAGNOSIS — F10129 Alcohol abuse with intoxication, unspecified: Secondary | ICD-10-CM | POA: Diagnosis present

## 2022-01-21 DIAGNOSIS — G929 Unspecified toxic encephalopathy: Secondary | ICD-10-CM | POA: Diagnosis present

## 2022-01-21 DIAGNOSIS — F1990 Other psychoactive substance use, unspecified, uncomplicated: Secondary | ICD-10-CM

## 2022-01-21 DIAGNOSIS — E860 Dehydration: Secondary | ICD-10-CM | POA: Diagnosis present

## 2022-01-21 DIAGNOSIS — Y92524 Gas station as the place of occurrence of the external cause: Secondary | ICD-10-CM | POA: Diagnosis not present

## 2022-01-21 DIAGNOSIS — Y906 Blood alcohol level of 120-199 mg/100 ml: Secondary | ICD-10-CM | POA: Diagnosis present

## 2022-01-21 DIAGNOSIS — J9601 Acute respiratory failure with hypoxia: Secondary | ICD-10-CM | POA: Diagnosis present

## 2022-01-21 LAB — CREATININE, SERUM
Creatinine, Ser: 0.7 mg/dL (ref 0.61–1.24)
GFR, Estimated: >60 mL/min (ref >60)

## 2022-01-21 LAB — CBC WITH DIFFERENTIAL/PLATELET
Abs Immature Granulocytes: 0.14 10*3/uL — ABNORMAL HIGH (ref 0.00–0.07)
Basophils Absolute: 0 10*3/uL (ref 0.0–0.1)
Basophils Relative: 0 %
Eosinophils Absolute: 0 10*3/uL (ref 0.0–0.5)
Eosinophils Relative: 0 %
HCT: 43.6 % (ref 39.0–52.0)
Hemoglobin: 14.6 g/dL (ref 13.0–17.0)
Immature Granulocytes: 1 %
Lymphocytes Relative: 4 %
Lymphs Abs: 0.7 10*3/uL (ref 0.7–4.0)
MCH: 33.7 pg (ref 26.0–34.0)
MCHC: 33.5 g/dL (ref 30.0–36.0)
MCV: 100.7 fL — ABNORMAL HIGH (ref 80.0–100.0)
Monocytes Absolute: 0.9 10*3/uL (ref 0.1–1.0)
Monocytes Relative: 6 %
Neutro Abs: 13.9 10*3/uL — ABNORMAL HIGH (ref 1.7–7.7)
Neutrophils Relative %: 89 %
Platelets: 207 10*3/uL (ref 150–400)
RBC: 4.33 MIL/uL (ref 4.22–5.81)
RDW: 15 % (ref 11.5–15.5)
WBC: 15.7 10*3/uL — ABNORMAL HIGH (ref 4.0–10.5)
nRBC: 0 % (ref 0.0–0.2)

## 2022-01-21 LAB — ETHANOL: Alcohol, Ethyl (B): 181 mg/dL — ABNORMAL HIGH (ref <10)

## 2022-01-21 LAB — COMPREHENSIVE METABOLIC PANEL
ALT: 18 U/L (ref 0–44)
AST: 32 U/L (ref 15–41)
Albumin: 4.2 g/dL (ref 3.5–5.0)
Alkaline Phosphatase: 59 U/L (ref 38–126)
Anion gap: 11 (ref 5–15)
BUN: 9 mg/dL (ref 6–20)
CO2: 26 mmol/L (ref 22–32)
Calcium: 8.6 mg/dL — ABNORMAL LOW (ref 8.9–10.3)
Chloride: 104 mmol/L (ref 98–111)
Creatinine, Ser: 0.82 mg/dL (ref 0.61–1.24)
GFR, Estimated: >60 mL/min (ref >60)
Glucose, Bld: 124 mg/dL — ABNORMAL HIGH (ref 70–99)
Potassium: 3.7 mmol/L (ref 3.5–5.1)
Sodium: 141 mmol/L (ref 135–145)
Total Bilirubin: 0.2 mg/dL — ABNORMAL LOW (ref 0.3–1.2)
Total Protein: 7.5 g/dL (ref 6.5–8.1)

## 2022-01-21 LAB — HIV ANTIBODY (ROUTINE TESTING W REFLEX): HIV Screen 4th Generation wRfx: NONREACTIVE

## 2022-01-21 LAB — CBC
HCT: 42.2 % (ref 39.0–52.0)
Hemoglobin: 13.9 g/dL (ref 13.0–17.0)
MCH: 33 pg (ref 26.0–34.0)
MCHC: 32.9 g/dL (ref 30.0–36.0)
MCV: 100.2 fL — ABNORMAL HIGH (ref 80.0–100.0)
Platelets: 188 10*3/uL (ref 150–400)
RBC: 4.21 MIL/uL — ABNORMAL LOW (ref 4.22–5.81)
RDW: 15.1 % (ref 11.5–15.5)
WBC: 8.9 10*3/uL (ref 4.0–10.5)
nRBC: 0 % (ref 0.0–0.2)

## 2022-01-21 LAB — RAPID URINE DRUG SCREEN, HOSP PERFORMED
Amphetamines: NOT DETECTED
Barbiturates: NOT DETECTED
Benzodiazepines: NOT DETECTED
Cocaine: POSITIVE — AB
Opiates: NOT DETECTED
Tetrahydrocannabinol: NOT DETECTED

## 2022-01-21 LAB — CBG MONITORING, ED: Glucose-Capillary: 114 mg/dL — ABNORMAL HIGH (ref 70–99)

## 2022-01-21 MED ORDER — LACTATED RINGERS IV BOLUS
1000.0000 mL | Freq: Once | INTRAVENOUS | Status: AC
Start: 2022-01-21 — End: 2022-01-21
  Administered 2022-01-21: 1000 mL via INTRAVENOUS

## 2022-01-21 MED ORDER — GUAIFENESIN ER 600 MG PO TB12
600.0000 mg | ORAL_TABLET | Freq: Two times a day (BID) | ORAL | Status: DC
  Administered 2022-01-21 – 2022-01-23 (×5): 600 mg via ORAL
  Filled 2022-01-21 (×5): qty 1

## 2022-01-21 MED ORDER — AZITHROMYCIN 250 MG PO TABS
500.0000 mg | ORAL_TABLET | Freq: Once | ORAL | Status: AC
  Administered 2022-01-21: 500 mg via ORAL
  Filled 2022-01-21: qty 2

## 2022-01-21 MED ORDER — NICOTINE 14 MG/24HR TD PT24
14.0000 mg | MEDICATED_PATCH | Freq: Every day | TRANSDERMAL | Status: DC
  Filled 2022-01-21 (×2): qty 1

## 2022-01-21 MED ORDER — LORAZEPAM 1 MG PO TABS
1.0000 mg | ORAL_TABLET | ORAL | Status: DC | PRN
  Administered 2022-01-21: 2 mg via ORAL
  Filled 2022-01-21: qty 2

## 2022-01-21 MED ORDER — THIAMINE HCL 100 MG PO TABS
100.0000 mg | ORAL_TABLET | Freq: Every day | ORAL | Status: DC
  Administered 2022-01-21 – 2022-01-23 (×3): 100 mg via ORAL
  Filled 2022-01-21 (×3): qty 1

## 2022-01-21 MED ORDER — ONDANSETRON HCL 4 MG/2ML IJ SOLN
4.0000 mg | Freq: Four times a day (QID) | INTRAMUSCULAR | Status: DC | PRN
  Administered 2022-01-21: 4 mg via INTRAVENOUS
  Filled 2022-01-21: qty 2

## 2022-01-21 MED ORDER — FOLIC ACID 1 MG PO TABS
1.0000 mg | ORAL_TABLET | Freq: Every day | ORAL | Status: DC
  Administered 2022-01-21 – 2022-01-23 (×3): 1 mg via ORAL
  Filled 2022-01-21 (×3): qty 1

## 2022-01-21 MED ORDER — ACETAMINOPHEN 650 MG RE SUPP: 650.0000 mg | Freq: Four times a day (QID) | RECTAL | Status: DC | PRN

## 2022-01-21 MED ORDER — ONDANSETRON HCL 4 MG/2ML IJ SOLN
4.0000 mg | Freq: Once | INTRAMUSCULAR | Status: AC
  Administered 2022-01-21: 4 mg via INTRAVENOUS
  Filled 2022-01-21: qty 2

## 2022-01-21 MED ORDER — SODIUM CHLORIDE 0.9 % IV SOLN
1.0000 g | Freq: Once | INTRAVENOUS | Status: AC
  Administered 2022-01-21: 1 g via INTRAVENOUS
  Filled 2022-01-21: qty 10

## 2022-01-21 MED ORDER — ACETAMINOPHEN 325 MG PO TABS
650.0000 mg | ORAL_TABLET | Freq: Four times a day (QID) | ORAL | Status: DC | PRN
  Administered 2022-01-21 – 2022-01-22 (×4): 650 mg via ORAL
  Filled 2022-01-21 (×4): qty 2

## 2022-01-21 MED ORDER — SODIUM CHLORIDE 0.9 % IV SOLN
500.0000 mg | INTRAVENOUS | Status: DC
  Administered 2022-01-22 – 2022-01-23 (×2): 500 mg via INTRAVENOUS
  Filled 2022-01-21 (×2): qty 5

## 2022-01-21 MED ORDER — ADULT MULTIVITAMIN W/MINERALS CH
1.0000 | ORAL_TABLET | Freq: Every day | ORAL | Status: DC
Start: 2022-01-21 — End: 2022-01-23
  Administered 2022-01-21 – 2022-01-23 (×3): 1 via ORAL
  Filled 2022-01-21 (×3): qty 1

## 2022-01-21 MED ORDER — ALBUTEROL SULFATE (2.5 MG/3ML) 0.083% IN NEBU
2.5000 mg | INHALATION_SOLUTION | Freq: Four times a day (QID) | RESPIRATORY_TRACT | Status: DC | PRN
Start: 2022-01-21 — End: 2022-01-23

## 2022-01-21 MED ORDER — PNEUMOCOCCAL 20-VAL CONJ VACC 0.5 ML IM SUSY
0.5000 mL | PREFILLED_SYRINGE | INTRAMUSCULAR | Status: AC
  Administered 2022-01-23: 0.5 mL via INTRAMUSCULAR
  Filled 2022-01-21: qty 0.5

## 2022-01-21 MED ORDER — SODIUM CHLORIDE 0.9 % IV SOLN
2.0000 g | INTRAVENOUS | Status: DC
  Administered 2022-01-22 – 2022-01-23 (×2): 2 g via INTRAVENOUS
  Filled 2022-01-21 (×2): qty 20

## 2022-01-21 MED ORDER — LORAZEPAM 2 MG/ML IJ SOLN: 1.0000 mg | INTRAMUSCULAR | Status: DC | PRN

## 2022-01-21 MED ORDER — ENOXAPARIN SODIUM 40 MG/0.4ML IJ SOSY
40.0000 mg | PREFILLED_SYRINGE | INTRAMUSCULAR | Status: DC
  Administered 2022-01-21 – 2022-01-22 (×2): 40 mg via SUBCUTANEOUS
  Filled 2022-01-21 (×2): qty 0.4

## 2022-01-21 MED ORDER — THIAMINE HCL 100 MG/ML IJ SOLN: 100.0000 mg | Freq: Every day | INTRAMUSCULAR | Status: DC

## 2022-01-21 MED ORDER — ONDANSETRON HCL 4 MG PO TABS: 4.0000 mg | ORAL_TABLET | Freq: Four times a day (QID) | ORAL | Status: DC | PRN

## 2022-01-21 NOTE — ED Notes (Signed)
Pt assisted to side of bed. Pt visibly dizzy. Pt vomited times one. Provider notified

## 2022-01-21 NOTE — ED Provider Notes (Signed)
[place] DEPT Provider Note   CSN: [id number] Arrival date & time: 01/20/22  2353     History  Chief Complaint  Patient presents with   Drug Overdose   Level 5 caveat due to altered mental status Eddie Smith is a 41 y.o. male.  The history is provided by the EMS personnel. The history is limited by the condition of the patient.  Drug Overdose  Patient brought in by EMS.  Apparently he was found unresponsive at a gas station.  Patient was found apneic, given 1 mg of Narcan and he began to wake up.  However since that time he has become more drowsy and is requiring oxygen.  No other details are known on arrival     Home Medications Prior to Admission medications   Not on File      Allergies    Patient has no allergy information on record.    Review of Systems   Review of Systems  Unable to perform ROS: Mental status change    Physical Exam Updated Vital Signs BP 135/87   Pulse 77   Temp (!) 97.5 F (36.4 C) (Oral)   Resp (!) 26   SpO2 91%  Physical Exam CONSTITUTIONAL: Disheveled, resting comfortably HEAD: Normocephalic/atraumatic, no visible trauma EYES: Pupils pinpoint bilaterally ENMT: Mucous membranes moist NECK: supple no meningeal signs CV: S1/S2 noted, no murmurs/rubs/gallops noted LUNGS: Decreased breath sounds bilaterally, on 2 L nasal cannula ABDOMEN: soft, nondistended NEURO: Pt is somnolent but arousable to voice and pain.  He makes incomprehensible sounds when he tries to speak EXTREMITIES: pulses normal/equal, no visible trauma SKIN: warm, color normal PSYCH: Unable to assess  ED Results / Procedures / Treatments   Labs (all labs ordered are listed, but only abnormal results are displayed) Labs Reviewed  COMPREHENSIVE METABOLIC PANEL - Abnormal; Notable for the following components:      Result Value   Glucose, Bld 124 (*)    Calcium 8.6 (*)    Total Bilirubin 0.2 (*)    All other components  within normal limits  ETHANOL - Abnormal; Notable for the following components:   Alcohol, Ethyl (B) 181 (*)    All other components within normal limits  CBC WITH DIFFERENTIAL/PLATELET - Abnormal; Notable for the following components:   WBC 15.7 (*)    MCV 100.7 (*)    Neutro Abs 13.9 (*)    Abs Immature Granulocytes 0.14 (*)    All other components within normal limits  CBG MONITORING, ED - Abnormal; Notable for the following components:   Glucose-Capillary 114 (*)    All other components within normal limits  RAPID URINE DRUG SCREEN, HOSP PERFORMED    EKG EKG Interpretation  Date/Time:  Monday January 21 2022 00:00:22 EDT Ventricular Rate:  94 PR Interval:  128 QRS Duration: 89 QT Interval:  368 QTC Calculation: 461 R Axis:   64 Text Interpretation: Sinus rhythm Minimal ST depression, inferior leads Confirmed by Ripley Fraise [phone]) on 01/21/2022 12:07:27 AM  Radiology DG Chest Portable 1 View  Result Date: 01/21/2022 CLINICAL DATA:  41 year old male with history of shortness of breath. Drug overdose. EXAM: PORTABLE CHEST 1 VIEW COMPARISON:  Chest x-ray 01/21/2022. FINDINGS: Lung volumes are low. Opacity in the right mid to lower lung with marked elevation of the right hemidiaphragm. Left lung is clear. Mild blunting of the right costophrenic sulcus. No definite left pleural effusion. No pneumothorax. No evidence of pulmonary edema. Heart size is borderline enlarged, likely  accentuated by low lung volumes and patient's rotation to the right which also distorts mediastinal contours. IMPRESSION: 1. Low lung volumes with extensive atelectasis and/or consolidation in the right lung base with probable small right pleural effusion. Given the patient's history, sequela of aspiration with aspiration pneumonia/pneumonitis and associated atelectasis is suspected. Electronically Signed   By: Vinnie Langton M.D.   On: 01/21/2022 05:55   DG Chest Portable 1 View  Result Date:  01/21/2022 CLINICAL DATA:  Shortness of breath.  Possible drug overdose. EXAM: PORTABLE CHEST 1 VIEW COMPARISON:  None Available. FINDINGS: There are multiple overlying monitor wires. The lungs are expiratory. The heart is upper-normal for size given the degree of inspiration. The mediastinum is normally outlined. The hypoexpanded lungs appear generally clear with limited view of the bases. No pleural effusion is seen. Central vessels are normal caliber. Thoracic cage grossly intact. IMPRESSION: Limited expiratory study. No acute process is seen as far as visualized, with limited view of the hypoinflated lung bases. The cardiac size upper limit of normal with no findings of CHF. Electronically Signed   By: Telford Nab M.D.   On: 01/21/2022 00:48    Procedures .Critical Care  Performed by: Ripley Fraise, MD Authorized by: Ripley Fraise, MD   Critical care provider statement:    Critical care time (minutes):  45   Critical care start time:  01/21/2022 5:45 AM   Critical care end time:  01/21/2022 6:30 AM   Critical care time was exclusive of:  Separately billable procedures and treating other patients   Critical care was necessary to treat or prevent imminent or life-threatening deterioration of the following conditions:  Respiratory failure and toxidrome   Critical care was time spent personally by me on the following activities:  Development of treatment plan with patient or surrogate, examination of patient, evaluation of patient's response to treatment, pulse oximetry, ordering and review of radiographic studies, ordering and review of laboratory studies, re-evaluation of patient's condition, ordering and performing treatments and interventions and review of old charts   I assumed direction of critical care for this patient from another provider in my specialty: no     Care discussed with: admitting provider       Medications Ordered in ED Medications  cefTRIAXone (ROCEPHIN) 1 g in  sodium chloride 0.9 % 100 mL IVPB (1 g Intravenous New Bag/Given 01/21/22 0629)  azithromycin (ZITHROMAX) tablet 500 mg (has no administration in time range)  lactated ringers bolus 1,000 mL (0 mLs Intravenous Stopped 01/21/22 0455)  ondansetron (ZOFRAN) injection 4 mg (4 mg Intravenous Given 01/21/22 0544)  lactated ringers bolus 1,000 mL (0 mLs Intravenous Stopped 01/21/22 [id number])    ED Course/ Medical Decision Making/ A&P Clinical Course as of 01/21/22 0639  Mon Jan 21, 2022  0104 Glucose-Capillary(!): 114 Mild hyperglycemia [DW]  0153 Alcohol, Ethyl (B)(!): 181 Alcohol intoxication [DW]  0153 WBC(!): 15.7 Leukocytosis [DW]  0440 Patient is now more alert.  He reports he was with other people he was drinking alcohol out of a cup and then he passed out.  He denies any opiate use. [DW]  [phone] Patient failed ambulation trial, and also had vomiting.  Patient also has a new oxygen requirement.  Repeat chest x-ray shows possible effusion/infiltrate which could be due aspiration due to recent overdose. [DW]  [phone] Patient will need to be admitted for further monitoring.  We will start IV antibiotics.  Discussed with Dr. Bridgett Larsson for admission.  He request CT chest noncontrast [DW]  Clinical Course User Index [DW] Ripley Fraise, MD                           Medical Decision Making Amount and/or Complexity of Data Reviewed Labs: ordered. Decision-making details documented in ED Course. Radiology: ordered.  Risk Prescription drug management. Decision regarding hospitalization.   This patient presents to the ED for concern of overdose and altered mental status, this involves an extensive number of treatment options, and is a complaint that carries with it a high risk of complications and morbidity.  The differential diagnosis includes but is not limited to narcotic overdose, alcohol intoxication, intracranial hemorrhage, polysubstance abuse  Comorbidities that complicate the patient  evaluation: Patient's presentation is complicated by their history of substance use disorder  Social Determinants of Health: Patient's  history of substance use disorder   increases the complexity of managing their presentation  Additional history obtained: Nursing staff obtained history from EMS  Lab Tests: I Ordered, and personally interpreted labs.  The pertinent results include: Hyperglycemia, alcohol intoxication  Imaging Studies ordered: I ordered imaging studies including X-ray chest   I independently visualized and interpreted imaging which showed limited views, hypoinflated lungs I agree with the radiologist interpretation  Cardiac Monitoring: The patient was maintained on a cardiac monitor.  I personally viewed and interpreted the cardiac monitor which showed an underlying rhythm of:  sinus rhythm  Medicines ordered and prescription drug management: I ordered medication including IV fluids for dehydration Reevaluation of the patient after these medicines showed that the patient    stayed the same  Critical Interventions:  IV fluids and antibiotics  Consultations Obtained: I requested consultation with the admitting physician Dr. Bridgett Larsson , and discussed  findings as well as pertinent plan - they recommend: Admission  Reevaluation: After the interventions noted above, I reevaluated the patient and found that they have :stayed the same  Complexity of problems addressed: Patient's presentation is most consistent with  acute presentation with potential threat to life or bodily function  Disposition: After consideration of the diagnostic results and the patient's response to treatment,  I feel that the patent would benefit from admission   .    6:48 AM Patient presents after being found down at a gas station and responded to Narcan.  He denied using any opiates but did drink alcohol out of a cup.  Patient had a persistent oxygen requirement and was unable to ambulate.   X-ray reveals possible aspiration pneumonia.  Low suspicion for CHF or PE at this time.  Patient will be admitted to the hospital        Final Clinical Impression(s) / ED Diagnoses Final diagnoses:  Acute respiratory failure with hypoxia ([place])  Accidental overdose, initial encounter  Alcohol abuse    Rx / DC Orders ED Discharge Orders     None         Ripley Fraise, MD 01/21/22 [phone]

## 2022-01-21 NOTE — ED Notes (Addendum)
Pt is responsive and answering questions. Md and nurse made aware.

## 2022-01-21 NOTE — H&P (Signed)
History and Physical    Patient: Eddie Smith GYF:[id number] DOB: [birth year] DOA: 01/20/2022 DOS: the patient was seen and examined on 01/21/2022 PCP: Pcp, No  Patient coming from:  Gas station  Chief Complaint:  Chief Complaint  Patient presents with   Drug Overdose   HPI: Eddie Smith is a 41 y.o. male with medical history significant of EtOH abuse, depression. Presenting with possible drug overdose. The patient reports that he was in his normal state of health til the event happened. He says he was having a beer with a friend. Then next thing he new EMS was working on him. He denies any cough, fever, chest pain, palpitations, or any other symptoms prior to passing out. EMS found him in unresponsive at a has station. They gave him 1mg  of narcan and brought him to the ED. He denies any other aggravating or alleviating factors.    Review of Systems: As mentioned in the history of present illness. All other systems reviewed and are negative.  PMHx Depression EtOH abuse  PSHx History reviewed. No pertinent surgical history.  Social History:  +EtOH, illict Rx, tobacco use  No Known Allergies  FamHx History reviewed. No pertinent family history.  Prior to Admission medications   Not on File    Physical Exam: Vitals:   01/21/22 0559 01/21/22 0630 01/21/22 0645 01/21/22 0730  BP: 128/87 123/77 (!) 139/91 125/84  Pulse: 78 89 77 78  Resp: 15 14 (!) 21 14  Temp:      TempSrc:      SpO2: 94% 92% 91% 92%   General: 41 y.o. male resting in bed in NAD Eyes: PERRL, normal sclera ENMT: Nares patent w/o discharge, orophaynx clear, dentition normal, ears w/o discharge/lesions/ulcers Neck: Supple, trachea midline Cardiovascular: RRR, +S1, S2, no m/g/r, equal pulses throughout Respiratory: CTABL, no w/r/r, normal WOB GI: BS+, NDNT, no masses noted, no organomegaly noted MSK: No e/c/c Skin: No rashes, bruises, ulcerations noted Neuro: A&O x 3, no focal  deficits Psyc: Appropriate interaction and affect, calm/cooperative  Data Reviewed:  Na+  141 K+  3.7 Glucose  124 BUN  9 Scr  0.82 WBC  15.7 Hgb  14.6  CXR:  1. Low lung volumes with extensive atelectasis and/or consolidation in the right lung base with probable small right pleural effusion. Given the patient's history, sequela of aspiration with aspiration pneumonia/pneumonitis and associated atelectasis is suspected.  CT chest w/o Multi segment atelectasis with possible superimposed pneumonia.  EKG: sinus, no st elevations  Assessment and Plan: RLL PNA     - admit to inpt, tele     - continue rocephin, azithro, fluids     - PRN nebs     - check urine legionella/strep     - IS, FV     - guaifenesin  Syncope Possible Drug OD     - check UDS     - check echo, orthostatics     - EKG as above  EtOH abuse Tobacco abuse Polysubstance abuse     - counsel against further use     - CIWA     - nicotine patch  Depression     - continue home regimen when confirmed  Advance Care Planning:   Code Status: FULL  Consults: None  Family Communication: None at bedside  Severity of Illness: The appropriate patient status for this patient is INPATIENT. Inpatient status is judged to be reasonable and necessary in order to provide the required intensity of service  to ensure the patient's safety. The patient's presenting symptoms, physical exam findings, and initial radiographic and laboratory data in the context of their chronic comorbidities is felt to place them at high risk for further clinical deterioration. Furthermore, it is not anticipated that the patient will be medically stable for discharge from the hospital within 2 midnights of admission.   * I certify that at the point of admission it is my clinical judgment that the patient will require inpatient hospital care spanning beyond 2 midnights from the point of admission due to high intensity of service, high risk for  further deterioration and high frequency of surveillance required.*  Author: Teddy Spike, DO 01/21/2022 7:44 AM  For on call review [email or web address].

## 2022-01-21 NOTE — ED Notes (Signed)
Unable to assess pt for SI due to pt confusion

## 2022-01-21 NOTE — ED Notes (Signed)
Pt trialed of of O2. Dropped to 84%. Placed back on 2L [place].

## 2022-01-22 ENCOUNTER — Encounter (HOSPITAL_COMMUNITY): Payer: Self-pay | Admitting: Internal Medicine

## 2022-01-22 ENCOUNTER — Inpatient Hospital Stay (HOSPITAL_COMMUNITY): Payer: Medicaid Other

## 2022-01-22 DIAGNOSIS — R55 Syncope and collapse: Secondary | ICD-10-CM

## 2022-01-22 DIAGNOSIS — J189 Pneumonia, unspecified organism: Secondary | ICD-10-CM | POA: Diagnosis not present

## 2022-01-22 LAB — ECHOCARDIOGRAM COMPLETE
AR max vel: 3.25 cm2
AV Area VTI: 3.36 cm2
AV Area mean vel: 2.97 cm2
AV Mean grad: 4 mmHg
AV Peak grad: 6.9 mmHg
Ao pk vel: 1.31 m/s
Area-P 1/2: 3.74 cm2
Height: 61 in
S' Lateral: 3 cm
Weight: 2224 oz

## 2022-01-22 LAB — COMPREHENSIVE METABOLIC PANEL
ALT: 15 U/L (ref 0–44)
AST: 20 U/L (ref 15–41)
Albumin: 3.2 g/dL — ABNORMAL LOW (ref 3.5–5.0)
Alkaline Phosphatase: 54 U/L (ref 38–126)
Anion gap: 6 (ref 5–15)
BUN: 7 mg/dL (ref 6–20)
CO2: 31 mmol/L (ref 22–32)
Calcium: 8.9 mg/dL (ref 8.9–10.3)
Chloride: 99 mmol/L (ref 98–111)
Creatinine, Ser: 0.73 mg/dL (ref 0.61–1.24)
GFR, Estimated: >60 mL/min (ref >60)
Glucose, Bld: 103 mg/dL — ABNORMAL HIGH (ref 70–99)
Potassium: 3.9 mmol/L (ref 3.5–5.1)
Sodium: 136 mmol/L (ref 135–145)
Total Bilirubin: 0.8 mg/dL (ref 0.3–1.2)
Total Protein: 6.2 g/dL — ABNORMAL LOW (ref 6.5–8.1)

## 2022-01-22 LAB — CBC
HCT: 42.3 % (ref 39.0–52.0)
Hemoglobin: 14 g/dL (ref 13.0–17.0)
MCH: 33.2 pg (ref 26.0–34.0)
MCHC: 33.1 g/dL (ref 30.0–36.0)
MCV: 100.2 fL — ABNORMAL HIGH (ref 80.0–100.0)
Platelets: 184 10*3/uL (ref 150–400)
RBC: 4.22 MIL/uL (ref 4.22–5.81)
RDW: 14.6 % (ref 11.5–15.5)
WBC: 7.1 10*3/uL (ref 4.0–10.5)
nRBC: 0 % (ref 0.0–0.2)

## 2022-01-22 LAB — STREP PNEUMONIAE URINARY ANTIGEN: Strep Pneumo Urinary Antigen: NEGATIVE

## 2022-01-22 MED ORDER — IPRATROPIUM-ALBUTEROL 0.5-2.5 (3) MG/3ML IN SOLN
3.0000 mL | Freq: Three times a day (TID) | RESPIRATORY_TRACT | Status: DC
  Administered 2022-01-22 – 2022-01-23 (×2): 3 mL via RESPIRATORY_TRACT
  Filled 2022-01-22 (×2): qty 3

## 2022-01-22 MED ORDER — IPRATROPIUM-ALBUTEROL 0.5-2.5 (3) MG/3ML IN SOLN
3.0000 mL | Freq: Four times a day (QID) | RESPIRATORY_TRACT | Status: DC
  Administered 2022-01-22 (×2): 3 mL via RESPIRATORY_TRACT
  Filled 2022-01-22 (×2): qty 3

## 2022-01-22 NOTE — Progress Notes (Signed)
PROGRESS NOTE  Eddie Smith:[id number] DOB: 1980-11-11 DOA: 01/20/2022 PCP: Pcp, No  HPI/Recap of past 24 hours: Eddie Smith is a 41 y.o. male with medical history significant of EtOH abuse, cocaine abuse, who presented to the ED with possible drug overdose and altered mental status.  He was having a beer with a friend.  Next thing he new EMS was working on him.  EMS found him in unresponsive at a gas station. They gave him 1mg  of narcan and brought him to the ED.  Work-up in the ED revealed alcohol intoxication with alcohol level of 181, UDS positive for cocaine, multifocal pneumonia on CT scan.  The patient was started on IV antibiotics empirically Rocephin and IV azithromycin.  01/22/2022: The patient was seen and examined at his bedside.  Endorses a persistent productive cough and pleuritic pain worse with taking a deep breath.   Assessment/Plan: Principal Problem:   PNA (pneumonia) Active Problems:   Syncope   Drug overdose   Alcohol abuse   Depression   Polysubstance abuse (HCC)   Tobacco abuse  Multifocal community-acquired pneumonia, POA Personally reviewed CT scan done on admission which showed multifocal infiltrates suggestive of multifocal pneumonia. Continue Rocephin and IV azithromycin started on admission. Obtain sputum culture Pulmonary toilet DuoNebs every 6 hours Mobilize as tolerated  Alcohol intoxication with concern for withdrawal Presented with alcohol level of 181 No evidence of alcohol withdrawal at the time of this visit Continue CIWA protocol Continue multivitamins, folic acid and thiamine supplement  Cocaine abuse UDS positive for cocaine  Resolved acute metabolic encephalopathy likely secondary to polysubstance abuse, alcohol intoxication He is back to his baseline mentation.   Code Status: Full code  Family Communication: None at bedside.  Disposition Plan: Likely will discharge to home after 48 hours of IV  antibiotics.   Consultants: None  Procedures: None  Antimicrobials: Rocephin IV azithromycin.  DVT prophylaxis: Subcu Lovenox daily  Status is: Inpatient The patient requires at least 2 midnights for further evaluation and treatment of present condition.    Objective: Vitals:   01/22/22 0233 01/22/22 0618 01/22/22 0859 01/22/22 1000  BP: 107/73 107/73    Pulse: 69 74    Resp: 16 20    Temp: 98.2 F (36.8 C) 98.5 F (36.9 C)    TempSrc:      SpO2: 98% 97% 95% 97%  Weight:      Height:        Intake/Output Summary (Last 24 hours) at 01/22/2022 1112 Last data filed at 01/22/2022 01/24/2022 Gross per 24 hour  Intake 480 ml  Output --  Net 480 ml   Filed Weights   01/21/22 1400 01/21/22 1402  Weight: 63 kg 63 kg    Exam:  General: 41 y.o. year-old male well developed well nourished in no acute distress.  Alert and oriented x3. Cardiovascular: Regular rate and rhythm with no rubs or gallops.  No thyromegaly or JVD noted.   Respiratory: Diffuse rales noted bilaterally.  No wheezing noted.   Abdomen: Soft nontender nondistended with normal bowel sounds x4 quadrants. Musculoskeletal: No lower extremity edema. 2/4 pulses in all 4 extremities. Skin: No ulcerative lesions noted or rashes, Psychiatry: Mood is appropriate for condition and setting   Data Reviewed: CBC: Recent Labs  Lab 01/21/22 0057 01/21/22 1235 01/22/22 0453  WBC 15.7* 8.9 7.1  NEUTROABS 13.9*  --   --   HGB 14.6 13.9 14.0  HCT 43.6 42.2 42.3  MCV 100.7* 100.2* 100.2*  PLT 207 188 184   Basic Metabolic Panel: Recent Labs  Lab 01/21/22 0057 01/21/22 1235 01/22/22 0453  NA 141  --  136  K 3.7  --  3.9  CL 104  --  99  CO2 26  --  31  GLUCOSE 124*  --  103*  BUN 9  --  7  CREATININE 0.82 0.70 0.73  CALCIUM 8.6*  --  8.9   GFR: Estimated Creatinine Clearance: 98.3 mL/min (by C-G formula based on SCr of 0.73 mg/dL). Liver Function Tests: Recent Labs  Lab 01/21/22 0057 01/22/22 0453   AST 32 20  ALT 18 15  ALKPHOS 59 54  BILITOT 0.2* 0.8  PROT 7.5 6.2*  ALBUMIN 4.2 3.2*   No results for input(s): "LIPASE", "AMYLASE" in the last 168 hours. No results for input(s): "AMMONIA" in the last 168 hours. Coagulation Profile: No results for input(s): "INR", "PROTIME" in the last 168 hours. Cardiac Enzymes: No results for input(s): "CKTOTAL", "CKMB", "CKMBINDEX", "TROPONINI" in the last 168 hours. BNP (last 3 results) No results for input(s): "PROBNP" in the last 8760 hours. HbA1C: No results for input(s): "HGBA1C" in the last 72 hours. CBG: Recent Labs  Lab 01/21/22 0049  GLUCAP 114*   Lipid Profile: No results for input(s): "CHOL", "HDL", "LDLCALC", "TRIG", "CHOLHDL", "LDLDIRECT" in the last 72 hours. Thyroid Function Tests: No results for input(s): "TSH", "T4TOTAL", "FREET4", "T3FREE", "THYROIDAB" in the last 72 hours. Anemia Panel: No results for input(s): "VITAMINB12", "FOLATE", "FERRITIN", "TIBC", "IRON", "RETICCTPCT" in the last 72 hours. Urine analysis: No results found for: "COLORURINE", "APPEARANCEUR", "LABSPEC", "PHURINE", "GLUCOSEU", "HGBUR", "BILIRUBINUR", "KETONESUR", "PROTEINUR", "UROBILINOGEN", "NITRITE", "LEUKOCYTESUR" Sepsis Labs: @LABRCNTIP (procalcitonin:4,lacticidven:4)  )No results found for this or any previous visit (from the past 240 hour(s)).    Studies: No results found.  Scheduled Meds:  enoxaparin (LOVENOX) injection  40 mg Subcutaneous Q24H   folic acid  1 mg Oral Daily   guaiFENesin  600 mg Oral BID   ipratropium-albuterol  3 mL Nebulization Q6H   multivitamin with minerals  1 tablet Oral Daily   nicotine  14 mg Transdermal Daily   pneumococcal 20-valent conjugate vaccine  0.5 mL Intramuscular Tomorrow-1000   thiamine  100 mg Oral Daily   Or   thiamine  100 mg Intravenous Daily    Continuous Infusions:  azithromycin 500 mg (01/22/22 0807)   cefTRIAXone (ROCEPHIN)  IV 2 g (01/22/22 0557)     LOS: 1 day     01/24/22, MD [facility] Pager [phone]  If 7PM-7AM, please contact night-coverage [email or web address] Password [facility] 01/22/2022, 11:12 AM

## 2022-01-22 NOTE — TOC Initial Note (Signed)
Transition of Care [facility]) - Initial/Assessment Note    Patient Details  Name: Eddie Smith MRN: [id number] Date of Birth: Nov 19, 1980  [facility] CM/SW Contact:    Vassie Moselle, LCSW Phone Number: 01/22/2022, 10:42 AM  Clinical Narrative:                 [facility] consulted for SA education/resources. CSW met with pt to discuss substance use. Pt states he struggles with depression and he often uses alcohol and cocaine as a way to cope with his mental health. Pt states he uses alcohol more often than cocaine and does not use these at the same time. He states he drinks 3-4 beers a day unless using cocaine in which he does not drink alcohol on those days. Pt is interested in outpatient substance use counseling as he is trying to work and is unable to commit to residential treatment. Pt is also interested in resources for employment. CSW has added SA resources to pt's discharge paperwork and has provided resources to pt in person for employment resources.   Pt also requested that his mother, Eddie Smith ([phone]) be added to his contact list.   Expected Discharge Plan: Home/Self Care Barriers to Discharge: Continued Medical Work up   Patient Goals and CMS Choice Patient states their goals for this hospitalization and ongoing recovery are:: Return home      Expected Discharge Plan and Services Expected Discharge Plan: Home/Self Care In-house Referral: Clinical Social Work Discharge Planning Services: CM Consult Post Acute Care Choice: NA Living arrangements for the past 2 months: Single Family Home                 DME Arranged: N/A                    Prior Living Arrangements/Services Living arrangements for the past 2 months: Single Family Home Lives with:: Spouse Patient language and need for interpreter reviewed:: Yes Do you feel safe going back to the place where you live?: Yes      Need for Family Participation in Patient Care: No  (Comment) Care giver support system in place?: No (comment)   Criminal Activity/Legal Involvement Pertinent to Current Situation/Hospitalization: No - Comment as needed  Activities of Daily Living Home Assistive Devices/Equipment: None ADL Screening (condition at time of admission) Patient's cognitive ability adequate to safely complete daily activities?: Yes Is the patient deaf or have difficulty hearing?: No Does the patient have difficulty seeing, even when wearing glasses/contacts?: No Does the patient have difficulty concentrating, remembering, or making decisions?: No Patient able to express need for assistance with ADLs?: Yes Does the patient have difficulty dressing or bathing?: No Independently performs ADLs?: Yes (appropriate for developmental age) Does the patient have difficulty walking or climbing stairs?: No Weakness of Legs: None Weakness of Arms/Hands: None  Permission Sought/Granted                  Emotional Assessment Appearance:: Appears stated age Attitude/Demeanor/Rapport: Engaged, Gracious Affect (typically observed): Accepting Orientation: : Oriented to Self, Oriented to Place, Oriented to  Time, Oriented to Situation Alcohol / Substance Use: Illicit Drugs, Alcohol Use Psych Involvement: No (comment)  Admission diagnosis:  Alcohol abuse [F10.10] Acute respiratory failure with hypoxia (HCC) [J96.01] PNA (pneumonia) [J18.9] Accidental overdose, initial encounter [T50.901A] Patient Active Problem List   Diagnosis Date Noted   PNA (pneumonia) 01/21/2022   Syncope 01/21/2022   Drug overdose 01/21/2022   Alcohol abuse 01/21/2022  Depression 01/21/2022   Polysubstance abuse ([place]) 01/21/2022   Tobacco abuse 01/21/2022   PCP:  Pcp, No Pharmacy:  No Pharmacies Listed    Social Determinants of Health (SDOH) Interventions    Readmission Risk Interventions     No data to display

## 2022-01-22 NOTE — Plan of Care (Signed)
Pt weaned to 1.5L O2 this shift. Problem: Activity: Goal: Ability to tolerate increased activity will improve Outcome: Progressing   Problem: Clinical Measurements: Goal: Ability to maintain a body temperature in the normal range will improve Outcome: Progressing   Problem: Respiratory: Goal: Ability to maintain adequate ventilation will improve Outcome: Progressing Goal: Ability to maintain a clear airway will improve Outcome: Progressing   Problem: Education: Goal: Knowledge of General Education information will improve Description: Including pain rating scale, medication(s)/side effects and non-pharmacologic comfort measures Outcome: Progressing   Problem: Health Behavior/Discharge Planning: Goal: Ability to manage health-related needs will improve Outcome: Progressing   Problem: Clinical Measurements: Goal: Ability to maintain clinical measurements within normal limits will improve Outcome: Progressing Goal: Will remain free from infection Outcome: Progressing Goal: Diagnostic test results will improve Outcome: Progressing Goal: Respiratory complications will improve Outcome: Progressing Goal: Cardiovascular complication will be avoided Outcome: Progressing   Problem: Activity: Goal: Risk for activity intolerance will decrease Outcome: Progressing   Problem: Nutrition: Goal: Adequate nutrition will be maintained Outcome: Progressing   Problem: Coping: Goal: Level of anxiety will decrease Outcome: Progressing   Problem: Elimination: Goal: Will not experience complications related to bowel motility Outcome: Progressing Goal: Will not experience complications related to urinary retention Outcome: Progressing   Problem: Pain Managment: Goal: General experience of comfort will improve Outcome: Progressing   Problem: Safety: Goal: Ability to remain free from injury will improve Outcome: Progressing   Problem: Skin Integrity: Goal: Risk for impaired skin  integrity will decrease Outcome: Progressing

## 2022-01-23 ENCOUNTER — Other Ambulatory Visit (HOSPITAL_COMMUNITY): Payer: Self-pay

## 2022-01-23 ENCOUNTER — Encounter (HOSPITAL_COMMUNITY): Payer: Self-pay | Admitting: Emergency Medicine

## 2022-01-23 DIAGNOSIS — G9340 Encephalopathy, unspecified: Secondary | ICD-10-CM

## 2022-01-23 DIAGNOSIS — J189 Pneumonia, unspecified organism: Secondary | ICD-10-CM | POA: Diagnosis not present

## 2022-01-23 MED ORDER — ALBUTEROL SULFATE HFA 108 (90 BASE) MCG/ACT IN AERS
2.0000 | INHALATION_SPRAY | Freq: Four times a day (QID) | RESPIRATORY_TRACT | 2 refills | Status: DC | PRN
  Filled 2022-01-23: qty 8.5, 25d supply, fill #0

## 2022-01-23 MED ORDER — THIAMINE HCL 100 MG PO TABS
100.0000 mg | ORAL_TABLET | Freq: Every day | ORAL | 0 refills | Status: AC
  Filled 2022-01-23: qty 30, 30d supply, fill #0

## 2022-01-23 MED ORDER — LIP MEDEX EX OINT
TOPICAL_OINTMENT | CUTANEOUS | Status: DC | PRN
  Administered 2022-01-23: 75 via TOPICAL
  Filled 2022-01-23: qty 7

## 2022-01-23 MED ORDER — AMOXICILLIN-POT CLAVULANATE 875-125 MG PO TABS
1.0000 | ORAL_TABLET | Freq: Two times a day (BID) | ORAL | 0 refills | Status: AC
  Filled 2022-01-23: qty 10, 5d supply, fill #0

## 2022-01-23 MED ORDER — AZITHROMYCIN 250 MG PO TABS: 500.0000 mg | ORAL_TABLET | Freq: Every day | ORAL | Status: DC

## 2022-01-23 NOTE — Discharge Summary (Signed)
Physician Discharge Summary  Eddie Smith [place] VQQ:[id number] DOB: [birth year] DOA: 01/20/2022  PCP: Pcp, No  Admit date: 01/20/2022 Discharge date: 01/23/2022 Recommendations for Outpatient Follow-up:  Follow up with PCP in 1 weeks-call for appointment Please obtain BMP/CBC in one week  Discharge Dispo: home Discharge Condition: Stable Code Status:   Code Status: Full Code Diet recommendation:  Diet Order             Diet Heart Room service appropriate? Yes; Fluid consistency: Thin  Diet effective now                    Brief/Interim Summary: 41 y.o. male W/ EtOH abuse, cocaine abuse, presented to the ED with possible drug overdose and altered mental status.  He was having a beer with a friend.  Next thing he new EMS was working on him.  EMS found him in unresponsive at a gas station. They gave him  of narcan and brought him to the ED.  Work-up in the ED revealed alcohol intoxication with alcohol level of 181, UDS positive for cocaine, multifocal pneumonia on CT scan,started on IV antibiotics empirically Rocephin and IV azithromycin and admitted for further management. 6/13-complaining of productive cough.  Pleuritic chest pain 6/14-overnight afebrile BP soft, on room air, labs showed a stable WBC count At this time mental status improved stable no fever no leukocytosis doing well on room air tolerating diet ambulating well.  He feels ready for discharge home today advised to follow-up with PCP discussed about drug cessation.   Discharge Diagnoses:  Principal Problem:   PNA (pneumonia) Active Problems:   Drug overdose   Alcohol abuse   Polysubstance abuse (HCC)   Tobacco abuse   Acute encephalopathy  Multifocal PNA, community-acquired: CT scan w/ Multifocal infiltrates treated with IV antibiotics,Currently afebrile overall doing well No blood culture on admission.  Remains afebrile on room air, will discharge him on oral Augmentin to complete the course  Drug  overdose-needing Narcan by EMS Polysubstance abuse with cocaine positive, alcohol intoxication.  TOC consult to help with resources.  Discussed extensively about cessation wife at the bedside.  Alcohol abuse and intoxication, concern for withdrawal alcohol level 181 on admission on CIWA protocol thiamine folate multivitamins.  Current no signs of withdrawal doing well ambulating well  Acute toxic encephalopathy in the setting of substance abuse alcohol intoxication resolved Tobacco abuse-nicotine patch    Consults: Toc  Subjective: Seen and examined this morning on room air doing well no complaints, feels ready for home  Discharge Exam: Vitals:   01/23/22 0930 01/23/22 1009  BP: 103/66   Pulse: 77   Resp:    Temp: 98.7 F (37.1 C)   SpO2: 97% 96%   General: Pt is alert, awake, not in acute distress Cardiovascular: RRR, S1/S2 +, no rubs, no gallops Respiratory: CTA bilaterally, no wheezing, no rhonchi Abdominal: Soft, NT, ND, bowel sounds + Extremities: no edema, no cyanosis  Discharge Instructions  Discharge Instructions     Discharge instructions   Complete by: As directed    Please call call MD or return to ER for similar or worsening recurring problem that brought you to hospital or if any fever,nausea/vomiting,abdominal pain, uncontrolled pain, chest pain,  shortness of breath or any other alarming symptoms.  Please follow-up your doctor as instructed in a week time and call the office for appointment.  Please avoid alcohol, smoking, or any other illicit substance and maintain healthy habits including taking your regular medications as prescribed.  You were cared for by a hospitalist during your hospital stay. If you have any questions about your discharge medications or the care you received while you were in the hospital after you are discharged, you can call the unit and ask to speak with the hospitalist on call if the hospitalist that took care of you is not  available.  Once you are discharged, your primary care physician will handle any further medical issues. Please note that NO REFILLS for any discharge medications will be authorized once you are discharged, as it is imperative that you return to your primary care physician (or establish a relationship with a primary care physician if you do not have one) for your aftercare needs so that they can reassess your need for medications and monitor your lab values   Increase activity slowly   Complete by: As directed       Allergies as of 01/23/2022   No Known Allergies      Medication List     TAKE these medications    albuterol 108 (90 Base) MCG/ACT inhaler Commonly known as: VENTOLIN HFA Inhale 2 puffs into the lungs every 6 (six) hours as needed for wheezing or shortness of breath.   amoxicillin-clavulanate 875-125 MG tablet Commonly known as: AUGMENTIN Take 1 tablet by mouth 2 (two) times daily for 5 days.   thiamine 100 MG tablet Take 1 tablet (100 mg total) by mouth daily. Start taking on: January 24, 2022        Follow-up Information     [facility] Follow up in 1 week(s).   Contact information: [address] [place] [address] [phone]               No Known Allergies  The results of significant diagnostics from this hospitalization (including imaging, microbiology, ancillary and laboratory) are listed below for reference.    Microbiology: No results found for this or any previous visit (from the past 240 hour(s)).  Procedures/Studies: ECHOCARDIOGRAM COMPLETE  Result Date: 01/22/2022    ECHOCARDIOGRAM REPORT   Patient Name:   Eddie Smith Date of Exam: 01/22/2022 Medical Rec #:  [id number]             Height:       61.0 in Accession #:    [id number]            Weight:       139.0 lb Date of Birth:  July 07, 1981            BSA:          1.618 m Patient Age:    40 years              BP:            115/77 mmHg Patient Gender: M                     HR:           77 bpm. Exam Location:  Inpatient Procedure: 2D Echo, Color Doppler and Cardiac Doppler Indications:    Syncope  History:        Patient has no prior history of Echocardiogram examinations. Hx                 of polysubstance abuse.  Sonographer:    Rodrigo Ran RCS Referring Phys: [id number] Teddy Spike IMPRESSIONS  1. Left ventricular ejection fraction, by estimation, is  60 to 65%. The left ventricle has normal function. The left ventricle has no regional wall motion abnormalities. Left ventricular diastolic parameters were normal.  2. Right ventricular systolic function is normal. The right ventricular size is mildly enlarged. There is mildly elevated pulmonary artery systolic pressure. The estimated right ventricular systolic pressure is 36.1 mmHg.  3. The mitral valve is normal in structure. No evidence of mitral valve regurgitation. No evidence of mitral stenosis.  4. The aortic valve is tricuspid. Aortic valve regurgitation is not visualized. No aortic stenosis is present.  5. The inferior vena cava is dilated in size with >50% respiratory variability, suggesting right atrial pressure of 8 mmHg. FINDINGS  Left Ventricle: Left ventricular ejection fraction, by estimation, is 60 to 65%. The left ventricle has normal function. The left ventricle has no regional wall motion abnormalities. The left ventricular internal cavity size was normal in size. There is  no left ventricular hypertrophy. Left ventricular diastolic parameters were normal. Right Ventricle: The right ventricular size is mildly enlarged. No increase in right ventricular wall thickness. Right ventricular systolic function is normal. There is mildly elevated pulmonary artery systolic pressure. The tricuspid regurgitant velocity is 2.65 m/s, and with an assumed right atrial pressure of 8 mmHg, the estimated right ventricular systolic pressure is 36.1 mmHg. Left Atrium: Left  atrial size was normal in size. Right Atrium: Right atrial size was normal in size. Pericardium: There is no evidence of pericardial effusion. Mitral Valve: The mitral valve is normal in structure. No evidence of mitral valve regurgitation. No evidence of mitral valve stenosis. Tricuspid Valve: The tricuspid valve is normal in structure. Tricuspid valve regurgitation is trivial. Aortic Valve: The aortic valve is tricuspid. Aortic valve regurgitation is not visualized. No aortic stenosis is present. Aortic valve mean gradient measures 4.0 mmHg. Aortic valve peak gradient measures 6.9 mmHg. Aortic valve area, by VTI measures 3.36 cm. Pulmonic Valve: The pulmonic valve was not well visualized. Pulmonic valve regurgitation is not visualized. Aorta: The aortic root and ascending aorta are structurally normal, with no evidence of dilitation. Venous: The inferior vena cava is dilated in size with greater than 50% respiratory variability, suggesting right atrial pressure of 8 mmHg. IAS/Shunts: The interatrial septum was not well visualized.  LEFT VENTRICLE PLAX 2D LVIDd:         4.40 cm   Diastology LVIDs:         3.00 cm   LV e' medial:    10.70 cm/s LV PW:         0.80 cm   LV E/e' medial:  6.4 LV IVS:        0.90 cm   LV e' lateral:   11.70 cm/s LVOT diam:     2.10 cm   LV E/e' lateral: 5.9 LV SV:         69 LV SV Index:   43 LVOT Area:     3.46 cm  RIGHT VENTRICLE             IVC RV Basal diam:  3.40 cm     IVC diam: 2.10 cm RV Mid diam:    2.60 cm RV S prime:     17.30 cm/s TAPSE (M-mode): 2.0 cm LEFT ATRIUM           Index        RIGHT ATRIUM           Index LA diam:      3.90 cm 2.41 cm/m  RA Area:     14.80 cm LA Vol (A2C): 34.7 ml 21.44 ml/m  RA Volume:   39.00 ml  24.10 ml/m LA Vol (A4C): 37.2 ml 22.99 ml/m  AORTIC VALVE                    PULMONIC VALVE AV Area (Vmax):    3.25 cm     PV Vmax:       1.13 m/s AV Area (Vmean):   2.97 cm     PV Peak grad:  5.1 mmHg AV Area (VTI):     3.36 cm AV Vmax:            131.00 cm/s AV Vmean:          91.200 cm/s AV VTI:            0.206 m AV Peak Grad:      6.9 mmHg AV Mean Grad:      4.0 mmHg LVOT Vmax:         123.00 cm/s LVOT Vmean:        78.100 cm/s LVOT VTI:          0.200 m LVOT/AV VTI ratio: 0.97  AORTA Ao Root diam: 3.20 cm Ao Asc diam:  2.50 cm MITRAL VALVE               TRICUSPID VALVE MV Area (PHT): 3.74 cm    TR Peak grad:   28.1 mmHg MV Decel Time: 203 msec    TR Vmax:        265.00 cm/s MV E velocity: 69.00 cm/s MV A velocity: 88.30 cm/s  SHUNTS MV E/A ratio:  0.78        Systemic VTI:  0.20 m                            Systemic Diam: 2.10 cm Epifanio Lesches MD Electronically signed by Epifanio Lesches MD Signature Date/Time: 01/22/2022/6:01:00 PM    Final    CT Chest Wo Contrast  Result Date: 01/21/2022 CLINICAL DATA:  Pneumonia, complication suspected EXAM: CT CHEST WITHOUT CONTRAST TECHNIQUE: Multidetector CT imaging of the chest was performed following the standard protocol without IV contrast. RADIATION DOSE REDUCTION: This exam was performed according to the departmental dose-optimization program which includes automated exposure control, adjustment of the mA and/or kV according to patient size and/or use of iterative reconstruction technique. COMPARISON:  Radiography from earlier today FINDINGS: Cardiovascular: No significant vascular findings. Normal heart size. No pericardial effusion. Mediastinum/Nodes: Negative for adenopathy or mass. Small volume residual thymus without thickening or nodularity Lungs/Pleura: Bandlike opacities with volume loss in the lower lobes and right middle lobe. There may be superimposed consolidation. No edema, effusion, or pneumothorax. Upper Abdomen: No acute finding Musculoskeletal: No acute finding IMPRESSION: Multi segment atelectasis with possible superimposed pneumonia. Electronically Signed   By: Tiburcio Pea M.D.   On: 01/21/2022 07:14   DG Chest Portable 1 View  Result Date: 01/21/2022 CLINICAL  DATA:  41 year old male with history of shortness of breath. Drug overdose. EXAM: PORTABLE CHEST 1 VIEW COMPARISON:  Chest x-ray 01/21/2022. FINDINGS: Lung volumes are low. Opacity in the right mid to lower lung with marked elevation of the right hemidiaphragm. Left lung is clear. Mild blunting of the right costophrenic sulcus. No definite left pleural effusion. No pneumothorax. No evidence of pulmonary edema. Heart size is borderline enlarged, likely accentuated by low lung volumes and patient's rotation  to the right which also distorts mediastinal contours. IMPRESSION: 1. Low lung volumes with extensive atelectasis and/or consolidation in the right lung base with probable small right pleural effusion. Given the patient's history, sequela of aspiration with aspiration pneumonia/pneumonitis and associated atelectasis is suspected. Electronically Signed   By: Trudie Reed M.D.   On: 01/21/2022 05:55   DG Chest Portable 1 View  Result Date: 01/21/2022 CLINICAL DATA:  Shortness of breath.  Possible drug overdose. EXAM: PORTABLE CHEST 1 VIEW COMPARISON:  None Available. FINDINGS: There are multiple overlying monitor wires. The lungs are expiratory. The heart is upper-normal for size given the degree of inspiration. The mediastinum is normally outlined. The hypoexpanded lungs appear generally clear with limited view of the bases. No pleural effusion is seen. Central vessels are normal caliber. Thoracic cage grossly intact. IMPRESSION: Limited expiratory study. No acute process is seen as far as visualized, with limited view of the hypoinflated lung bases. The cardiac size upper limit of normal with no findings of CHF. Electronically Signed   By: Almira Bar M.D.   On: 01/21/2022 00:48    Labs: BNP (last 3 results) No results for input(s): "BNP" in the last 8760 hours. Basic Metabolic Panel: Recent Labs  Lab 01/21/22 0057 01/21/22 1235 01/22/22 0453  NA 141  --  136  K 3.7  --  3.9  CL 104  --  99   CO2 26  --  31  GLUCOSE 124*  --  103*  BUN 9  --  7  CREATININE 0.82 0.70 0.73  CALCIUM 8.6*  --  8.9   Liver Function Tests: Recent Labs  Lab 01/21/22 0057 01/22/22 0453  AST 32 20  ALT 18 15  ALKPHOS 59 54  BILITOT 0.2* 0.8  PROT 7.5 6.2*  ALBUMIN 4.2 3.2*   No results for input(s): "LIPASE", "AMYLASE" in the last 168 hours. No results for input(s): "AMMONIA" in the last 168 hours. CBC: Recent Labs  Lab 01/21/22 0057 01/21/22 1235 01/22/22 0453  WBC 15.7* 8.9 7.1  NEUTROABS 13.9*  --   --   HGB 14.6 13.9 14.0  HCT 43.6 42.2 42.3  MCV 100.7* 100.2* 100.2*  PLT 207 188 184   Cardiac Enzymes: No results for input(s): "CKTOTAL", "CKMB", "CKMBINDEX", "TROPONINI" in the last 168 hours. BNP: Invalid input(s): "POCBNP" CBG: Recent Labs  Lab 01/21/22 0049  GLUCAP 114*   D-Dimer No results for input(s): "DDIMER" in the last 72 hours. Hgb A1c No results for input(s): "HGBA1C" in the last 72 hours. Lipid Profile No results for input(s): "CHOL", "HDL", "LDLCALC", "TRIG", "CHOLHDL", "LDLDIRECT" in the last 72 hours. Thyroid function studies No results for input(s): "TSH", "T4TOTAL", "T3FREE", "THYROIDAB" in the last 72 hours.  Invalid input(s): "FREET3" Anemia work up No results for input(s): "VITAMINB12", "FOLATE", "FERRITIN", "TIBC", "IRON", "RETICCTPCT" in the last 72 hours. Urinalysis No results found for: "COLORURINE", "APPEARANCEUR", "LABSPEC", "PHURINE", "GLUCOSEU", "HGBUR", "BILIRUBINUR", "KETONESUR", "PROTEINUR", "UROBILINOGEN", "NITRITE", "LEUKOCYTESUR" Sepsis Labs Recent Labs  Lab 01/21/22 0057 01/21/22 1235 01/22/22 0453  WBC 15.7* 8.9 7.1   Microbiology No results found for this or any previous visit (from the past 240 hour(s)).   Time coordinating discharge: 25 minutes  SIGNED: Lanae Boast, MD  [facility] 01/23/2022, 2:41 PM  If 7PM-7AM, please contact night-coverage [email or web address]

## 2022-01-23 NOTE — Hospital Course (Addendum)
41 y.o. male W/ EtOH abuse, cocaine abuse, presented to the ED with possible drug overdose and altered mental status.  He was having a beer with a friend.  Next thing he new EMS was working on him.  EMS found him in unresponsive at a gas station. They gave him 1mg  of narcan and brought him to the ED.  Work-up in the ED revealed alcohol intoxication with alcohol level of 181, UDS positive for cocaine, multifocal pneumonia on CT scan,started on IV antibiotics empirically Rocephin and IV azithromycin and admitted for further management. 6/13-complaining of productive cough.  Pleuritic chest pain 6/14-overnight afebrile BP soft, on room air, labs showed a stable WBC count At this time mental status improved stable no fever no leukocytosis doing well on room air tolerating diet ambulating well.  He feels ready for discharge home today advised to follow-up with PCP discussed about drug cessation.

## 2022-01-23 NOTE — Progress Notes (Signed)
PHARMACIST - PHYSICIAN COMMUNICATION DR:   Jonathon Bellows CONCERNING: Antibiotic IV to Oral Route Change Policy  RECOMMENDATION: This patient is receiving azithromycin by the intravenous route.  Based on criteria approved by the Pharmacy and Therapeutics Committee, the antibiotic(s) is/are being converted to the equivalent oral dose form(s).   DESCRIPTION: These criteria include: Patient being treated for a respiratory tract infection, urinary tract infection, cellulitis or clostridium difficile associated diarrhea if on metronidazole The patient is not neutropenic and does not exhibit a GI malabsorption state The patient is eating (either orally or via tube) and/or has been taking other orally administered medications for a least 24 hours The patient is improving clinically and has a Tmax < 100.5  If you have questions about this conversion, please contact the Pharmacy Department  []   [phone] )  ( [phone] []   [phone] )  [facility] []   [phone] )   [facility] []   [phone] )  [facility] [x]   [phone] )  [facility]    ( [phone], PharmD, BCPS 01/23/2022 9:48 AM'

## 2022-01-24 LAB — LEGIONELLA PNEUMOPHILA SEROGP 1 UR AG: L. pneumophila Serogp 1 Ur Ag: NEGATIVE

## 2022-01-29 ENCOUNTER — Other Ambulatory Visit (HOSPITAL_COMMUNITY): Payer: Self-pay

## 2022-02-04 ENCOUNTER — Other Ambulatory Visit (HOSPITAL_COMMUNITY): Payer: Self-pay

## 2022-03-27 ENCOUNTER — Emergency Department (HOSPITAL_COMMUNITY): Payer: Medicaid Other

## 2022-03-27 ENCOUNTER — Inpatient Hospital Stay (HOSPITAL_COMMUNITY): Payer: Medicaid Other

## 2022-03-27 ENCOUNTER — Encounter (HOSPITAL_COMMUNITY): Payer: Self-pay | Admitting: Emergency Medicine

## 2022-03-27 ENCOUNTER — Other Ambulatory Visit: Payer: Self-pay

## 2022-03-27 ENCOUNTER — Inpatient Hospital Stay (HOSPITAL_COMMUNITY)
Admission: EM | Admit: 2022-03-27 | Discharge: 2022-04-02 | DRG: 917 | Disposition: A | Discharge status: Home / Self Care | Payer: Medicaid Other | Attending: Internal Medicine | Admitting: Internal Medicine

## 2022-03-27 DIAGNOSIS — Z7151 Drug abuse counseling and surveillance of drug abuser: Secondary | ICD-10-CM | POA: Diagnosis not present

## 2022-03-27 DIAGNOSIS — F17210 Nicotine dependence, cigarettes, uncomplicated: Secondary | ICD-10-CM | POA: Diagnosis present

## 2022-03-27 DIAGNOSIS — I739 Peripheral vascular disease, unspecified: Secondary | ICD-10-CM | POA: Diagnosis present

## 2022-03-27 DIAGNOSIS — R109 Unspecified abdominal pain: Secondary | ICD-10-CM | POA: Diagnosis present

## 2022-03-27 DIAGNOSIS — Z8249 Family history of ischemic heart disease and other diseases of the circulatory system: Secondary | ICD-10-CM

## 2022-03-27 DIAGNOSIS — Z825 Family history of asthma and other chronic lower respiratory diseases: Secondary | ICD-10-CM

## 2022-03-27 DIAGNOSIS — I67848 Other cerebrovascular vasospasm and vasoconstriction: Secondary | ICD-10-CM | POA: Diagnosis not present

## 2022-03-27 DIAGNOSIS — Z560 Unemployment, unspecified: Secondary | ICD-10-CM | POA: Diagnosis not present

## 2022-03-27 DIAGNOSIS — F1490 Cocaine use, unspecified, uncomplicated: Secondary | ICD-10-CM | POA: Diagnosis not present

## 2022-03-27 DIAGNOSIS — F1090 Alcohol use, unspecified, uncomplicated: Secondary | ICD-10-CM | POA: Diagnosis not present

## 2022-03-27 DIAGNOSIS — K920 Hematemesis: Secondary | ICD-10-CM | POA: Diagnosis not present

## 2022-03-27 DIAGNOSIS — R072 Precordial pain: Secondary | ICD-10-CM | POA: Diagnosis present

## 2022-03-27 DIAGNOSIS — I639 Cerebral infarction, unspecified: Secondary | ICD-10-CM | POA: Diagnosis present

## 2022-03-27 DIAGNOSIS — Z79899 Other long term (current) drug therapy: Secondary | ICD-10-CM | POA: Diagnosis not present

## 2022-03-27 DIAGNOSIS — F1010 Alcohol abuse, uncomplicated: Secondary | ICD-10-CM | POA: Diagnosis present

## 2022-03-27 DIAGNOSIS — F1910 Other psychoactive substance abuse, uncomplicated: Secondary | ICD-10-CM | POA: Diagnosis not present

## 2022-03-27 DIAGNOSIS — F32A Depression, unspecified: Secondary | ICD-10-CM | POA: Diagnosis present

## 2022-03-27 DIAGNOSIS — Z83438 Family history of other disorder of lipoprotein metabolism and other lipidemia: Secondary | ICD-10-CM

## 2022-03-27 DIAGNOSIS — R4781 Slurred speech: Secondary | ICD-10-CM | POA: Diagnosis present

## 2022-03-27 DIAGNOSIS — F1990 Other psychoactive substance use, unspecified, uncomplicated: Secondary | ICD-10-CM | POA: Diagnosis not present

## 2022-03-27 DIAGNOSIS — T405X1A Poisoning by cocaine, accidental (unintentional), initial encounter: Secondary | ICD-10-CM | POA: Diagnosis present

## 2022-03-27 DIAGNOSIS — I6389 Other cerebral infarction: Secondary | ICD-10-CM | POA: Diagnosis not present

## 2022-03-27 DIAGNOSIS — G8194 Hemiplegia, unspecified affecting left nondominant side: Secondary | ICD-10-CM | POA: Diagnosis present

## 2022-03-27 DIAGNOSIS — I10 Essential (primary) hypertension: Secondary | ICD-10-CM | POA: Diagnosis present

## 2022-03-27 DIAGNOSIS — R079 Chest pain, unspecified: Secondary | ICD-10-CM | POA: Diagnosis not present

## 2022-03-27 DIAGNOSIS — H538 Other visual disturbances: Secondary | ICD-10-CM | POA: Diagnosis present

## 2022-03-27 DIAGNOSIS — I63441 Cerebral infarction due to embolism of right cerebellar artery: Secondary | ICD-10-CM | POA: Diagnosis present

## 2022-03-27 DIAGNOSIS — R1033 Periumbilical pain: Secondary | ICD-10-CM | POA: Diagnosis not present

## 2022-03-27 DIAGNOSIS — Z8349 Family history of other endocrine, nutritional and metabolic diseases: Secondary | ICD-10-CM

## 2022-03-27 DIAGNOSIS — F14188 Cocaine abuse with other cocaine-induced disorder: Secondary | ICD-10-CM | POA: Diagnosis present

## 2022-03-27 DIAGNOSIS — R0789 Other chest pain: Secondary | ICD-10-CM | POA: Diagnosis not present

## 2022-03-27 DIAGNOSIS — Z20822 Contact with and (suspected) exposure to covid-19: Secondary | ICD-10-CM | POA: Diagnosis present

## 2022-03-27 DIAGNOSIS — R29711 NIHSS score 11: Secondary | ICD-10-CM | POA: Diagnosis present

## 2022-03-27 DIAGNOSIS — M318 Other specified necrotizing vasculopathies: Secondary | ICD-10-CM | POA: Diagnosis not present

## 2022-03-27 LAB — I-STAT CHEM 8, ED
BUN: 6 mg/dL (ref 6–20)
Calcium, Ion: 1.09 mmol/L — ABNORMAL LOW (ref 1.15–1.40)
Chloride: 95 mmol/L — ABNORMAL LOW (ref 98–111)
Creatinine, Ser: 0.6 mg/dL — ABNORMAL LOW (ref 0.61–1.24)
Glucose, Bld: 133 mg/dL — ABNORMAL HIGH (ref 70–99)
HCT: 52 % (ref 39.0–52.0)
Hemoglobin: 17.7 g/dL — ABNORMAL HIGH (ref 13.0–17.0)
Potassium: 3.4 mmol/L — ABNORMAL LOW (ref 3.5–5.1)
Sodium: 133 mmol/L — ABNORMAL LOW (ref 135–145)
TCO2: 29 mmol/L (ref 22–32)

## 2022-03-27 LAB — ECHOCARDIOGRAM COMPLETE
AR max vel: 2.99 cm2
AV Area VTI: 3.01 cm2
AV Area mean vel: 2.79 cm2
AV Mean grad: 3 mmHg
AV Peak grad: 5.5 mmHg
Ao pk vel: 1.17 m/s
Area-P 1/2: 3.65 cm2
S' Lateral: 2.9 cm
Weight: 2222.24 oz

## 2022-03-27 LAB — CBC WITH DIFFERENTIAL/PLATELET
Abs Immature Granulocytes: 0.02 10*3/uL (ref 0.00–0.07)
Basophils Absolute: 0 10*3/uL (ref 0.0–0.1)
Basophils Relative: 1 %
Eosinophils Absolute: 0.1 10*3/uL (ref 0.0–0.5)
Eosinophils Relative: 1 %
HCT: 48.6 % (ref 39.0–52.0)
Hemoglobin: 16.1 g/dL (ref 13.0–17.0)
Immature Granulocytes: 0 %
Lymphocytes Relative: 15 %
Lymphs Abs: 1.1 10*3/uL (ref 0.7–4.0)
MCH: 33 pg (ref 26.0–34.0)
MCHC: 33.1 g/dL (ref 30.0–36.0)
MCV: 99.6 fL (ref 80.0–100.0)
Monocytes Absolute: 1 10*3/uL (ref 0.1–1.0)
Monocytes Relative: 13 %
Neutro Abs: 5.2 10*3/uL (ref 1.7–7.7)
Neutrophils Relative %: 70 %
Platelets: 270 10*3/uL (ref 150–400)
RBC: 4.88 MIL/uL (ref 4.22–5.81)
RDW: 13.8 % (ref 11.5–15.5)
WBC: 7.3 10*3/uL (ref 4.0–10.5)
nRBC: 0 % (ref 0.0–0.2)

## 2022-03-27 LAB — LIPID PANEL
Cholesterol: 164 mg/dL (ref 0–200)
HDL: 85 mg/dL (ref >40)
LDL Cholesterol: 64 mg/dL (ref 0–99)
Total CHOL/HDL Ratio: 1.9 RATIO
Triglycerides: 75 mg/dL (ref <150)
VLDL: 15 mg/dL (ref 0–40)

## 2022-03-27 LAB — RAPID URINE DRUG SCREEN, HOSP PERFORMED
Amphetamines: POSITIVE — AB
Barbiturates: NOT DETECTED
Benzodiazepines: NOT DETECTED
Cocaine: POSITIVE — AB
Opiates: NOT DETECTED
Tetrahydrocannabinol: NOT DETECTED

## 2022-03-27 LAB — COMPREHENSIVE METABOLIC PANEL
ALT: 19 U/L (ref 0–44)
AST: 33 U/L (ref 15–41)
Albumin: 3.7 g/dL (ref 3.5–5.0)
Alkaline Phosphatase: 77 U/L (ref 38–126)
Anion gap: 8 (ref 5–15)
BUN: 7 mg/dL (ref 6–20)
CO2: 31 mmol/L (ref 22–32)
Calcium: 9.5 mg/dL (ref 8.9–10.3)
Chloride: 96 mmol/L — ABNORMAL LOW (ref 98–111)
Creatinine, Ser: 0.75 mg/dL (ref 0.61–1.24)
GFR, Estimated: >60 mL/min (ref >60)
Glucose, Bld: 128 mg/dL — ABNORMAL HIGH (ref 70–99)
Potassium: 3.7 mmol/L (ref 3.5–5.1)
Sodium: 135 mmol/L (ref 135–145)
Total Bilirubin: 1.2 mg/dL (ref 0.3–1.2)
Total Protein: 6.8 g/dL (ref 6.5–8.1)

## 2022-03-27 LAB — PROTIME-INR
INR: 1 (ref 0.8–1.2)
Prothrombin Time: 12.6 seconds (ref 11.4–15.2)

## 2022-03-27 LAB — HEMOGLOBIN A1C
Hgb A1c MFr Bld: 5.2 % (ref 4.8–5.6)
Mean Plasma Glucose: 102.54 mg/dL

## 2022-03-27 LAB — URINALYSIS, ROUTINE W REFLEX MICROSCOPIC
Bilirubin Urine: NEGATIVE
Glucose, UA: NEGATIVE mg/dL
Hgb urine dipstick: NEGATIVE
Ketones, ur: NEGATIVE mg/dL
Leukocytes,Ua: NEGATIVE
Nitrite: NEGATIVE
Protein, ur: NEGATIVE mg/dL
Specific Gravity, Urine: 1.035 — ABNORMAL HIGH (ref 1.005–1.030)
pH: 7 (ref 5.0–8.0)

## 2022-03-27 LAB — ETHANOL: Alcohol, Ethyl (B): <10 mg/dL (ref <10)

## 2022-03-27 LAB — TROPONIN I (HIGH SENSITIVITY)
Troponin I (High Sensitivity): 5 ng/L (ref <18)
Troponin I (High Sensitivity): 6 ng/L (ref <18)

## 2022-03-27 LAB — LIPASE, BLOOD: Lipase: 34 U/L (ref 11–51)

## 2022-03-27 LAB — SALICYLATE LEVEL: Salicylate Lvl: <7 mg/dL — ABNORMAL LOW (ref 7.0–30.0)

## 2022-03-27 LAB — ACETAMINOPHEN LEVEL: Acetaminophen (Tylenol), Serum: <10 ug/mL — ABNORMAL LOW (ref 10–30)

## 2022-03-27 LAB — APTT: aPTT: 23 seconds — ABNORMAL LOW (ref 24–36)

## 2022-03-27 LAB — RESP PANEL BY RT-PCR (FLU A&B, COVID) ARPGX2
Influenza A by PCR: NEGATIVE
Influenza B by PCR: NEGATIVE
SARS Coronavirus 2 by RT PCR: NEGATIVE

## 2022-03-27 MED ORDER — THIAMINE HCL 100 MG/ML IJ SOLN: 100.0000 mg | Freq: Every day | INTRAMUSCULAR | Status: DC

## 2022-03-27 MED ORDER — STROKE: EARLY STAGES OF RECOVERY BOOK
Freq: Once | Status: AC
  Filled 2022-03-27: qty 1

## 2022-03-27 MED ORDER — ACETAMINOPHEN 650 MG RE SUPP: 650.0000 mg | Freq: Four times a day (QID) | RECTAL | Status: DC | PRN

## 2022-03-27 MED ORDER — ACETAMINOPHEN 325 MG PO TABS
650.0000 mg | ORAL_TABLET | Freq: Four times a day (QID) | ORAL | Status: DC | PRN
  Administered 2022-03-28 – 2022-04-01 (×3): 650 mg via ORAL
  Filled 2022-03-27 (×3): qty 2

## 2022-03-27 MED ORDER — FOLIC ACID 1 MG PO TABS
1.0000 mg | ORAL_TABLET | Freq: Every day | ORAL | Status: DC
Start: 2022-03-27 — End: 2022-04-02
  Administered 2022-03-27 – 2022-04-02 (×7): 1 mg via ORAL
  Filled 2022-03-27 (×7): qty 1

## 2022-03-27 MED ORDER — IOHEXOL 350 MG/ML SOLN
50.0000 mL | Freq: Once | INTRAVENOUS | Status: AC | PRN
  Administered 2022-03-27: 50 mL via INTRAVENOUS

## 2022-03-27 MED ORDER — ENOXAPARIN SODIUM 40 MG/0.4ML IJ SOSY
40.0000 mg | PREFILLED_SYRINGE | INTRAMUSCULAR | Status: DC
  Administered 2022-03-27 – 2022-03-31 (×5): 40 mg via SUBCUTANEOUS
  Filled 2022-03-27 (×7): qty 0.4

## 2022-03-27 MED ORDER — POTASSIUM CHLORIDE CRYS ER 20 MEQ PO TBCR
40.0000 meq | EXTENDED_RELEASE_TABLET | Freq: Two times a day (BID) | ORAL | Status: AC
Start: 2022-03-27 — End: 2022-03-27
  Administered 2022-03-27 (×2): 40 meq via ORAL
  Filled 2022-03-27 (×2): qty 2

## 2022-03-27 MED ORDER — ONDANSETRON HCL 4 MG/2ML IJ SOLN: 4.0000 mg | Freq: Four times a day (QID) | INTRAMUSCULAR | Status: DC | PRN

## 2022-03-27 MED ORDER — THIAMINE HCL 100 MG PO TABS
100.0000 mg | ORAL_TABLET | Freq: Every day | ORAL | Status: DC
Start: 2022-03-27 — End: 2022-04-02
  Administered 2022-03-27 – 2022-04-02 (×7): 100 mg via ORAL
  Filled 2022-03-27 (×7): qty 1

## 2022-03-27 MED ORDER — CLOPIDOGREL BISULFATE 75 MG PO TABS
75.0000 mg | ORAL_TABLET | Freq: Every day | ORAL | Status: DC
  Administered 2022-03-27 – 2022-04-02 (×7): 75 mg via ORAL
  Filled 2022-03-27 (×7): qty 1

## 2022-03-27 MED ORDER — ONDANSETRON 4 MG PO TBDP: 4.0000 mg | ORAL_TABLET | Freq: Once | ORAL | Status: DC

## 2022-03-27 MED ORDER — ADULT MULTIVITAMIN W/MINERALS CH
1.0000 | ORAL_TABLET | Freq: Every day | ORAL | Status: DC
  Administered 2022-03-27 – 2022-04-02 (×7): 1 via ORAL
  Filled 2022-03-27 (×9): qty 1

## 2022-03-27 MED ORDER — SODIUM CHLORIDE 0.9 % IV BOLUS
500.0000 mL | Freq: Once | INTRAVENOUS | Status: AC
  Administered 2022-03-27: 500 mL via INTRAVENOUS

## 2022-03-27 MED ORDER — SODIUM CHLORIDE 0.9 % IV SOLN
100.0000 mL/h | INTRAVENOUS | Status: DC
  Administered 2022-03-27: 100 mL/h via INTRAVENOUS

## 2022-03-27 MED ORDER — ASPIRIN 81 MG PO CHEW
81.0000 mg | CHEWABLE_TABLET | Freq: Every day | ORAL | Status: DC
  Administered 2022-03-27 – 2022-04-02 (×7): 81 mg via ORAL
  Filled 2022-03-27 (×7): qty 1

## 2022-03-27 NOTE — H&P (Addendum)
Date: 03/27/2022               Patient Name:  Eddie Smith MRN: [id number]  DOB: 06/20/1981 Age / Sex: 41 y.o., male   PCP: Pcp, No         Medical Service: Internal Medicine Teaching Service         Attending Physician: Dr. Mayford Knife, Dorene Ar, MD    First Contact: Dr. Aundria Rud Pager: [phone]  Second Contact: Dr. Marijo Conception Pager: [phone]       After Hours (After 5p/  First Contact Pager: [phone]  weekends / holidays): Second Contact Pager: [phone]   Chief Concern: chest pain and left sided weakness  History of Present Illness:   Eddie Smith is a 41 year old male with past medical history of depression, and polysubstance use disorder who presents to the emergency room for chest pain and was found to have left-sided weakness and blurry vision.  Patient reports chest pain started yesterday around 5 PM.  Pain was midsternal and radiates to bilateral sides.  No radiation to the left jaw or arm.  Pain occurred when he was walking but did not improve with resting.  No other aggravating factors.  Also reports shortness of breath, diaphoresis and palpitation.  Today the pain has improved in the emergency room.  While he was in the emergency room, he was noted to have left-sided weakness, tingling and blurry vision.  Code stroke was called.  These symptoms are new for him.  He also reports slurred speech.  Patient stated that he had nausea and vomiting yesterday accompanied with abdominal pain.  Reports small streaks of blood in his emesis when first started. He is unsure of any triggering factor.  Nobody in the family has a similar symptoms.  States that his abdominal pain/nausea/vomiting has improved significantly now.  Last emesis was yesterday. Patient denies any urinary or bowel movement symptoms.  Patient was admitted in June for possible drug overdose with alcohol intoxication, and found to have multifocal pneumonia on CT scan.  He was started on IV Rocephin and  azithromycin and discharged with oral Augmentin for 4 weeks per patient.  He said that he only took 3 days of antibiotic.  In the ED, CT head was negative.  Brain MRI showed a small infarct in the right cerebellum.  Patient is admitted for CVA work-up.  Meds:  -Patient is not taking any medications.  Allergies: Allergies as of 03/27/2022   (No Known Allergies)   Past Medical History:  Diagnosis Date   Alcohol abuse    Hypertension 04/03/2012   Prostatitis     Family History:  Family History  Problem Relation Age of Onset   Hyperlipidemia Maternal Grandmother    Hyperlipidemia Maternal Grandfather    Asthma Brother    Cancer Father    Thyroid disease Mother    Osteoarthritis Mother    Hypertension Mother      Social History:  -Lives at home with his wife and children -Currently unemployed -Drink about 6 pack of beer a day.  His last drink was 2 days ago.  He denies any issue with alcohol withdrawal in the past -Smokes cigarettes 1/2 pack a day -Only used cocaine and last use was 2 days ago. -No PCP  Review of Systems: A complete ROS was negative except as per HPI.   Physical Exam: Blood pressure 118/84, pulse (!) 58, temperature 97.7 F (36.5 C), temperature source Oral, resp. rate 12, weight 63 kg, SpO2  97 %. Physical Exam Constitutional:      General: He is not in acute distress.    Appearance: He is not ill-appearing.  HENT:     Head: Normocephalic.     Mouth/Throat:     Mouth: Mucous membranes are dry.  Eyes:     General: No scleral icterus.       Right eye: No discharge.        Left eye: No discharge.  Cardiovascular:     Rate and Rhythm: Normal rate and regular rhythm.     Heart sounds: Normal heart sounds. No murmur heard. Pulmonary:     Effort: Pulmonary effort is normal. No respiratory distress.     Breath sounds: Normal breath sounds. No wheezing.  Abdominal:     General: Bowel sounds are normal. There is no distension.     Palpations: Abdomen  is soft.     Tenderness: There is no abdominal tenderness. There is no guarding.  Musculoskeletal:        General: No swelling or deformity. Normal range of motion.     Right lower leg: No edema.     Left lower leg: No edema.  Skin:    General: Skin is warm.     Coloration: Skin is not jaundiced.     Findings: No bruising.  Neurological:     Mental Status: He is alert.     Comments: PERRLA EOM intact Blurry vision on the left side Diminished sensation left side of face Other cranial nerves intact Unable to form a grip of left hand.   3/5 strength of left UE 5/5 strength of right UE 4/5 strength of left LE 5/5 strength of right LE  Psychiatric:        Mood and Affect: Mood normal.      EKG: personally reviewed my interpretation is sinus rhythm   CXR: personally reviewed my interpretation is unremarkable  Assessment & Plan by Problem: Principal Problem:   CVA (cerebral vascular accident) (HCC) Active Problems:   Depression   Polysubstance use disorder   Atypical chest pain  Eddie Smith is a 41 year old male with past medical history of depression, and polysubstance use disorder who presents to the emergency room for chest pain and found to have left-sided weakness and blurry vision, admitted for right cerebellum CVA.  Acute small right cerebellum infarct His risk factor including cocaine and tobacco use.  CTA head and neck showed a beaded appearance of multiple large vessels around the skull base which suggest vasospasm secondary to cocaine.  He has no other arterial stenosis. His symptoms are not fully explained by the small infarct in the cerebellum but the MRI view was limited. -Appreciate neurology recommendations -Pending echocardiogram, lipid panel and A1c -DAPT with aspirin and Plavix -PT/OT/SLP -Counseled patient on tobacco and cocaine use cessation  Atypical chest pain ACS ruled out with normal troponin and unremarkable EKG. His chest pain can be cocaine  induced causing vasospasm. His chest pain can also be related to his emesis causing Mallory-Wise tear.  -Pending echocardiogram -Telemetry  Alcohol use disorder Patient denied history of alcohol withdrawal -CIWA without Ativan  Hematemesis  Abdominal pain Unclear etiology but symptom has significantly improved.  Patient did not have abdominal pain on examination.  Lipase was negative, low suspicion for pancreatitis. His hematemesis could be 2/2 Mallory Wise tear. His Hgb is normal.  -Supportive care with Zofran PRN  -Monitor CBC or sign of further GI bleed  Polysubstance use disorder -Encourage on tobacco  and cocaine use cessation -Patient declined nicotine patch -TOC consult for substance use  History of multifocal pneumonia 1 month ago Chest x-ray is clear.  Patient has no evidence of infection without leukocytosis or fever.  He currently has no respiratory symptoms. -Hold off on resuming antibiotic  Depression Patient denies suicidal thoughts -He can follow-up PCP and behavioral provider if indicated after discharge  Diet: Heart healthy Full code IVF: N/A DVT: Lovenox  Dispo: Admit patient to Inpatient with expected length of stay greater than 2 midnights.  Signed: Doran Stabler, DO 03/27/2022, 10:49 AM  Pager: [phone] After 5pm on weekdays and 1pm on weekends: On Call pager: [phone]

## 2022-03-27 NOTE — ED Notes (Signed)
Attempted to notify pts wife, no answer. [phone]

## 2022-03-27 NOTE — Progress Notes (Addendum)
STROKE TEAM PROGRESS NOTE   INTERVAL HISTORY  No family at the bedside.  CP last pm with vomiting, still with CP. Left side with weakness and numbness, left eye blurry with dizziness. Still with tingling in left leg .  He admits to abusing cocaine and amphetamines. Neuro exam- he is alert and oriented x 4. He is laying comfortable on stretcher in NAD. No aphasia or dysarthria. Left arm with drift 3/5 no pronation, left leg weak 4/5, sensation decreased on left left side with numbness VSS   Discussed polysubstance abuse and cessation education Smokes cigarettes also   Vitals:   03/27/22 0830 03/27/22 0845 03/27/22 0900 03/27/22 0915  BP: 115/84 111/80 111/85 (!) 123/92  Pulse: 88 60 62 73  Resp: (!) 28 13 14 18   Temp:      TempSrc:      SpO2: 96% 96% 97% 97%  Weight:       CBC:  Recent Labs  Lab 03/27/22 0338 03/27/22 0623  WBC 7.3  --   NEUTROABS 5.2  --   HGB 16.1 17.7*  HCT 48.6 52.0  MCV 99.6  --   PLT 270  --    Basic Metabolic Panel:  Recent Labs  Lab 03/27/22 0338 03/27/22 0623  NA 135 133*  K 3.7 3.4*  CL 96* 95*  CO2 31  --   GLUCOSE 128* 133*  BUN 7 6  CREATININE 0.75 0.60*  CALCIUM 9.5  --    Lipid Panel: No results for input(s): "CHOL", "TRIG", "HDL", "CHOLHDL", "VLDL", "LDLCALC" in the last 168 hours. HgbA1c: No results for input(s): "HGBA1C" in the last 168 hours. Urine Drug Screen:  Recent Labs  Lab 03/27/22 0719  LABOPIA NONE DETECTED  COCAINSCRNUR POSITIVE*  LABBENZ NONE DETECTED  AMPHETMU POSITIVE*  THCU NONE DETECTED  LABBARB NONE DETECTED    Alcohol Level  Recent Labs  Lab 03/27/22 0616  ETH <10    IMAGING past 24 hours CT ANGIO HEAD NECK W WO CM (CODE STROKE)  Result Date: 03/27/2022 CLINICAL DATA:  41 year old male code stroke presentation with small acute right cerebellar infarct on MRI. History of cocaine use. EXAM: CT ANGIOGRAPHY HEAD AND NECK TECHNIQUE: Multidetector CT imaging of the head and neck was performed using the  standard protocol during bolus administration of intravenous contrast. Multiplanar CT image reconstructions and MIPs were obtained to evaluate the vascular anatomy. Carotid stenosis measurements (when applicable) are obtained utilizing NASCET criteria, using the distal internal carotid diameter as the denominator. RADIATION DOSE REDUCTION: This exam was performed according to the departmental dose-optimization program which includes automated exposure control, adjustment of the mA and/or kV according to patient size and/or use of iterative reconstruction technique. CONTRAST:  90mL OMNIPAQUE IOHEXOL 350 MG/ML SOLN COMPARISON:  Plain head CT and limited brain MRI this morning. FINDINGS: CTA NECK Skeleton: Carious posterior dentition. No acute osseous abnormality identified. Upper chest: Negative. Other neck: No acute neck soft tissue finding. Aortic arch: No arch atherosclerosis. Three vessel arch configuration. Right carotid system: Negative.  No atherosclerosis or stenosis. Left carotid system: Negative except for subtle beaded appearance of the left ICA below the skull base on series 5, image 77. No stenosis. Vertebral arteries: Negative proximal right subclavian artery. Non dominant right vertebral artery with venous contrast contamination about the origin and V1 segment. The right vertebral remains diminutive but patent to the skull base. No atherosclerosis or stenosis identified, however, slightly beaded appearance of the vessel in the V3 segment series 5, image 77.  Negative proximal left subclavian artery and left vertebral artery origin. Dominant left vertebral artery is patent and appears normal to the skull base. CTA HEAD Posterior circulation: Non dominant right vertebral terminates in PICA which appears to remain patent. Dominant left vertebral artery supplies the basilar, with subtle beaded appearance in the V4 segment series 6, image 260. No distal left vertebral plaque or stenosis. Patent basilar artery  without stenosis. Patent basilar tip, SCA and PCA origins. Posterior communicating arteries are diminutive or absent. Bilateral PCA branches are within normal limits. Anterior circulation: Both ICA siphons are patent, the left appears dominant owing to the ACA anatomy. Minimal siphon plaque. No stenosis identified, although beaded appearance of the supraclinoid right ICA series 6, image 193. Patent carotid termini. Patent MCA origins. Dominant left and diminutive or absent right ACA A1 segments. Anterior communicating artery and bilateral ACA branches are patent. Subtle beaded appearance of the left ACA A1 segment. Similar beaded appearance of the bilateral MCA M1 segments (series 6 images 171 and 189. Left MCA bifurcation is patent without stenosis. Right MCA trifurcation is patent without stenosis. Other bilateral MCA branches are within normal limits. Venous sinuses: Early contrast timing, but appears patent. Anatomic variants: Dominant left vertebral artery, the right terminates in PICA. Dominant left and diminutive or absent right ACA A1 segments. Review of the MIP images confirms the above findings Preliminary report discussed by telephone with Dr. Brooke Dare on 03/27/2022 at 07:04 . IMPRESSION: 1. Negative for large vessel occlusion. 2. But positive for a beaded appearance of multiple large vessels in and around the skull base. Consider cocaine induced vasospasm, Fibromuscular Dysplasia (FMD), or might be artifactual. No significant arterial stenosis. 3. Carious posterior dentition. Salient findings discussed by telephone with Dr. Brooke Dare on 03/27/2022 at 07:04 . Electronically Signed   By: Odessa Fleming M.D.   On: 03/27/2022 07:12   MR BRAIN WO CONTRAST  Result Date: 03/27/2022 CLINICAL DATA:  41 year old male code stroke presentation with left side deficits. EXAM: MRI HEAD WITHOUT CONTRAST TECHNIQUE: Multiplanar, multiecho pulse sequences of the brain and surrounding structures were obtained without  intravenous contrast. COMPARISON:  Plain head CT 0622 hours today. FINDINGS: The examination had to be discontinued prior to completion due to patient condition. No T1 weighted imaging or T2 weighted imaging obtained. DWI, SWI, and FLAIR were obtained. DWI reveals a small focus of patchy restricted diffusion in the right cerebellum (series 5, image 59), encompassing about 8 mm. Distal right PICA versus AICA territory. Faint FLAIR hyperintensity. No evidence of associated hemorrhage. No mass effect. No other restricted diffusion. No midline shift, mass effect, ventriculomegaly, extra-axial collection. Outside of the right cerebellum gray and white matter signal appears within normal limits. No definite chronic cerebral blood products. IMPRESSION: 1. Truncated MRI reveals small acute infarct in the Right Cerebellum. No associated hemorrhage or mass effect. 2. Elsewhere negative limited MRI appearance of the brain. 3. These results were communicated to Dr. Iver Nestle at 6:57 am on 03/27/2022 by text page via the [facility] messaging system. Electronically Signed   By: Odessa Fleming M.D.   On: 03/27/2022 06:58   CT HEAD CODE STROKE WO CONTRAST  Result Date: 03/27/2022 CLINICAL DATA:  Code stroke. 41 year old male with left side deficits. EXAM: CT HEAD WITHOUT CONTRAST TECHNIQUE: Contiguous axial images were obtained from the base of the skull through the vertex without intravenous contrast. RADIATION DOSE REDUCTION: This exam was performed according to the departmental dose-optimization program which includes automated exposure control, adjustment of the  mA and/or kV according to patient size and/or use of iterative reconstruction technique. COMPARISON:  Head CT 12/27/2018. FINDINGS: Brain: Cerebral volume remains normal. No midline shift, ventriculomegaly, mass effect, evidence of mass lesion, intracranial hemorrhage or evidence of cortically based acute infarction. Gray-white matter differentiation is within normal limits  throughout the brain. Vascular: No suspicious intracranial vascular hyperdensity. Skull: No acute osseous abnormality identified. Sinuses/Orbits: Visualized paranasal sinuses and mastoids are clear. Other: Visualized orbits and scalp soft tissues are within normal limits. ASPECTS [facility] Stroke Program Early CT Score) Total score (0-10 with 10 being normal): 10 IMPRESSION: Stable and normal noncontrast CT appearance of the brain. ASPECTS 10. These results were communicated to Dr. Iver Nestle at 6:26 am on 03/27/2022 by text page via the [facility] messaging system. Electronically Signed   By: Odessa Fleming M.D.   On: 03/27/2022 06:26   DG Chest 2 View  Result Date: 03/27/2022 CLINICAL DATA:  Shortness of breath and cough. EXAM: CHEST - 2 VIEW COMPARISON:  January 21, 2022 FINDINGS: The heart size and mediastinal contours are within normal limits. Both lungs are clear. The visualized skeletal structures are unremarkable. IMPRESSION: No active cardiopulmonary disease. Electronically Signed   By: Aram Candela M.D.   On: 03/27/2022 03:58    PHYSICAL EXAM  Temp:  [97.7 F (36.5 C)-98.6 F (37 C)] 97.7 F (36.5 C) (08/16 1011) Pulse Rate:  [38-88] 64 (08/16 1500) Resp:  [11-28] 15 (08/16 1500) BP: (103-139)/(73-100) 108/80 (08/16 1500) SpO2:  [96 %-99 %] 97 % (08/16 1500) Weight:  [63 kg] 63 kg (08/16 0323)  General - Well nourished, well developed, middle-aged African-American male in no apparent distress. Cardiovascular - Regular rhythm and rate.  Mental Status -  Neuro exam- he is alert and oriented x 4. He is laying comfortable on stretcher in NAD. No aphasia or dysarthria. Left arm with drift 3/5 no pronation, left leg weak 4/5, sensation decreased on left left side with numbness with splitting of the forehead midline to touch and vibration  ASSESSMENT/PLAN Mr. Eddie Smith is a 41 y.o. male with history of history of depression, and polysubstance use disorder who presents to the emergency room  for chest pain and was found to have left-sided weakness and blurry vision.  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  Patient also has left hemiparesis and sensory loss possibly from small right subcortical infarct not visualized on MRI  Code Stroke CT head No acute abnormality. ASPECTS 10.    CTA head & neck  1. Negative for large vessel occlusion. 2. But positive for a beaded appearance of multiple large vessels in and around the skull base. Consider cocaine induced vasospasm, Fibromuscular Dysplasia (FMD), or might be artifactual. No significant arterial stenosis. 3. Carious posterior dentition.   MRI  1. Truncated MRI reveals small acute infarct in the Right Cerebellum. No associated hemorrhage or mass effect  2D Echo pending UDS cocaine and amphetamines LDL 64 HgbA1c 5.2 VTE prophylaxis - lovenox    Diet   Diet NPO time specified   aspirin 81 mg daily prior to admission, now on aspirin 81 mg daily and clopidogrel 75 mg daily. For 3 weeks then ASA alone  Therapy recommendations:  pending Disposition:  pending   Hypertension Home meds:  none Stable Permissive hypertension (OK if < 220/120) but gradually normalize in 5-7 days Long-term BP goal normotensive  Hyperlipidemia Home meds:  none LDL 64, goal < 70 High intensity statin not  indicated due to below goal  Continue statin at discharge   Other Stroke Risk Factors Cigarette smoker advised to stop smoking ETOH use, alcohol level <10, advised to drink no more than 1 drink(s) a day Substance abuse - UDS:  THC NONE DETECTED, Cocaine POSITIVE and amphetamines. Patient advised to stop using due to stroke risk.  Other Active Problems Alcohol abuse -[facility] Depression  Hospital day # 0  Gevena Mart DNP, ACNPC-AG   STROKE MD NOTE :  I have personally obtained history,examined this patient, reviewed notes, independently viewed imaging studies,  participated in medical decision making and plan of care.ROS completed by me personally and pertinent positives fully documented  I have made any additions or clarifications directly to the above note. Agree with note above.  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  Patient counseled to quit cocaine, amphetamine and cigarette smoking.  Recommend aspirin for stroke prevention and aggressive risk factor modification.  Physical occupational and speech therapy consults.  Mobilize out of bed.  Ongoing stroke work-up.  Greater than 50% time during this 50-minute visit.  In counseling and coordination of care about his stroke and substance abuse discussion about stroke prevention, evaluation and treatment and answering questions.  Delia Heady, MD Medical Director [facility] Pager: [phone] 03/27/2022 4:35 PM  To contact Stroke Continuity provider, please refer to [email or web address]. After hours, contact General Neurology

## 2022-03-27 NOTE — ED Provider Notes (Signed)
[facility] EMERGENCY DEPARTMENT Provider Note   CSN: [id number] Arrival date & time: 03/27/22  0324     History  Chief Complaint  Patient presents with   Chest Pain    Eddie Smith is a 41 y.o. male hypertension, alcohol use, polysubstance use.  Presents to the emergency department complaint of chest pain and left-sided numbness/weakness.  Patient reports that his chest pain started yesterday afternoon at 5 PM.  Pain is midsternal.  Pain is described as a squeezing.  Patient endorses nausea, vomiting, and diaphoresis with pain onset.  Patient reports that pain has been constant.  Patient also complains of left-sided numbness and weakness.  Patient reports that the symptoms started around 5 AM this morning.  Patient reports numbness and weakness to the entire left side of his body.  Patient also endorses blurred vision to his left eye.  Patient endorses cocaine use with last use yesterday morning.  Patient also endorses alcohol use.     Chest Pain      Home Medications Prior to Admission medications   Medication Sig Start Date End Date Taking? Authorizing Provider  albuterol (VENTOLIN HFA) 108 (90 Base) MCG/ACT inhaler Inhale 2 puffs into the lungs every 6 (six) hours as needed for wheezing or shortness of breath. 01/23/22   Lanae Boast, MD  naproxen (NAPROSYN) 375 MG tablet Take 1 tablet (375 mg total) by mouth 2 (two) times daily. 01/17/22   Kommor, Madison, MD  ondansetron (ZOFRAN-ODT) 4 MG disintegrating tablet Take 1 tablet (4 mg total) by mouth every 8 (eight) hours as needed for nausea or vomiting. 01/17/22   Kommor, Wyn Forster, MD      Allergies    Patient has no known allergies.    Review of Systems   Review of Systems  Unable to perform ROS: Acuity of condition  Cardiovascular:  Positive for chest pain.    Physical Exam Updated Vital Signs BP 112/80 (BP Location: Right Arm)   Pulse 73   Temp 98.6 F (37 C) (Oral)   Resp 17   Wt 63 kg    SpO2 97%   BMI 26.24 kg/m  Physical Exam Vitals and nursing note reviewed.  Constitutional:      General: He is not in acute distress.    Appearance: He is not ill-appearing, toxic-appearing or diaphoretic.  HENT:     Head: Normocephalic.  Eyes:     General: No scleral icterus.       Right eye: No discharge.        Left eye: No discharge.  Cardiovascular:     Rate and Rhythm: Normal rate.     Pulses:          Radial pulses are 2+ on the right side and 2+ on the left side.     Heart sounds: Normal heart sounds, S1 normal and S2 normal. Heart sounds not distant. No murmur heard. Pulmonary:     Effort: Pulmonary effort is normal. No tachypnea, bradypnea or respiratory distress.     Breath sounds: Normal breath sounds. No stridor.  Musculoskeletal:     Right lower leg: No edema.     Left lower leg: No edema.  Skin:    General: Skin is warm and dry.  Neurological:     Mental Status: He is alert and oriented to person, place, and time.     GCS: GCS eye subscore is 4. GCS verbal subscore is 5. GCS motor subscore is 6.  Cranial Nerves: No cranial nerve deficit, dysarthria or facial asymmetry.     Sensory: Sensation is intact.     Comments: Decreased sensation to left side of patient's face, left upper and lower extremity.  Weakness to left upper and lower extremity.  Decreased grip strength the left side.  Patient able to lift and hold right lower extremity against gravity.  +5 strength to dorsiflexion and plantarflexion to right lower extremity.  +5 strength to right upper extremity strength.  Psychiatric:        Behavior: Behavior is cooperative.       ED Results / Procedures / Treatments   Labs (all labs ordered are listed, but only abnormal results are displayed) Labs Reviewed  COMPREHENSIVE METABOLIC PANEL - Abnormal; Notable for the following components:      Result Value   Chloride 96 (*)    Glucose, Bld 128 (*)    All other components within normal limits   I-STAT CHEM 8, ED - Abnormal; Notable for the following components:   Sodium 133 (*)    Potassium 3.4 (*)    Chloride 95 (*)    Creatinine, Ser 0.60 (*)    Glucose, Bld 133 (*)    Calcium, Ion 1.09 (*)    Hemoglobin 17.7 (*)    All other components within normal limits  RESP PANEL BY RT-PCR (FLU A&B, COVID) ARPGX2  CBC WITH DIFFERENTIAL/PLATELET  LIPASE, BLOOD  ETHANOL  PROTIME-INR  APTT  RAPID URINE DRUG SCREEN, HOSP PERFORMED  URINALYSIS, ROUTINE W REFLEX MICROSCOPIC  ACETAMINOPHEN LEVEL  SALICYLATE LEVEL  TROPONIN I (HIGH SENSITIVITY)  TROPONIN I (HIGH SENSITIVITY)    EKG EKG Interpretation  Date/Time:  Wednesday March 27 2022 03:42:43 EDT Ventricular Rate:  71 PR Interval:  130 QRS Duration: 86 QT Interval:  378 QTC Calculation: 410 R Axis:   56 Text Interpretation: Normal sinus rhythm with sinus arrhythmia Nonspecific ST abnormality Confirmed by Palumbo, April ([id number]) on 03/27/2022 6:26:14 AM  Radiology CT ANGIO HEAD NECK W WO CM (CODE STROKE)  Result Date: 03/27/2022 CLINICAL DATA:  41 year old male code stroke presentation with small acute right cerebellar infarct on MRI. History of cocaine use. EXAM: CT ANGIOGRAPHY HEAD AND NECK TECHNIQUE: Multidetector CT imaging of the head and neck was performed using the standard protocol during bolus administration of intravenous contrast. Multiplanar CT image reconstructions and MIPs were obtained to evaluate the vascular anatomy. Carotid stenosis measurements (when applicable) are obtained utilizing NASCET criteria, using the distal internal carotid diameter as the denominator. RADIATION DOSE REDUCTION: This exam was performed according to the departmental dose-optimization program which includes automated exposure control, adjustment of the mA and/or kV according to patient size and/or use of iterative reconstruction technique. CONTRAST:  29mL OMNIPAQUE IOHEXOL 350 MG/ML SOLN COMPARISON:  Plain head CT and limited brain MRI  this morning. FINDINGS: CTA NECK Skeleton: Carious posterior dentition. No acute osseous abnormality identified. Upper chest: Negative. Other neck: No acute neck soft tissue finding. Aortic arch: No arch atherosclerosis. Three vessel arch configuration. Right carotid system: Negative.  No atherosclerosis or stenosis. Left carotid system: Negative except for subtle beaded appearance of the left ICA below the skull base on series 5, image 77. No stenosis. Vertebral arteries: Negative proximal right subclavian artery. Non dominant right vertebral artery with venous contrast contamination about the origin and V1 segment. The right vertebral remains diminutive but patent to the skull base. No atherosclerosis or stenosis identified, however, slightly beaded appearance of the vessel in the V3 segment series  5, image 77. Negative proximal left subclavian artery and left vertebral artery origin. Dominant left vertebral artery is patent and appears normal to the skull base. CTA HEAD Posterior circulation: Non dominant right vertebral terminates in PICA which appears to remain patent. Dominant left vertebral artery supplies the basilar, with subtle beaded appearance in the V4 segment series 6, image 260. No distal left vertebral plaque or stenosis. Patent basilar artery without stenosis. Patent basilar tip, SCA and PCA origins. Posterior communicating arteries are diminutive or absent. Bilateral PCA branches are within normal limits. Anterior circulation: Both ICA siphons are patent, the left appears dominant owing to the ACA anatomy. Minimal siphon plaque. No stenosis identified, although beaded appearance of the supraclinoid right ICA series 6, image 193. Patent carotid termini. Patent MCA origins. Dominant left and diminutive or absent right ACA A1 segments. Anterior communicating artery and bilateral ACA branches are patent. Subtle beaded appearance of the left ACA A1 segment. Similar beaded appearance of the bilateral MCA  M1 segments (series 6 images 171 and 189. Left MCA bifurcation is patent without stenosis. Right MCA trifurcation is patent without stenosis. Other bilateral MCA branches are within normal limits. Venous sinuses: Early contrast timing, but appears patent. Anatomic variants: Dominant left vertebral artery, the right terminates in PICA. Dominant left and diminutive or absent right ACA A1 segments. Review of the MIP images confirms the above findings Preliminary report discussed by telephone with Dr. Brooke Dare on 03/27/2022 at 07:04 . IMPRESSION: 1. Negative for large vessel occlusion. 2. But positive for a beaded appearance of multiple large vessels in and around the skull base. Consider cocaine induced vasospasm, Fibromuscular Dysplasia (FMD), or might be artifactual. No significant arterial stenosis. 3. Carious posterior dentition. Salient findings discussed by telephone with Dr. Brooke Dare on 03/27/2022 at 07:04 . Electronically Signed   By: Odessa Fleming M.D.   On: 03/27/2022 07:12   MR BRAIN WO CONTRAST  Result Date: 03/27/2022 CLINICAL DATA:  41 year old male code stroke presentation with left side deficits. EXAM: MRI HEAD WITHOUT CONTRAST TECHNIQUE: Multiplanar, multiecho pulse sequences of the brain and surrounding structures were obtained without intravenous contrast. COMPARISON:  Plain head CT 0622 hours today. FINDINGS: The examination had to be discontinued prior to completion due to patient condition. No T1 weighted imaging or T2 weighted imaging obtained. DWI, SWI, and FLAIR were obtained. DWI reveals a small focus of patchy restricted diffusion in the right cerebellum (series 5, image 59), encompassing about 8 mm. Distal right PICA versus AICA territory. Faint FLAIR hyperintensity. No evidence of associated hemorrhage. No mass effect. No other restricted diffusion. No midline shift, mass effect, ventriculomegaly, extra-axial collection. Outside of the right cerebellum gray and white matter signal  appears within normal limits. No definite chronic cerebral blood products. IMPRESSION: 1. Truncated MRI reveals small acute infarct in the Right Cerebellum. No associated hemorrhage or mass effect. 2. Elsewhere negative limited MRI appearance of the brain. 3. These results were communicated to Dr. Iver Nestle at 6:57 am on 03/27/2022 by text page via the [facility] messaging system. Electronically Signed   By: Odessa Fleming M.D.   On: 03/27/2022 06:58   CT HEAD CODE STROKE WO CONTRAST  Result Date: 03/27/2022 CLINICAL DATA:  Code stroke. 41 year old male with left side deficits. EXAM: CT HEAD WITHOUT CONTRAST TECHNIQUE: Contiguous axial images were obtained from the base of the skull through the vertex without intravenous contrast. RADIATION DOSE REDUCTION: This exam was performed according to the departmental dose-optimization program which includes automated exposure control,  adjustment of the mA and/or kV according to patient size and/or use of iterative reconstruction technique. COMPARISON:  Head CT 12/27/2018. FINDINGS: Brain: Cerebral volume remains normal. No midline shift, ventriculomegaly, mass effect, evidence of mass lesion, intracranial hemorrhage or evidence of cortically based acute infarction. Gray-white matter differentiation is within normal limits throughout the brain. Vascular: No suspicious intracranial vascular hyperdensity. Skull: No acute osseous abnormality identified. Sinuses/Orbits: Visualized paranasal sinuses and mastoids are clear. Other: Visualized orbits and scalp soft tissues are within normal limits. ASPECTS [facility]([place] Stroke Program Early CT Score) Total score (0-10 with 10 being normal): 10 IMPRESSION: Stable and normal noncontrast CT appearance of the brain. ASPECTS 10. These results were communicated to Dr. Iver NestleBhagat at 6:26 am on 03/27/2022 by text page via the [facility] messaging system. Electronically Signed   By: Odessa FlemingH  Hall M.D.   On: 03/27/2022 06:26   DG Chest 2 View  Result Date:  03/27/2022 CLINICAL DATA:  Shortness of breath and cough. EXAM: CHEST - 2 VIEW COMPARISON:  January 21, 2022 FINDINGS: The heart size and mediastinal contours are within normal limits. Both lungs are clear. The visualized skeletal structures are unremarkable. IMPRESSION: No active cardiopulmonary disease. Electronically Signed   By: Aram Candelahaddeus  Houston M.D.   On: 03/27/2022 03:58    Procedures .Critical Care  Performed by: Haskel SchroederBadalamente, Kamarri Lovvorn R, PA-C Authorized by: Haskel SchroederBadalamente, Callyn Severtson R, PA-C   Critical care provider statement:    Critical care time (minutes):  30   Critical care was necessary to treat or prevent imminent or life-threatening deterioration of the following conditions:  CNS failure or compromise   Critical care was time spent personally by me on the following activities:  Development of treatment plan with patient or surrogate, discussions with consultants, evaluation of patient's response to treatment, examination of patient, ordering and review of laboratory studies, ordering and review of radiographic studies, ordering and performing treatments and interventions, pulse oximetry, re-evaluation of patient's condition, review of old charts and obtaining history from patient or surrogate   Care discussed with: admitting provider       Medications Ordered in ED Medications  ondansetron (ZOFRAN-ODT) disintegrating tablet 4 mg (has no administration in time range)  sodium chloride 0.9 % bolus 500 mL (has no administration in time range)    Followed by  0.9 %  sodium chloride infusion (has no administration in time range)    ED Course/ Medical Decision Making/ A&P Clinical Course as of 03/27/22 0908  Wed Mar 27, 2022  0813 I spoke to patient's mother at his request and updated her on his medical condition.  I attempted to contact patient's wife for the same however she could not be reached at number listed on demographics or at [phone]. [PB]    Clinical Course User Index [PB]  Haskel SchroederBadalamente, Isidro Monks R, PA-C                           Medical Decision Making Amount and/or Complexity of Data Reviewed Labs: ordered.  Risk Decision regarding hospitalization.   Alert 41 year old male nontoxic-appearing, in no acute distress.  Patient is lying on CT scanner.  Presents to the emergency department with chief complaint of chest pain and left-sided numbness/weakness.  Information was obtained from patient.  I reviewed patient's past medical record including previous provider notes, labs, and imaging.  Patient has medical history as outlined in HPI which complicates care.  Patient was noted to have left-sided neurodeficits triage screening.  Code  stroke was activated.  Patient's is patent airway.  Was taken straight to CT imaging and MRI imaging.  Patient was evaluated by neurologist Dr.Bahgat.  Additionally ACS work-up initiated due to patient's report of chest pain.  I personally viewed and interpreted patient's EKG.  Tracing shows sinus rhythm with nonspecific T and ST changes.  I personally viewed interpret patient's chest x-ray.  Imaging shows no active cardiopulmonary disease.  I personally viewed interpret patient's lab results.  Pertinent findings include: -Troponin 5 -CMP and CBC unremarkable  I personally viewed interpret patient's noncontrast head CT.  Agree with radiology interpretation of no acute intracranial abnormality.  MRI imaging shows small acute infarct in the right cerebellum with no associated hemorrhage or mass effect.  Results were delivered to Dr. Iver Nestle.  I spoke to Dr. Iver Nestle who advised that patient admitted to medicine team for further stroke work-up.  No interventions from neurologic standpoint at this time.  I spoke to Dr. Cyndie Chime who will see the patient for admission.  Patient was discussed with and evaluated by Dr. Daun Peacock.  Patient care discussed with attending physician Dr. Rodena Medin.        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)    Rx / DC Orders ED Discharge Orders     None         Berneice Heinrich 03/27/22 0912    Palumbo, April, MD 03/27/22 2307

## 2022-03-27 NOTE — ED Triage Notes (Signed)
Pt in via [facility] with central cp, cough. Per EMS, he was dx with PNA 1 mo ago, but had to move and did not finish entire course of abx in the process. VS en route 132/80 - 76 - 98% - CBG 99

## 2022-03-27 NOTE — ED Provider Triage Note (Signed)
Emergency Medicine Provider Triage Evaluation Note  Eddie Smith , a 41 y.o. male  was evaluated in triage.  Pt was brought back to the triage area due to concern for new numbness and weakness to the left upper extremity and lower extremity.  Also endorses blurry vision from his left eye that started around 5 AM.  States that he notified a tech in the waiting room at that time but was not brought to the triage until now.  Review of Systems  Positive: Left-sided numbness and tingling in the left upper and lower extremity, weakness, blurry vision in the left eye, headache Negative: Chest pain, shortness of breath  Physical Exam  BP 112/80 (BP Location: Right Arm)   Pulse 73   Temp 98.6 F (37 C) (Oral)   Resp 17   Wt 63 kg   SpO2 97%   BMI 26.24 kg/m  Gen:   Awake, no distress   Resp:  Normal effort  MSK:   Moves extremities without difficulty  Other:  PERRL, EOMI.  Left-sided sensory deficit in the face, left upper extremity and left lower extremity sensory deficit.  Significantly decreased grip strength in the left compared to the right though somewhat effort mediated.  Decreased strength in knee extension and flexion on the left compared to the right as well.  Patient unable to identify numbers of fingers to the left eye though does not have difficulty in the right eye.  Medical Decision Making  Medically screening exam initiated at 6:14 AM.  Appropriate orders placed.  [employer] was informed that the remainder of the evaluation will be completed by another provider, this initial triage assessment does not replace that evaluation, and the importance of remaining in the ED until their evaluation is complete.  Last known well 1 hour prior to my reevaluation of this patient in triage.  Concerning findings on neurologic exam at this time.  We will activate code stroke to expedite further evaluation.  Charge RN informed and CT tech informed, patient to be transported to CT  1.   This chart was dictated using voice recognition software, Dragon. Despite the best efforts of this provider to proofread and correct errors, errors may still occur which can change documentation meaning.    Paris Lore, PA-C 03/27/22 [phone]

## 2022-03-27 NOTE — ED Notes (Signed)
Pt taken to CT by this RN.  ?

## 2022-03-27 NOTE — ED Notes (Signed)
Lunch tray ordered 

## 2022-03-27 NOTE — ED Provider Triage Note (Signed)
Emergency Medicine Provider Triage Evaluation Note  Eddie Smith , a 41 y.o. male  was evaluated in triage.  Pt complains of cough, shortness of breath, rib pain, nausea and vomiting for the last few days.  States he feels similar to when he did when he had pneumonia during his admission in June of this year following drug overdose.  Review of Systems  Positive: Chest pain, shortness of breath, cough, nausea vomiting with NBNB emesis Negative: Diarrhea  Physical Exam  Wt 63 kg   BMI 26.24 kg/m  Gen:   Awake, ill-appearing but no acute distress   Resp:  Normal effort  MSK:   Moves extremities without difficulty  Other:  RRR no/R/G.  Lungs CTA B.  Abdomen soft nondistended nontender.  Medical Decision Making  Medically screening exam initiated at 3:29 AM.  Appropriate orders placed.  Eddie Smith was informed that the remainder of the evaluation will be completed by another provider, this initial triage assessment does not replace that evaluation, and the importance of remaining in the ED until their evaluation is complete.  This chart was dictated using voice recognition software, Dragon. Despite the best efforts of this provider to proofread and correct errors, errors may still occur which can change documentation meaning.    Eddie Lore, PA-C 03/27/22 [phone]

## 2022-03-27 NOTE — Consult Note (Signed)
Neurology Consultation Reason for Consult: Code stroke Requesting Physician: April Palumbo   CC: Left sided weakness and numbness  History is obtained from: Patient and chart review   HPI: Eddie Smith is a 41 y.o. male with a past medical history significant for hypertension, possible OSA, alcohol use and cocaine abuse, recent multifocal pneumonia (admitted 6/11 through 6/14 with drug overdose and pneumonia).  He reports he started feeling unwell yesterday with crushing chest pain, and then developed nausea and vomiting sometime this morning and chest pain.  He had a troponin while in the waiting room and then when returning for the second troponin drawn a reported new onset left-sided weakness and numbness reportedly last known well 5 AM but it is unclear if this is reliable; on further questioning he reports N/V started yesterday afternoon  LKW: 5 AM per patient report, but timeline is a bit unclear on further questioning tPA given?: No, FLAIR matching DWI change in CB on MRI IA performed?: No, no LVO Premorbid modified rankin scale:      0 - No symptoms.  ROS: Limited ROS  Past Medical History:  Diagnosis Date   Alcohol abuse    Hypertension 04/03/2012   Prostatitis    History reviewed. No pertinent surgical history.  Current Outpatient Medications  Medication Instructions   albuterol (VENTOLIN HFA) 108 (90 Base) MCG/ACT inhaler 2 puffs, Inhalation, Every 6 hours PRN   naproxen (NAPROSYN) 375 mg, Oral, 2 times daily   ondansetron (ZOFRAN-ODT) 4 mg, Oral, Every 8 hours PRN   Family History  Problem Relation Age of Onset   Hyperlipidemia Maternal Grandmother    Hyperlipidemia Maternal Grandfather    Asthma Brother    Cancer Father    Thyroid disease Mother    Osteoarthritis Mother    Hypertension Mother    Social History:  reports that he has been smoking cigarettes. He started smoking about 25 years ago. He has been smoking an average of 2 packs per day. He has  never used smokeless tobacco. He reports current alcohol use of about 84.0 standard drinks of alcohol per week. He reports current drug use. Drug: "Crack" cocaine.   Exam: Current vital signs: BP (!) 127/92   Pulse (!) 59   Temp 98.6 F (37 C) (Oral)   Resp 12   Wt 63 kg   SpO2 98%   BMI 26.24 kg/m  Vital signs in last 24 hours: Temp:  [98.5 F (36.9 C)-98.6 F (37 C)] 98.6 F (37 C) (08/16 0504) Pulse Rate:  [59-79] 59 (08/16 0715) Resp:  [12-18] 12 (08/16 0715) BP: (112-132)/(74-100) 127/92 (08/16 0715) SpO2:  [97 %-99 %] 98 % (08/16 0715) Weight:  [63 kg] 63 kg (08/16 0323)   Physical Exam  Constitutional: Appears well-developed and well-nourished.  Psych: Affect flat Eyes: No scleral injection HENT: No oropharyngeal obstruction.  MSK: no joint deformities.  Cardiovascular: Perfusing extremities well Respiratory: Effort normal, non-labored breathing GI: Soft.  No distension. There is no tenderness.  Skin: Warm dry and intact visible skin  Neuro: Mental Status: Patient is awake, alert, oriented to person, place, month, year, and situation. Patient gives an inconsistent history.  Slow to respond to questions and perseverates on thumb when he is asked what his little finger is, but at other times his speech is fluent Cranial Nerves: II: Visual Fields are full to blink to threat but he reports not being able to see well to the left. Pupils are equal, round, and reactive to light.  III,IV, VI: On formal testing he will not track examiner over to the left side, but he is observed to move his eyes to the left when not being formally examined V: Facial sensation is symmetric to light eyelash brush VII: Poor effort on smile bilaterally, no clear droop at rest VIII: hearing is intact to voice X: Uvula elevates symmetrically XII: tongue is midline without atrophy or fasciculations.  Motor: Tone is normal. Bulk is normal.  He has downward drift of the left upper extremity  without clear pronation and very variable effort.  No drift of the right upper extremity.  Drift of the right lower extremity without touching the bed.  Variable effort with the left lower extremity, at best he can raise it briefly antigravity.  However on later testing when I try to lift his leg up he actively resists the movement with at least 4/5 strength Sensory: Sensation is reduced on the left side Cerebellar: Finger-nose and heel-to-shin are intact on the right side Gait:  Deferred in acute setting   NIHSS total 11 Score breakdown: One-point for not looking to with the left consistently, one-point for reported left blurry vision, 2 points for the left arm weakness, 2 points for left leg weakness, one-point for right leg weakness, one-point for sensory loss on the left side, one-point for mild aphasia, one-point for mild dysarthria, one-point for neglect of the right Performed at time of patient arrival to ED    I have reviewed labs in epic and the results pertinent to this consultation are:  Basic Metabolic Panel: Recent Labs  Lab 03/27/22 0338 03/27/22 0623  NA 135 133*  K 3.7 3.4*  CL 96* 95*  CO2 31  --   GLUCOSE 128* 133*  BUN 7 6  CREATININE 0.75 0.60*  CALCIUM 9.5  --     CBC: Recent Labs  Lab 03/27/22 0338 03/27/22 0623  WBC 7.3  --   NEUTROABS 5.2  --   HGB 16.1 17.7*  HCT 48.6 52.0  MCV 99.6  --   PLT 270  --     Coagulation Studies: Recent Labs    03/27/22 0616  LABPROT 12.6  INR 1.0       I have reviewed the images obtained:  CT head personally reviewed, agree with radiology:   Stable and normal noncontrast CT appearance of the brain. ASPECTS 10.  CTA personally reviewed, agree with radiology:   1. Negative for large vessel occlusion. 2. But positive for a beaded appearance of multiple large vessels in and around the skull base. Consider cocaine induced vasospasm, Fibromuscular Dysplasia (FMD), or might be artifactual. No significant  arterial stenosis. 3. Carious posterior dentition.  MRI personally reviewed, agree with radiology:   1. Truncated MRI reveals small acute infarct in the Right Cerebellum. No associated hemorrhage or mass effect. 2. Elsewhere negative limited MRI appearance of the brain.  ECHO January 22 2022   1. Left ventricular ejection fraction, by estimation, is 60 to 65%. The  left ventricle has normal function. The left ventricle has no regional  wall motion abnormalities. Left ventricular diastolic parameters were  normal.   2. Right ventricular systolic function is normal. The right ventricular  size is mildly enlarged. There is mildly elevated pulmonary artery  systolic pressure. The estimated right ventricular systolic pressure is  36.1 mmHg.   3. The mitral valve is normal in structure. No evidence of mitral valve  regurgitation. No evidence of mitral stenosis.   4. The aortic valve  is tricuspid. Aortic valve regurgitation is not  visualized. No aortic stenosis is present.   5. The inferior vena cava is dilated in size with >50% respiratory  variability, suggesting right atrial pressure of 8 mmHg.   Impression: Stroke likely secondary to cocaine use given age and lack of other risk factors.  Interestingly the punctate lesion may be expected to cause some mild left upper extremity symptoms but not to the severity described by the patient.  He does have significant functional overlay on his examination which may be elaboration of the underlying deficits.  Alternatively he may have a small brainstem stroke missed on MRI.  Fortunately he does not have a large vessel occlusion.    Code stroke recommendations -MRI brain given unclear LKW and functional overlay on exam -CTA to clear posterior circulation  Additional Recommendations: -Admit to medicine for stroke work-up -Cocaine cessation counseling -Alcohol cessation counseling -CIWA protocol, thiamine, multivitamin, folate to be ordered by  primary team -A1c, lipid panel, echocardiogram ordered, UA, UDS -Permissive hypertension for now, with concern for cocaine induced vasospasm would avoid beta-blockers and preferred calcium channel blockers if needed -Stroke team to follow in consultation  Brooke Dare MD-PhD [facility] [phone] Available 7 PM to 7 AM, outside of these hours please call Neurologist on call as listed on Amion.  Total critical care time: 60 minutes   Critical care time was exclusive of separately billable procedures and treating other patients.   Critical care was necessary to treat or prevent imminent or life-threatening deterioration.   Critical care was time spent personally by me on the following activities: development of treatment plan with patient and/or surrogate as well as nursing, discussions with consultants/primary team, evaluation of patient's response to treatment, examination of patient, obtaining history from patient or surrogate, ordering and performing treatments and interventions, ordering and review of laboratory studies, ordering and review of radiographic studies, and re-evaluation of patient's condition as needed, as documented above.

## 2022-03-27 NOTE — Code Documentation (Signed)
Responded to Code Stroke called on pt already in the ED for chest pain. Code Stroke called at 0617 when pt told RNs in triage that he began having numbness in his L side at 0500. CBG-133, NIH-11, CT head-no acute changes. After CT, pt began to report N/V prior to arrival to ED. Pt transported to MRI. MRI + for stroke with a diffusion/flair MATCH. After MRI, pt reports N/V present yesterday afternoon as well. LSN-8/15 in the afternoon. Pt transported back to CT for CTA. CTA-no LVO. TNK not given-no diffusion/flair mismatch-outside window. Plan to admit for stroke workup. VS/neuro exams q2h x 12, then q4h.

## 2022-03-27 NOTE — ED Notes (Signed)
Lunch Tray ordered for Pt

## 2022-03-27 NOTE — ED Notes (Signed)
Pt returned to triage for lab redraws, and reported to staff that he developed L arm numbness and weakness around 0500. Neuro exam done by staff at this time, and code stroke called.

## 2022-03-28 DIAGNOSIS — I63441 Cerebral infarction due to embolism of right cerebellar artery: Secondary | ICD-10-CM

## 2022-03-28 DIAGNOSIS — F17210 Nicotine dependence, cigarettes, uncomplicated: Secondary | ICD-10-CM | POA: Diagnosis not present

## 2022-03-28 DIAGNOSIS — Z7151 Drug abuse counseling and surveillance of drug abuser: Secondary | ICD-10-CM

## 2022-03-28 DIAGNOSIS — F1090 Alcohol use, unspecified, uncomplicated: Secondary | ICD-10-CM | POA: Diagnosis not present

## 2022-03-28 DIAGNOSIS — F1490 Cocaine use, unspecified, uncomplicated: Secondary | ICD-10-CM | POA: Diagnosis not present

## 2022-03-28 DIAGNOSIS — R1033 Periumbilical pain: Secondary | ICD-10-CM

## 2022-03-28 DIAGNOSIS — M318 Other specified necrotizing vasculopathies: Secondary | ICD-10-CM | POA: Diagnosis not present

## 2022-03-28 LAB — BASIC METABOLIC PANEL
Anion gap: 5 (ref 5–15)
BUN: 6 mg/dL (ref 6–20)
CO2: 26 mmol/L (ref 22–32)
Calcium: 8.6 mg/dL — ABNORMAL LOW (ref 8.9–10.3)
Chloride: 103 mmol/L (ref 98–111)
Creatinine, Ser: 0.89 mg/dL (ref 0.61–1.24)
GFR, Estimated: >60 mL/min (ref >60)
Glucose, Bld: 97 mg/dL (ref 70–99)
Potassium: 4.4 mmol/L (ref 3.5–5.1)
Sodium: 134 mmol/L — ABNORMAL LOW (ref 135–145)

## 2022-03-28 LAB — CBC
HCT: 47.8 % (ref 39.0–52.0)
Hemoglobin: 15.6 g/dL (ref 13.0–17.0)
MCH: 33.7 pg (ref 26.0–34.0)
MCHC: 32.6 g/dL (ref 30.0–36.0)
MCV: 103.2 fL — ABNORMAL HIGH (ref 80.0–100.0)
Platelets: 235 10*3/uL (ref 150–400)
RBC: 4.63 MIL/uL (ref 4.22–5.81)
RDW: 14.3 % (ref 11.5–15.5)
WBC: 6.3 10*3/uL (ref 4.0–10.5)
nRBC: 0 % (ref 0.0–0.2)

## 2022-03-28 LAB — PHOSPHORUS: Phosphorus: 3.3 mg/dL (ref 2.5–4.6)

## 2022-03-28 LAB — CBG MONITORING, ED: Glucose-Capillary: 99 mg/dL (ref 70–99)

## 2022-03-28 LAB — MAGNESIUM: Magnesium: 2 mg/dL (ref 1.7–2.4)

## 2022-03-28 MED ORDER — ALUM & MAG HYDROXIDE-SIMETH 200-200-20 MG/5ML PO SUSP
30.0000 mL | Freq: Once | ORAL | Status: AC
  Administered 2022-03-28: 30 mL via ORAL
  Filled 2022-03-28: qty 30

## 2022-03-28 NOTE — Evaluation (Signed)
Occupational Therapy Evaluation Patient Details Name: Eddie Smith MRN: [id number] DOB: [birth year] Today's Date: 03/28/2022   History of Present Illness Mr. 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  Patient also has left hemiparesis and sensory loss possibly from small right subcortical infarct not visualized on MRI   Clinical Impression   Pt currently min to mod assist for simulated selfcare tasks overall.  Movements were more limited in the UE when therapist asked for specific movements vs when he would spontaneously use it, not matching up with what you would typically see from a cerebellar CVA.  Min assist for stand pivot transfers with mod assist for mobility to and from the door of the room without use of an assistive device.  Feel he will benefit from acute care OT to progress ADL independence back to a modified independent level.       Recommendations for follow up therapy are one component of a multi-disciplinary discharge planning process, led by the attending physician.  Recommendations may be updated based on patient status, additional functional criteria and insurance authorization.   Follow Up Recommendations  Home health OT    Assistance Recommended at Discharge Frequent or constant Supervision/Assistance  Patient can return home with the following A little help with walking and/or transfers;A little help with bathing/dressing/bathroom;Assistance with cooking/housework;Assist for transportation;Help with stairs or ramp for entrance    Functional Status Assessment  Patient has had a recent decline in their functional status and demonstrates the ability to make significant improvements in function in a reasonable and predictable amount of time.  Equipment Recommendations  Other (comment) (TBD)       Precautions / Restrictions Precautions Precautions: Fall Precaution Comments: left side  weakness Restrictions Weight Bearing Restrictions: No      Mobility Bed Mobility Overal bed mobility: Needs Assistance Bed Mobility: Supine to Sit, Sit to Supine     Supine to sit: Min assist Sit to supine: Min assist   General bed mobility comments: assist with bringing the LLE off and onto the bed    Transfers Overall transfer level: Needs assistance   Transfers: Sit to/from Stand, Bed to chair/wheelchair/BSC Sit to Stand: Min assist     Step pivot transfers: Min assist            Balance Overall balance assessment: Needs assistance Sitting-balance support: Feet unsupported, No upper extremity supported Sitting balance-Leahy Scale: Good     Standing balance support: During functional activity Standing balance-Leahy Scale: Poor Standing balance comment: Pt needed UE support on surface for mobility and for standing.                           ADL either performed or assessed with clinical judgement   ADL Overall ADL's : Needs assistance/impaired Eating/Feeding: Modified independent;Sitting   Grooming: Wash/dry hands;Wash/dry face;Supervision/safety   Upper Body Bathing: Minimal assistance;Sitting   Lower Body Bathing: Minimal assistance;Sit to/from stand   Upper Body Dressing : Minimal assistance;Sitting   Lower Body Dressing: Moderate assistance;Sit to/from stand       Toileting- Architect and Hygiene: Minimal assistance;Sit to/from stand       Functional mobility during ADLs: Moderate assistance (ambulation with no device to the door of the room and back) General ADL Comments: Pt with flat affect throughout session.  Min assist for sit to stand with increased lean to the right to keep weight off of the LLE.  With mobility, he was able to advance the LLE and control it but exhibited slight decreased stance phase ability.  Decreased LUE arm and hand movement noted when asked to move it but did not note as much difficulty when  completing bed mobility or putting on his left gripper sock.     Vision Baseline Vision/History: 0 No visual deficits Ability to See in Adequate Light: 1 Impaired Patient Visual Report: Blurring of vision (pt reports blurring of vision in the left eye) Vision Assessment?: Yes Eye Alignment: Within Functional Limits Ocular Range of Motion: Within Functional Limits Alignment/Gaze Preference: Within Defined Limits Tracking/Visual Pursuits: Able to track stimulus in all quads without difficulty Saccades: Within functional limits Convergence: Within functional limits Visual Fields: No apparent deficits Additional Comments: Pt with blurriness in the left eye with slight redness noted as well.  When isolated he could see normal visual acuity in the right eye but then it was blurry with the left.     Perception     Praxis      Pertinent Vitals/Pain Pain Assessment Pain Assessment: 0-10 Pain Score: 7  Pain Location: chest pain Pain Descriptors / Indicators: Discomfort Pain Intervention(s): Limited activity within patient's tolerance, RN gave pain meds during session     Hand Dominance Right   Extremity/Trunk Assessment Upper Extremity Assessment Upper Extremity Assessment: Defer to OT evaluation LUE Deficits / Details: Pt with isolated movement spontaeously at all joints, however when asked to bend up his arm, or close his hand, he could not complete without assistance.  He was able to complete shoulder flexion in the LUE up to approximately 90 degrees but then stated it was too tight to go further.  Therapist was able to assist with flexion to [facility] for ROM.  There were also limitations in elbow flexion and extension when asked to bend up the arm, however when asked to not let therapist bend his arm strength was 4/5.  Inconsistencies with all testing noted. LUE Sensation: decreased light touch (decreased light touch reported in the lower arm and hand with gross testing with pt reporting  tingling in digits 3-5 at the tips.) LUE Coordination: decreased fine motor;decreased gross motor   Lower Extremity Assessment Lower Extremity Assessment: LLE deficits/detail LLE Deficits / Details: Pt reporting weakness, but was at 4/5 throughout. Was walking on toes throughout gait. When asked if LLE was hurting, pt reporting it was just weak. Also was able to lift LLE back to bed without difficulty.   Cervical / Trunk Assessment Cervical / Trunk Assessment: Normal   Communication Communication Communication: No difficulties   Cognition Arousal/Alertness: Awake/alert Behavior During Therapy: Flat affect Overall Cognitive Status: Within Functional Limits for tasks assessed                                                  Home Living Family/patient expects to be discharged to:: Private residence Living Arrangements: Spouse/significant other Available Help at Discharge: Available 24 hours/day;Family Type of Home: House Home Access: Level entry     Home Layout: One level     Bathroom Shower/Tub: Chief Strategy Officer: Standard     Home Equipment: None      Lives With: Spouse    Prior Functioning/Environment Prior Level of Function : Independent/Modified Independent  OT Problem List: Decreased strength;Decreased range of motion;Impaired balance (sitting and/or standing);Impaired sensation;Decreased coordination;Impaired vision/perception;Decreased knowledge of use of DME or AE;Impaired UE functional use      OT Treatment/Interventions: Self-care/ADL training;Balance training;Patient/family education;Therapeutic exercise;Neuromuscular education;Therapeutic activities;DME and/or AE instruction;Cognitive remediation/compensation    OT Goals(Current goals can be found in the care plan section) Acute Rehab OT Goals Patient Stated Goal: Get back to using his left arm and left leg OT Goal Formulation: With  patient Time For Goal Achievement: 04/11/22 Potential to Achieve Goals: Good ADL Goals Pt Will Perform Grooming: with supervision;standing Pt Will Perform Upper Body Bathing: with set-up;sitting Pt Will Perform Lower Body Bathing: with supervision;sit to/from stand Pt Will Perform Upper Body Dressing: with set-up;sitting Pt Will Perform Lower Body Dressing: with supervision;sit to/from stand Pt Will Transfer to Toilet: with supervision;ambulating Pt Will Perform Toileting - Clothing Manipulation and hygiene: with supervision;sit to/from stand Pt Will Perform Tub/Shower Transfer: Tub transfer;shower seat;Stand pivot transfer;ambulating;rolling walker Pt/caregiver will Perform Home Exercise Program: Right Upper extremity;Increased ROM;Increased strength;With written HEP provided (AROM)  OT Frequency: Min 2X/week    Co-evaluation              AM-PAC OT "6 Clicks" Daily Activity     Outcome Measure Help from another person eating meals?: None Help from another person taking care of personal grooming?: A Little Help from another person toileting, which includes using toliet, bedpan, or urinal?: A Little Help from another person bathing (including washing, rinsing, drying)?: A Little Help from another person to put on and taking off regular upper body clothing?: A Little Help from another person to put on and taking off regular lower body clothing?: A Little 6 Click Score: 19   End of Session Equipment Utilized During Treatment: Gait belt Nurse Communication: Mobility status  Activity Tolerance: Patient tolerated treatment well Patient left: in bed;with call bell/phone within reach  OT Visit Diagnosis: Unsteadiness on feet (R26.81);Muscle weakness (generalized) (M62.81);Hemiplegia and hemiparesis Hemiplegia - Right/Left: Left Hemiplegia - dominant/non-dominant: Non-Dominant Hemiplegia - caused by: Cerebral infarction                Time: 3710-6269 OT Time Calculation (min): 42  min Charges:  OT General Charges $OT Visit: 1 Visit OT Evaluation $OT Eval Moderate Complexity: 1 Mod OT Treatments $Self Care/Home Management : 23-37 mins  Danne Scardina OTR/L 03/28/2022, 2:54 PM

## 2022-03-28 NOTE — Evaluation (Signed)
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  Patient also has left hemiparesis and sensory loss possibly from small right subcortical infarct not visualized on MRI  Clinical Impression  Pt admitted secondary to problem above with deficits below. Pt reporting weakness in LLE, and tended to walk on L toes, but did not have any buckling. Min guard for safety throughout. Educated about using RW for mobility tasks. Recommending HHPT to address current deficits. Will continue to follow acutely.        Recommendations for follow up therapy are one component of a multi-disciplinary discharge planning process, led by the attending physician.  Recommendations may be updated based on patient status, additional functional criteria and insurance authorization.  Follow Up Recommendations Home health PT      Assistance Recommended at Discharge Intermittent Supervision/Assistance  Patient can return home with the following  Assistance with cooking/housework;Assist for transportation    Equipment Recommendations Rolling walker (2 wheels)  Recommendations for Other Services       Functional Status Assessment Patient has had a recent decline in their functional status and demonstrates the ability to make significant improvements in function in a reasonable and predictable amount of time.     Precautions / Restrictions Precautions Precautions: Fall Precaution Comments: left side weakness Restrictions Weight Bearing Restrictions: No      Mobility  Bed Mobility Overal bed mobility: Needs Assistance Bed Mobility: Supine to Sit, Sit to Supine     Supine to sit: Supervision Sit to supine: Supervision   General bed mobility comments: Supervision for safety     Transfers Overall transfer level: Needs assistance Equipment used: None Transfers: Sit to/from Stand Sit to Stand: Min guard           General transfer comment: Min guard for safety. Placing most the weight through RLE.    Ambulation/Gait Ambulation/Gait assistance: Min guard Gait Distance (Feet): 15 Feet Assistive device: IV Pole Gait Pattern/deviations: Step-through pattern, Decreased stride length, Decreased weight shift to left Gait velocity: Decreased     General Gait Details: Slow, cautious gait. Walking on toes on LLE and with bent leg. When asked if his leg hurt he said that it was just weak.  Stairs            Wheelchair Mobility    Modified Rankin (Stroke Patients Only)       Balance Overall balance assessment: Needs assistance Sitting-balance support: Feet unsupported, No upper extremity supported Sitting balance-Leahy Scale: Good     Standing balance support: No upper extremity supported, Single extremity supported Standing balance-Leahy Scale: Fair                               Pertinent Vitals/Pain Pain Assessment Pain Assessment: Faces Faces Pain Scale: Hurts even more Pain Location: chest Pain Descriptors / Indicators: Discomfort Pain Intervention(s): Limited activity within patient's tolerance, Monitored during session, Repositioned    Home Living Family/patient expects to be discharged to:: Private residence Living Arrangements: Spouse/significant other Available Help at Discharge: Available 24 hours/day;Family Type of Home: House Home Access: Level entry       Home Layout: One level Home Equipment: None      Prior Function Prior Level of Function : Independent/Modified Independent  Hand Dominance   Dominant Hand: Right    Extremity/Trunk Assessment   Upper Extremity Assessment Upper Extremity Assessment: Defer to OT evaluation LUE Deficits / Details: Pt with isolated movement  spontaeously at all joints, however when asked to bend up his arm, or close his hand, he could not complete without assistance.  He was able to complete shoulder flexion in the LUE up to approximately 90 degrees but then stated it was too tight to go further.  Therapist was able to assist with flexion to [facility] for ROM.  There were also limitations in elbow flexion and extension when asked to bend up the arm, however when asked to not let therapist bend his arm strength was 4/5.  Inconsistencies with all testing noted. LUE Sensation: decreased light touch (decreased light touch reported in the lower arm and hand with gross testing with pt reporting tingling in digits 3-5 at the tips.) LUE Coordination: decreased fine motor;decreased gross motor    Lower Extremity Assessment Lower Extremity Assessment: LLE deficits/detail LLE Deficits / Details: Pt reporting weakness, but was at 4/5 throughout. Was walking on toes throughout gait. When asked if LLE was hurting, pt reporting it was just weak. Also was able to lift LLE back to bed without difficulty.    Cervical / Trunk Assessment Cervical / Trunk Assessment: Normal  Communication   Communication: No difficulties  Cognition Arousal/Alertness: Awake/alert Behavior During Therapy: Flat affect Overall Cognitive Status: No family/caregiver present to determine baseline cognitive functioning                                          General Comments      Exercises     Assessment/Plan    PT Assessment Patient needs continued PT services  PT Problem List Decreased strength;Decreased activity tolerance;Decreased balance;Decreased mobility;Decreased knowledge of use of DME;Decreased knowledge of precautions       PT Treatment Interventions DME instruction;Gait training;Stair training;Functional mobility training;Therapeutic activities;Therapeutic exercise;Balance training;Patient/family education    PT Goals (Current goals can be  found in the Care Plan section)  Acute Rehab PT Goals Patient Stated Goal: to be independent PT Goal Formulation: With patient Time For Goal Achievement: 04/11/22 Potential to Achieve Goals: Good    Frequency Min 4X/week     Co-evaluation               AM-PAC PT "6 Clicks" Mobility  Outcome Measure Help needed turning from your back to your side while in a flat bed without using bedrails?: None Help needed moving from lying on your back to sitting on the side of a flat bed without using bedrails?: A Little Help needed moving to and from a bed to a chair (including a wheelchair)?: A Little Help needed standing up from a chair using your arms (e.g., wheelchair or bedside chair)?: A Little Help needed to walk in hospital room?: A Little Help needed climbing 3-5 steps with a railing? : A Little 6 Click Score: 19    End of Session Equipment Utilized During Treatment: Gait belt Activity Tolerance: Patient tolerated treatment well Patient left: in bed;with call bell/phone within reach;with bed alarm set Nurse Communication: Mobility status PT Visit Diagnosis: Unsteadiness on feet (R26.81);Muscle weakness (generalized) (M62.81)    Time: 2725-3664 PT Time Calculation (min) (ACUTE ONLY): 21 min   Charges:   PT Evaluation $PT Eval Low Complexity: 1 Low  Cindee Salt, DPT  Acute Rehabilitation Services  Office: [phone]   Lehman Prom 03/28/2022, 2:52 PM

## 2022-03-28 NOTE — ED Notes (Signed)
Eddie Smith (Mother) called asking for an update. Her number is [phone].

## 2022-03-28 NOTE — Evaluation (Signed)
Speech Language Pathology Evaluation Patient Details Name: Eddie Smith MRN: [id number] DOB: 16-May-1981 Today's Date: 03/28/2022 Time: 3570-1779 SLP Time Calculation (min) (ACUTE ONLY): 17 min  Problem List:  Patient Active Problem List   Diagnosis Date Noted   CVA (cerebral vascular accident) (HCC) 03/27/2022   Atypical chest pain 03/27/2022   Acute encephalopathy 01/23/2022   PNA (pneumonia) 01/21/2022   Syncope 01/21/2022   Drug overdose 01/21/2022   Alcohol abuse 01/21/2022   Depression 01/21/2022   Polysubstance use disorder 01/21/2022   Tobacco abuse 01/21/2022   History of acute prostatitis 04/21/2012   Seasonal allergies 04/03/2012   Hypertension 04/03/2012   Hx of migraines 04/03/2012   Sleep apnea, obstructive 04/03/2012   GERD (gastroesophageal reflux disease)    Past Medical History:  Past Medical History:  Diagnosis Date   Alcohol abuse    Hypertension 04/03/2012   Prostatitis    Past Surgical History: History reviewed. No pertinent surgical history. HPI:  41 year old male with past medical history of depression, and polysubstance use disorder who presents to the emergency room for chest pain and was found to have left-sided weakness and blurry vision; MRI brain on 03/28/22 revealed Truncated MRI reveals small acute infarct in the Right  Cerebellum. No associated hemorrhage or mass effect.  2. Elsewhere negative limited MRI appearance of the brain; SLE generated.   Assessment / Plan / Recommendation Clinical Impression  Pt assessed via [employer] Mental Status Examination (SLUMS) with a score obtained of 18/30 with some subtests not attempted d/t pt refusal or stating "It makes my head hurt" when asked to attempt certain tasks.  A typical score on this assessment is 27/30.  He demonstrated difficulty with memory recall with objects with a time constraint and decreased sustained attention for simple calculation and digit recall backwards.  He  achieved 25% accuracy with retaining information from a short paragraph as well.  Performance may not be reliable as pt refused some items d/t internal distractions (ie: pain, anxiety), so this should be taken into consideration.  Prior cognitive functioning unable to be assessed as no family available to determine.  Speech was intelligible within conversation and OME unremarkable.  ST will f/u in acute setting for cognitive deficits and determine baseline functioning.  Thank you for this consult.    SLP Assessment  SLP Recommendation/Assessment: Patient needs continued Speech Lanaguage Pathology Services SLP Visit Diagnosis: Cognitive communication deficit (R41.841)    Recommendations for follow up therapy are one component of a multi-disciplinary discharge planning process, led by the attending physician.  Recommendations may be updated based on patient status, additional functional criteria and insurance authorization.    Follow Up Recommendations  Follow physician's recommendations for discharge plan and follow up therapies    Assistance Recommended at Discharge  Intermittent Supervision/Assistance  Functional Status Assessment Patient has had a recent decline in their functional status and demonstrates the ability to make significant improvements in function in a reasonable and predictable amount of time.  Frequency and Duration min 2x/week  1 week      SLP Evaluation Cognition  Overall Cognitive Status: Within Functional Limits for tasks assessed Arousal/Alertness: Awake/alert Orientation Level: Oriented X4 Year: 2023 Month: August Day of Week: Incorrect Attention: Sustained Sustained Attention: Impaired Sustained Attention Impairment: Verbal basic;Functional basic Memory: Impaired Memory Impairment: Retrieval deficit;Decreased recall of new information;Decreased short term memory Decreased Short Term Memory: Verbal basic;Functional basic Awareness: Appears intact Problem  Solving: Appears intact Behaviors: Other (comment) (anxious (min) with certain tasks) Safety/Judgment:  Appears intact       Comprehension  Auditory Comprehension Overall Auditory Comprehension: Impaired Yes/No Questions: Within Functional Limits Commands: Not tested Conversation: Simple Interfering Components: Attention;Working Radio broadcast assistant: Repetition Counsellor: Not tested Reading Comprehension Reading Status: Unable to assess (comment) (pt wears glasses; not available)    Expression Expression Primary Mode of Expression: Verbal Verbal Expression Overall Verbal Expression: Appears within functional limits for tasks assessed Initiation: No impairment Level of Generative/Spontaneous Verbalization: Conversation Repetition: No impairment Naming: No impairment Pragmatics: No impairment Non-Verbal Means of Communication: Not applicable Written Expression Dominant Hand: Right Written Expression: Not tested   Oral / Motor  Oral Motor/Sensory Function Overall Oral Motor/Sensory Function: Within functional limits Motor Speech Overall Motor Speech: Appears within functional limits for tasks assessed Respiration: Within functional limits Phonation: Normal Resonance: Within functional limits Articulation: Within functional limitis Intelligibility: Intelligible Motor Planning: Witnin functional limits Motor Speech Errors: Not applicable            Tressie Stalker 03/28/2022, 12:38 PM

## 2022-03-28 NOTE — Hospital Course (Addendum)
Pt endorses chest pain, and difficult moving L side of body. No nausea or vomiting. 7/10 chest pain, decreased from yesterday. Pain radiates to both sides of chest. Denies any heartburn. Had similar pain a month ago when he had pneumonia. No clear pattern on pain. Can't get a breath in and out when SOB comes on. Endorses having chils and sweats that started right before the chest pain, about 2 days ago. Clear pt is not at baseline. Reassured that his O2 is at 100%.   Did not feel weakness until he came to E and waiting area. Counseled on cocaine use. Discussed plan for PT/OT eval. Pt has medicaid, discussed potential SNF placement.Endorses talking like he normally does. Pain does not get worse with eating. Does not radiate to back.   Last vomiting episode was yesterday before he got here. Denies sick contacts, has not had a bowel movement. Last drug use was 3 days ago, symptom onset day after.   Pain in the middle of the back that is tender to palpation. Tenderness around epigastric region  L sided facial droop. Decreased sensation of L side of face and forehead. R rm can elevate much further than Left arm.   Improvement on physical exam  Unable to do finger to nose test on L side because can't elevate his arm enough  L leg barely able to be elevated  L foot unable to push down. Sensation decreasd in left leg  Sensation affected on L face, arm, leg    8/19: Pt has puddy

## 2022-03-28 NOTE — Progress Notes (Signed)
Subjective:   Pt endorses chest pain, and difficult moving L side of body. No nausea or vomiting. 7/10 chest pain, decreased from yesterday. Pain radiates to both sides of chest. Denies any heartburn. Had similar pain a month ago when he had pneumonia. No clear pattern on pain. Can't get a breath in and out when SOB comes on. Endorses having chils and sweats that started right before the chest pain, about 2 days ago. Clear pt is not at baseline. Reassured that his O2 is at 100%.   Did not feel weakness until he came to E and waiting area. Counseled on cocaine use. Discussed plan for PT/OT eval. Pt has medicaid, discussed potential SNF placement. Endorses talking like he normally does. Pain does not get worse with eating. Does not radiate to back.   Last vomiting episode was yesterday before he got here. Denies sick contacts, has not had a bowel movement. Last drug use was 3 days ago, symptom onset day after.   Objective:  Vital signs in last 24 hours: Vitals:   03/28/22 1330 03/28/22 1355 03/28/22 1533 03/28/22 1746  BP: 119/81 122/73 (!) 81/62 117/77  Pulse: 62 75 70 74  Resp: 13  19   Temp: (!) 97 F (36.1 C) 98.7 F (37.1 C) 98.6 F (37 C)   TempSrc: Axillary Oral Oral   SpO2: 98% 100% 97%   Weight:       Physical Exam: General: middle aged male, laying in bed, NAD. CV: normal rate and regular rhythm, no m/r/g. Chest: normal WOB on RA. CTABL, no adventitious sounds noted on exam. Does have tenderness to palpation around lateral chest bilaterally. Abdomen: soft, nondistended, mildly tender in periumbilical region. Normoactive bowel sounds. MSK: no peripheral edema noted. Skin: warm and dry. Neuro: AAOx3. blurry vision on left side. Diminished sensation on L side throughout. 4/5 strength LUE, 3-4/5 strength LLE, 5/5 RUE, 5/5 RLE. Finger to nose testing intact on right side, unable to perform on L side.  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 Think that small R cerebellar infarct is likely clinically silent. Obvious risk factors for patient are hypertension, ongoing cocaine use, tobacco use. LDL 64. A1c 5.2%. UDS positive for cocaine and amphetamines. ECHO with LVEF 60-65%, no RWMA, no valvular abnormalities, no atrial level shunt detected. -appreciate neurology recommendations -continue DAPT -PT/OT recommending HH PT/OT -continue counseling on tobacco and cocaine cessation  Atypical chest pain Lateral chest wall tenderness ACS ruled out, normal trops and unremarkable EKG. Likely cocaine-induced vasospasm. He is still experiencing some chest discomfort today, unclear if could be associated to vomiting episode (possible MW tear). Will provide GI cocktail today to assess for relief. ECHO with no abnormalities noted. Lateral chest wall tenderness likely soreness from repeated emesis prior to arrival.  -GI cocktail -telemetry -CTM  AUD -CIWA scores have been low (2 or less) -CTM for one more day, can likely DC tomorrow if remain low  Hematemesis - resolved Abdominal pain Unclear etiology of vomiting episodes but likely resulted in a MW tear. No longer having any N/V. Hgb stable. Has mild periumbilical pain on exam but tolerating PO intake. -zofran prn -GI cocktail -trend CBC  Polysubstance use disorder Depression Will need extensive counseling on tobacco and cocaine cessation, including any  possible resources. Placed TOC consult to help with this. Consider Patient and Family Services with [place] to help with this. Patient states that cocaine, alcohol, and tobacco use stems from depression from unemployment. Would benefit from depression screening and management to see if this may help prevent  substance use.  Prior to Admission Living Arrangement: home Anticipated Discharge Location: TBD Barriers to Discharge: continued medical management Dispo: Anticipated discharge in approximately 1-2 day(s).   Merrilyn Puma, MD 03/28/2022, 6:59 PM Pager: [phone] After 5pm on weekdays and 1pm on weekends: On Call pager [phone]

## 2022-03-28 NOTE — ED Notes (Signed)
Pt transported to 3W Pulmonary Progressive care on monitoring with belonging and NS infusing. Pt condom cath bag emptied and urinal emptied at this time.

## 2022-03-29 LAB — CBC
HCT: 48.3 % (ref 39.0–52.0)
Hemoglobin: 16.3 g/dL (ref 13.0–17.0)
MCH: 34 pg (ref 26.0–34.0)
MCHC: 33.7 g/dL (ref 30.0–36.0)
MCV: 100.6 fL — ABNORMAL HIGH (ref 80.0–100.0)
Platelets: 249 10*3/uL (ref 150–400)
RBC: 4.8 MIL/uL (ref 4.22–5.81)
RDW: 13.5 % (ref 11.5–15.5)
WBC: 7.4 10*3/uL (ref 4.0–10.5)
nRBC: 0 % (ref 0.0–0.2)

## 2022-03-29 LAB — BASIC METABOLIC PANEL
Anion gap: 7 (ref 5–15)
BUN: 11 mg/dL (ref 6–20)
CO2: 24 mmol/L (ref 22–32)
Calcium: 8.7 mg/dL — ABNORMAL LOW (ref 8.9–10.3)
Chloride: 104 mmol/L (ref 98–111)
Creatinine, Ser: 0.73 mg/dL (ref 0.61–1.24)
GFR, Estimated: >60 mL/min (ref >60)
Glucose, Bld: 93 mg/dL (ref 70–99)
Potassium: 4.2 mmol/L (ref 3.5–5.1)
Sodium: 135 mmol/L (ref 135–145)

## 2022-03-29 LAB — GLUCOSE, CAPILLARY: Glucose-Capillary: 99 mg/dL (ref 70–99)

## 2022-03-29 MED ORDER — LIDOCAINE 5 % EX PTCH
1.0000 | MEDICATED_PATCH | CUTANEOUS | Status: DC
  Administered 2022-03-29: 1 via TRANSDERMAL
  Filled 2022-03-29: qty 1

## 2022-03-29 NOTE — TOC Initial Note (Addendum)
Transition of Care [facility]) - Initial/Assessment Note    Patient Details  Name: Eddie Smith MRN: [id number] Date of Birth: [birth year]  [facility]) CM/SW Contact:    Kermit Balo, RN Phone Number: 03/29/2022, 2:59 PM  Clinical Narrative:                 Pt is currently staying in [place]. He is interested in drug rehab post hospitalization. With his permission CM spoke to patient's mother and she asked to see if [facility] [phone] ext [phone] could accept the patient. Cm has reached out to [facility] and provided them the information requested. Fax: [phone]. [facility] needs pt to change his medicaid from [place] to [place]. CM provided him the number to call to see if able to switch. [facility] wont have a spot if he gets medicaid switched until Tuesday. CM has updated the MD and pt.  Walker for home to be delivered to the room per [employer].  TOC following.    Barriers to Discharge: Continued Medical Work up   Patient Goals and CMS Choice        Expected Discharge Plan and Services     Discharge Planning Services: CM Consult                     DME Arranged: [employer] youth DME Agency: [employer] Date DME Agency Contacted: 03/29/22   Representative spoke with at DME Agency: lacretia            Prior Living Arrangements/Services     Patient language and need for interpreter reviewed:: Yes Do you feel safe going back to the place where you live?: Yes            Criminal Activity/Legal Involvement Pertinent to Current Situation/Hospitalization: No - Comment as needed  Activities of Daily Living      Permission Sought/Granted                  Emotional Assessment Appearance:: Appears stated age Attitude/Demeanor/Rapport: Engaged Affect (typically observed): Accepting Orientation: : Oriented to Self, Oriented to Place, Oriented to  Time, Oriented to Situation Alcohol / Substance Use: Illicit Drugs Psych Involvement: No  (comment)  Admission diagnosis:  CVA (cerebral vascular accident) (HCC) [I63.9] 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) [I63.441] Cocaine use [F14.90] Chest pain, unspecified type [R07.9] Patient Active Problem List   Diagnosis Date Noted   CVA (cerebral vascular accident) (HCC) 03/27/2022   Atypical chest pain 03/27/2022   Acute encephalopathy 01/23/2022   PNA (pneumonia) 01/21/2022   Syncope 01/21/2022   Drug overdose 01/21/2022   Alcohol abuse 01/21/2022   Depression 01/21/2022   Polysubstance use disorder 01/21/2022   Tobacco abuse 01/21/2022   History of acute prostatitis 04/21/2012   Seasonal allergies 04/03/2012   Hypertension 04/03/2012   Hx of migraines 04/03/2012   Sleep apnea, obstructive 04/03/2012   GERD (gastroesophageal reflux disease)    PCP:  Pcp, No Pharmacy:   [id number] - [place]), [place] - [address] [place] ([place]) [place] [address] Phone: [phone] Fax: [phone]  [id number] - [place], [place] - [address] [place] [address] Phone: [phone] Fax: [phone]  [facility] [address] [place] [address] Phone: [phone] Fax: [phone]  [facility] [address] [place] [address] Phone: [phone] Fax: [phone]     Social Determinants of Health (SDOH) Interventions  Readmission Risk Interventions     No data to display

## 2022-03-29 NOTE — Progress Notes (Signed)
Patient's mother Dorothy/ [phone]  called to unit to discuss information that she stated was requested by the doctors rounding.  She provided options for treatment facilities-- [facility] and [facility]. Residential Treatment facility [phone].  I notified Kelli/RN Case Manager of above information and she stated she would be in touch with mother.

## 2022-03-29 NOTE — TOC CAGE-AID Note (Signed)
Transition of Care [facility]) - CAGE-AID Screening   Patient Details  Name: Eddie Smith MRN: [id number] Date of Birth: [birth year]  [facility] CM/SW Contact:    Kermit Balo, RN Phone Number: 03/29/2022, 3:07 PM   Clinical Narrative: Attempting to get pt into [facility]. Pt has other resources for inpatient/ outpatient drug counseling.   CAGE-AID Screening:    Have You Ever Felt You Ought to Cut Down on Your Drinking or Drug Use?: Yes Have People Annoyed You By Critizing Your Drinking Or Drug Use?: Yes Have You Felt Bad Or Guilty About Your Drinking Or Drug Use?: Yes Have You Ever Had a Drink or Used Drugs First Thing In The Morning to Steady Your Nerves or to Get Rid of a Hangover?: No CAGE-AID Score: 3  Substance Abuse Education Offered: Yes

## 2022-03-29 NOTE — Progress Notes (Signed)
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  Patient also has left hemiparesis and sensory loss possibly from small right subcortical infarct not visualized on MRI   OT comments  Pt able to ambulate with RW, toilet and wash hands at sink with min guard assist. Educated in L UE HEP, A/AROM shoulder, AROM elbow, yellow theraputty exercises for hand. Pt returned demonstration.   Recommendations for follow up therapy are one component of a multi-disciplinary discharge planning process, led by the attending physician.  Recommendations may be updated based on patient status, additional functional criteria and insurance authorization.    Follow Up Recommendations  Home health OT    Assistance Recommended at Discharge Frequent or constant Supervision/Assistance  Patient can return home with the following  A little help with walking and/or transfers;A little help with bathing/dressing/bathroom;Assistance with cooking/housework;Assist for transportation;Help with stairs or ramp for entrance   Equipment Recommendations  Tub/shower seat    Recommendations for Other Services      Precautions / Restrictions Precautions Precautions: Fall Precaution Comments: left side weakness       Mobility Bed Mobility Overal bed mobility: Needs Assistance Bed Mobility: Supine to Sit, Sit to Supine     Supine to sit: Supervision, HOB elevated Sit to supine: Supervision, HOB elevated   General bed mobility comments: no assist, supervision for lines    Transfers Overall transfer level: Needs assistance Equipment used: Rolling walker (2 wheels) Transfers: Sit to/from Stand Sit to Stand: Min guard           General transfer comment: cues for technique      Balance Overall balance assessment: Needs assistance   Sitting balance-Leahy Scale: Good       Standing balance-Leahy Scale: Fair Standing balance comment: at sink, RW for ambulation                           ADL either performed or assessed with clinical judgement   ADL Overall ADL's : Needs assistance/impaired     Grooming: Wash/dry hands;Standing;Min guard           Upper Body Dressing : Set up;Sitting       Toilet Transfer: Min guard;Ambulation;Rolling walker (2 wheels)   Toileting- Clothing Manipulation and Hygiene: Min guard;Sit to/from stand       Functional mobility during ADLs: Min guard;Rolling walker (2 wheels)      Extremity/Trunk Assessment              Vision       Perception     Praxis      Cognition Arousal/Alertness: Awake/alert Behavior During Therapy: Flat affect Overall Cognitive Status: Within Functional Limits for tasks assessed                                          Exercises Exercises: General Upper Extremity, Hand exercises General Exercises - Upper Extremity Shoulder Flexion: AAROM, Left, 10 reps, Seated Elbow Flexion: AROM, Left, 10 reps, Seated Elbow Extension: AROM, Left, 10 reps, Seated Wrist Extension: AROM, 10 reps, Seated, Left Digit Composite Flexion: Strengthening, Left, 5 reps, Seated, Other (comment) (yellow theraputty) Composite Extension: Strengthening, Left, 10 reps, Seated, Other (comment) (yellow  theraputty)    Shoulder Instructions       General Comments      Pertinent Vitals/ Pain       Pain Assessment Pain Assessment: Faces Faces Pain Scale: Hurts a little bit Pain Location: L hand Pain Descriptors / Indicators: Pins and needles Pain Intervention(s): Monitored during session  Home Living                                          Prior Functioning/Environment              Frequency  Min 2X/week        Progress Toward Goals  OT  Goals(current goals can now be found in the care plan section)  Progress towards OT goals: Progressing toward goals  Acute Rehab OT Goals OT Goal Formulation: With patient Time For Goal Achievement: 04/11/22 Potential to Achieve Goals: Good  Plan Discharge plan remains appropriate    Co-evaluation                 AM-PAC OT "6 Clicks" Daily Activity     Outcome Measure   Help from another person eating meals?: None Help from another person taking care of personal grooming?: A Little Help from another person toileting, which includes using toliet, bedpan, or urinal?: A Little Help from another person bathing (including washing, rinsing, drying)?: A Little Help from another person to put on and taking off regular upper body clothing?: None Help from another person to put on and taking off regular lower body clothing?: A Little 6 Click Score: 20    End of Session    OT Visit Diagnosis: Unsteadiness on feet (R26.81);Muscle weakness (generalized) (M62.81);Hemiplegia and hemiparesis Hemiplegia - Right/Left: Left Hemiplegia - dominant/non-dominant: Non-Dominant Hemiplegia - caused by: Cerebral infarction   Activity Tolerance Patient tolerated treatment well   Patient Left in bed;with call bell/phone within reach   Nurse Communication          Time: 1400-1420 OT Time Calculation (min): 20 min  Charges: OT General Charges $OT Visit: 1 Visit OT Treatments $Neuromuscular Re-education: 8-22 mins  Eddie Smith, OTR/L Acute Rehabilitation Services Office: [phone]   Eddie Smith 03/29/2022, 4:21 PM

## 2022-03-29 NOTE — Progress Notes (Signed)
Physical Therapy Treatment Patient Details Name: Eddie Smith MRN: [id number] DOB: February 13, 1981 Today's Date: 03/29/2022   History of Present Illness Mr. 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  Patient also has left hemiparesis and sensory loss possibly from small right subcortical infarct not visualized on MRI    PT Comments    Pt required supervision bed mobility, min guard assist transfers, and min guard assist amb 30' with RW. Decreased strength noted LUE/LE. No knee buckling during gait. Pt reporting pins and needles feeling L hand and foot. Pt returned to bed at end of session. Youth RW provided in room.    Recommendations for follow up therapy are one component of a multi-disciplinary discharge planning process, led by the attending physician.  Recommendations may be updated based on patient status, additional functional criteria and insurance authorization.  Follow Up Recommendations  Home health PT     Assistance Recommended at Discharge Intermittent Supervision/Assistance  Patient can return home with the following Assistance with cooking/housework;Assist for transportation   Equipment Recommendations  Rolling walker (2 wheels) (youth)    Recommendations for Other Services       Precautions / Restrictions Precautions Precautions: Fall Precaution Comments: left side weakness     Mobility  Bed Mobility Overal bed mobility: Needs Assistance Bed Mobility: Supine to Sit, Sit to Supine     Supine to sit: Supervision, HOB elevated Sit to supine: Supervision, HOB elevated   General bed mobility comments: +rail, increased time    Transfers Overall transfer level: Needs assistance Equipment used: Rolling walker (2 wheels) Transfers: Sit to/from Stand Sit to Stand: Min guard           General transfer comment: increased time to power up    Ambulation/Gait Ambulation/Gait  assistance: Min guard Gait Distance (Feet): 30 Feet Assistive device: Rolling walker (2 wheels) Gait Pattern/deviations: Step-through pattern, Decreased stride length, Decreased weight shift to left Gait velocity: decreased     General Gait Details: slow, cautious gait   Stairs             Wheelchair Mobility    Modified Rankin (Stroke Patients Only) Modified Rankin (Stroke Patients Only) Pre-Morbid Rankin Score: No symptoms Modified Rankin: Moderately severe disability     Balance Overall balance assessment: Needs assistance Sitting-balance support: Feet unsupported, No upper extremity supported Sitting balance-Leahy Scale: Good     Standing balance support: Bilateral upper extremity supported, No upper extremity supported, During functional activity Standing balance-Leahy Scale: Fair Standing balance comment: RW for amb                            Cognition Arousal/Alertness: Awake/alert Behavior During Therapy: WFL for tasks assessed/performed Overall Cognitive Status: Within Functional Limits for tasks assessed                                          Exercises      General Comments        Pertinent Vitals/Pain Pain Assessment Pain Assessment: Faces Faces Pain Scale: Hurts a little bit Pain Location: chest (from extending episode of vomiting) Pain Descriptors / Indicators: Discomfort Pain Intervention(s): Monitored during session    Home Living  Prior Function            PT Goals (current goals can now be found in the care plan section) Acute Rehab PT Goals Patient Stated Goal: to be independent Progress towards PT goals: Progressing toward goals    Frequency    Min 4X/week      PT Plan Current plan remains appropriate    Co-evaluation              AM-PAC PT "6 Clicks" Mobility   Outcome Measure  Help needed turning from your back to your side while in a flat  bed without using bedrails?: None Help needed moving from lying on your back to sitting on the side of a flat bed without using bedrails?: A Little Help needed moving to and from a bed to a chair (including a wheelchair)?: A Little Help needed standing up from a chair using your arms (e.g., wheelchair or bedside chair)?: A Little Help needed to walk in hospital room?: A Little Help needed climbing 3-5 steps with a railing? : A Little 6 Click Score: 19    End of Session Equipment Utilized During Treatment: Gait belt Activity Tolerance: Patient tolerated treatment well Patient left: in bed;with call bell/phone within reach;with bed alarm set Nurse Communication: Mobility status PT Visit Diagnosis: Unsteadiness on feet (R26.81);Muscle weakness (generalized) (M62.81)     Time: 0539-7673 PT Time Calculation (min) (ACUTE ONLY): 24 min  Charges:  $Gait Training: 23-37 mins                     Aida Raider, [place]  Office # [phone] Pager [phone]    Ilda Foil 03/29/2022, 12:43 PM

## 2022-03-29 NOTE — Progress Notes (Signed)
NAME:  Steadman Prosperi, MRN:  [id number], DOB:  03/09/1981, LOS: 2 ADMISSION DATE:  03/27/2022  Subjective  Patient evaluated at bedside this AM. Reports slight improvement of symptoms from yesterday. Still has some ongoing chest pain on the side. Mentions he is able to move his left side better today. Would like to discharge to rehabilitation facility for substance use. His mother will be providing information to the nursing staff.   Objective   Blood pressure 111/74, pulse 60, temperature 98.1 F (36.7 C), temperature source Oral, resp. rate 16, weight 63.5 kg, SpO2 100 %.     Intake/Output Summary (Last 24 hours) at 03/29/2022 1322 Last data filed at 03/29/2022 0600 Gross per 24 hour  Intake 3080.52 ml  Output 2750 ml  Net 330.52 ml   Filed Weights   03/27/22 0323 03/29/22 0600  Weight: 63 kg 63.5 kg   Physical Exam: General: Resting in bed comfortably, no acute distress CV: Regular rate, rhythm. No murmurs appreciated. Warm extremities Pulm: Normal work of breathing on room air Neuro: Awake, alert, conversing appropriately. Mildly decreased sensation on L side of body. Strength 5/5 RUE, RLE, 3/5 LUE, LLE. Other cranial nerves in tact. Psych: Normal mood, affect, speech.  Labs       Latest Ref Rng & Units 03/29/2022    3:12 AM 03/28/2022    2:47 AM 03/27/2022    6:23 AM  CBC  WBC 4.0 - 10.5 K/uL 7.4  6.3    Hemoglobin 13.0 - 17.0 g/dL 46.2  70.3  50.0   Hematocrit 39.0 - 52.0 % 48.3  47.8  52.0   Platelets 150 - 400 K/uL 249  235        Latest Ref Rng & Units 03/29/2022    3:12 AM 03/28/2022    2:47 AM 03/27/2022    6:23 AM  BMP  Glucose 70 - 99 mg/dL 93  97  938   BUN 6 - 20 mg/dL 11  6  6    Creatinine 0.61 - 1.24 mg/dL  1.82  9.93   Sodium 135 - 145 mmol/L 135  134  133   Potassium 3.5 - 5.1 mmol/L 4.2  4.4  3.4   Chloride 98 - 111 mmol/L 104  103  95   CO2 22 - 32 mmol/L 24  26    Calcium 8.9 - 10.3 mg/dL 8.7  8.6      Summary  Ewan Whittley is  41yo person with polysubstance use, depression admitted 8/16 with acute cerebellar CVA, although symptoms primarily thought to be 2/2 cocaine vasculopathy.   Assessment & Plan:  Principal Problem:   CVA (cerebral vascular accident) (HCC) Active Problems:   Depression   Polysubstance use disorder   Atypical chest pain  #Acute R cerebellar infarct #Cocaine vasculopathy This morning, patient's strength improved on the left side, able to lift against gravity in both lower and upper extremities. LDL, A1c at goal, no new medications needed at this time. Echo without structural heart disease. Will continue with DAPT x3 weeks along with tobacco/cocaine cessation. - Aspirin 81mg  daily - Plavix 75mg  daily x3 weeks - Continue with HH PT/OT - Tobacco, cocaine cessation  #Polysubstance use Likely contributing to his stroke and symptoms. He reports that he is motivated to quit using recreational drugs and is planning for an inpatient rehabilitation center. Unfortunately, this likely will prove difficult given he needs physical therapy needs and is using a walker. Social work and our team will reach out to  possible facilities to determine best disposition plan. - Continue tobacco, cocaine cessation discussions - Follow-up with potential facilities ([facility], [facility])  #Atypical chest pain #Chest wall tenderness  Most likely musculoskeletal/GI in nature. ACS ruled out on admission - no new or changing symptoms today. We will add lidocaine patch today to see if this helps. - Add lidocaine patch today - GI cocktail as needed for reflux symptoms  Best practice:  DIET: HH IVF: n/a DVT PPX: lovenox BOWEL: n/a CODE: FULL FAM COM: n/a  Evlyn Kanner, MD Internal Medicine Resident PGY-3 PAGER: [phone] 03/29/2022 1:22 PM  If after hours (below), please contact on-call pager: [phone] 5PM-7AM Monday-Friday 1PM-7AM Saturday-Sunday

## 2022-03-30 DIAGNOSIS — I639 Cerebral infarction, unspecified: Secondary | ICD-10-CM | POA: Diagnosis not present

## 2022-03-30 DIAGNOSIS — F1910 Other psychoactive substance abuse, uncomplicated: Secondary | ICD-10-CM | POA: Diagnosis not present

## 2022-03-30 LAB — GLUCOSE, CAPILLARY: Glucose-Capillary: 104 mg/dL — ABNORMAL HIGH (ref 70–99)

## 2022-03-30 NOTE — Plan of Care (Signed)
°  Problem: Education: °Goal: Knowledge of General Education information will improve °Description: Including pain rating scale, medication(s)/side effects and non-pharmacologic comfort measures °Outcome: Progressing °  °Problem: Clinical Measurements: °Goal: Will remain free from infection °Outcome: Progressing °Goal: Respiratory complications will improve °Outcome: Progressing °Goal: Cardiovascular complication will be avoided °Outcome: Progressing °  °Problem: Nutrition: °Goal: Adequate nutrition will be maintained °Outcome: Progressing °  °Problem: Coping: °Goal: Level of anxiety will decrease °Outcome: Progressing °  °Problem: Elimination: °Goal: Will not experience complications related to urinary retention °Outcome: Progressing °  °Problem: Pain Managment: °Goal: General experience of comfort will improve °Outcome: Progressing °  °Problem: Safety: °Goal: Ability to remain free from injury will improve °Outcome: Progressing °  °Problem: Skin Integrity: °Goal: Risk for impaired skin integrity will decrease °Outcome: Progressing °  °

## 2022-03-30 NOTE — Progress Notes (Signed)
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  Patient also has left hemiparesis and sensory loss possibly from small right subcortical infarct not visualized on MRI. PMH significant for HTN and prostatitis.   OT comments  Pt showered and dressed with set up to supervision. Min guard assist for shower transfer, sat on BSC throughout. Using L UE effectively throughout ADLs, did not need to use compensatory strategies.    Recommendations for follow up therapy are one component of a multi-disciplinary discharge planning process, led by the attending physician.  Recommendations may be updated based on patient status, additional functional criteria and insurance authorization.    Follow Up Recommendations  Other (comment) (Plans to ge to drug and alcohol rehab.)    Assistance Recommended at Discharge PRN  Patient can return home with the following  A little help with walking and/or transfers;A little help with bathing/dressing/bathroom;Assistance with cooking/housework;Assist for transportation;Help with stairs or ramp for entrance   Equipment Recommendations  Tub/shower seat    Recommendations for Other Services      Precautions / Restrictions Precautions Precautions: Fall Precaution Comments: left side weakness Restrictions Weight Bearing Restrictions: No       Mobility Bed Mobility               General bed mobility comments: seated EOB, left up in chair    Transfers Overall transfer level: Needs assistance Equipment used: Rolling walker (2 wheels) Transfers: Sit to/from Stand Sit to Stand: Supervision                 Balance   Sitting-balance support: Feet unsupported, No upper extremity supported Sitting balance-Leahy Scale:  Normal     Standing balance support: Bilateral upper extremity supported, During functional activity, Reliant on assistive device for balance Standing balance-Leahy Scale: Poor                             ADL either performed or assessed with clinical judgement   ADL Overall ADL's : Needs assistance/impaired         Upper Body Bathing: Modified independent;Sitting   Lower Body Bathing: Modified independent;Sitting/lateral leans   Upper Body Dressing : Set up;Sitting   Lower Body Dressing: Supervision/safety;Sit to/from stand           Tub/ Shower Transfer: Walk-in shower;BSC/3in1;Rolling walker (2 wheels);Min guard   Functional mobility during ADLs: Supervision/safety;Rolling walker (2 wheels)      Extremity/Trunk Assessment              Vision       Perception     Praxis      Cognition Arousal/Alertness: Awake/alert Behavior During Therapy: Impulsive Overall Cognitive Status: Within Functional Limits for tasks assessed                                          Exercises      Shoulder Instructions       General Comments      Pertinent Vitals/ Pain       Pain Assessment Pain Assessment: No/denies pain  Home Living  Prior Functioning/Environment              Frequency  Min 2X/week        Progress Toward Goals  OT Goals(current goals can now be found in the care plan section)  Progress towards OT goals: Progressing toward goals  Acute Rehab OT Goals OT Goal Formulation: With patient Time For Goal Achievement: 04/11/22 Potential to Achieve Goals: Good  Plan      Co-evaluation                 AM-PAC OT "6 Clicks" Daily Activity     Outcome Measure   Help from another person eating meals?: None Help from another person taking care of personal grooming?: A Little Help from another person toileting, which includes using toliet,  bedpan, or urinal?: A Little Help from another person bathing (including washing, rinsing, drying)?: None Help from another person to put on and taking off regular upper body clothing?: None Help from another person to put on and taking off regular lower body clothing?: None 6 Click Score: 22    End of Session Equipment Utilized During Treatment: Rolling walker (2 wheels)  OT Visit Diagnosis: Unsteadiness on feet (R26.81);Hemiplegia and hemiparesis Hemiplegia - Right/Left: Left Hemiplegia - dominant/non-dominant: Non-Dominant Hemiplegia - caused by: Cerebral infarction   Activity Tolerance Patient tolerated treatment well   Patient Left in chair;with call bell/phone within reach   Nurse Communication Mobility status;Other (comment) (ok to take off tele for shower)        Time: 9024-0973 OT Time Calculation (min): 43 min  Charges: OT General Charges $OT Visit: 1 Visit OT Treatments $Self Care/Home Management : 38-52 mins  Berna Spare, OTR/L Acute Rehabilitation Services Office: [phone]   Evern Bio 03/30/2022, 2:35 PM

## 2022-03-30 NOTE — Progress Notes (Signed)
NAME:  Shana Younge, MRN:  [id number], DOB:  05-28-1981, LOS: 3 ADMISSION DATE:  03/27/2022  Subjective  Patient evaluated at bedside this AM. Reports he is doing well, physical therapy worked with him yesterday. Has been working on his left hand/arm strength while in the hospital. He is motivated to be discharged to inpatient rehabilitation still. Discussed that the facility could accommodate a walker, but not physical therapy. We explored a few different options to come up with the best disposition plan.   Objective   Blood pressure 91/65, pulse 77, temperature 98.4 F (36.9 C), temperature source Oral, resp. rate 17, weight 65 kg, SpO2 97 %.     Intake/Output Summary (Last 24 hours) at 03/30/2022 0901 Last data filed at 03/29/2022 2100 Gross per 24 hour  Intake 474 ml  Output 900 ml  Net -426 ml   Filed Weights   03/29/22 0600 03/30/22 0458 03/30/22 0526  Weight: 63.5 kg 65.4 kg 65 kg   Physical Exam: General: Resting comfortably in no acute distress Pulm: Normal work of breathing on room air.  Neuro: Awake, alert, conversing appropriately. Strength 5/5 RUE, RLE; 4/5 LUE, LLE; mild decrease in sensation throughout left hemibody. Psych: Normal mood, affect, speech  Labs       Latest Ref Rng & Units 03/29/2022    3:12 AM 03/28/2022    2:47 AM 03/27/2022    6:23 AM  CBC  WBC 4.0 - 10.5 K/uL 7.4  6.3    Hemoglobin 13.0 - 17.0 g/dL 08.6  57.8  46.9   Hematocrit 39.0 - 52.0 % 48.3  47.8  52.0   Platelets 150 - 400 K/uL 249  235        Latest Ref Rng & Units 03/29/2022    3:12 AM 03/28/2022    2:47 AM 03/27/2022    6:23 AM  BMP  Glucose 70 - 99 mg/dL 93  97  629   BUN 6 - 20 mg/dL 11  6  6    Creatinine 0.61 - 1.24 mg/dL  5.28  4.13   Sodium 135 - 145 mmol/L 135  134  133   Potassium 3.5 - 5.1 mmol/L 4.2  4.4  3.4   Chloride 98 - 111 mmol/L 104  103  95   CO2 22 - 32 mmol/L 24  26    Calcium 8.9 - 10.3 mg/dL 8.7  8.6      Summary  Kiante Emmerich is 41yo  person with polysubstance use, depression admitted 8/16 with acute cerebellar CVA, although symptoms primarily thought to be 2/2 cocaine vasculopathy, medically stable pending disposition.  Assessment & Plan:  Principal Problem:   CVA (cerebral vascular accident) (HCC) Active Problems:   Depression   Polysubstance use disorder   Atypical chest pain  #Acute R cerebellar infarct #Cocaine vasculopathy Patient's strength continues to improve on the left side, reporting he continues to work on his exercises while in the bed. Continue with current medications. - Aspirin 81mg  daily - Clopidogrel 75mg  daily x3wk - Tobacco, cocaine cessation - Continue PT/OT  #Polysubstance use Patient continues to be very motivated to be discharged to inpatient drug and alcohol rehabilitation center. Discussed with CSW yesterday, patient's preferred facility is [facility] in [facility], which can accommodate his walker. However, physical therapy services are not available there. Will reach out to our physical therapists today to see if we can give Mr. Rison some exercises to do by himself while he is there. Earliest possible discharge would be  Tuesday. - Continue tobacco, cocaine cessations - Planning to discharge to [facility]  Best practice:  DIET: HH IVF: n/a DVT PPX: lovenox BOWEL: n/a CODE: FULL FAM COM: n/a  Evlyn Kanner, MD Internal Medicine Resident PGY-3 PAGER: [phone] 03/30/2022 9:01 AM  If after hours (below), please contact on-call pager: [phone] 5PM-7AM Monday-Friday 1PM-7AM Saturday-Sunday

## 2022-03-30 NOTE — Progress Notes (Signed)
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  Patient also has left hemiparesis and sensory loss possibly from small right subcortical infarct not visualized on MRI. PMH significant for HTN and prostatitis.    PT Comments    Pt progressing towards physical therapy goals. Motivated to use LUE throughout session and overall demonstrated a good rehab effort. Pt was instructed in basic LE exercise program and yellow theraband provided. Will continue to follow and progress as able per POC.    Recommendations for follow up therapy are one component of a multi-disciplinary discharge planning process, led by the attending physician.  Recommendations may be updated based on patient status, additional functional criteria and insurance authorization.  Follow Up Recommendations  Home health PT     Assistance Recommended at Discharge Intermittent Supervision/Assistance  Patient can return home with the following Assistance with cooking/housework;Assist for transportation   Equipment Recommendations  Rolling walker (2 wheels) (youth)    Recommendations for Other Services       Precautions / Restrictions Precautions Precautions: Fall Precaution Comments: left side weakness Restrictions Weight Bearing Restrictions: No     Mobility  Bed Mobility Overal bed mobility: Needs Assistance Bed Mobility: Supine to Sit, Sit to Supine     Supine to sit: HOB elevated, Modified independent (Device/Increase time) Sit to supine: Supervision, HOB elevated   General bed mobility comments: Increased time but no assist required. Effortful to return to supine and position.    Transfers Overall transfer level: Needs assistance Equipment used: Rolling walker (2  wheels) Transfers: Sit to/from Stand Sit to Stand: Supervision           General transfer comment: Pt demonstrating good hand placement on seated surface for safety. No assist required however supervision provided throughout.    Ambulation/Gait Ambulation/Gait assistance: Min guard Gait Distance (Feet): 200 Feet Assistive device: Rolling walker (2 wheels) Gait Pattern/deviations: Step-through pattern, Decreased stride length, Decreased weight shift to left, Decreased dorsiflexion - left, Trunk flexed Gait velocity: decreased     General Gait Details: Slow and guarded but fairly steady with the RW for support. Able to make corrective changes with cues and demonstrating improved step-through gait pattern. Pt complaining of tightness in lower leg as he walks.   Stairs             Wheelchair Mobility    Modified Rankin (Stroke Patients Only) Modified Rankin (Stroke Patients Only) Pre-Morbid Rankin Score: No symptoms Modified Rankin: Moderately severe disability     Balance Overall balance assessment: Needs assistance Sitting-balance support: Feet unsupported, No upper extremity supported Sitting balance-Leahy Scale: Good     Standing balance support: Bilateral upper extremity supported, During functional activity, Reliant on assistive device for balance Standing balance-Leahy Scale: Poor                              Cognition Arousal/Alertness: Awake/alert Behavior During Therapy: WFL for tasks assessed/performed Overall Cognitive Status: Within Functional Limits for tasks assessed                                          Exercises Other Exercises Other Exercises: yellow theraband provided and pt was instructed  in using it for ankle strengthening a (DF/PF) and hip strengthening (leg press holding band in hand, and tied to bed rails - hip abd/add)    General Comments        Pertinent Vitals/Pain Pain Assessment Pain Assessment:  Faces Faces Pain Scale: Hurts a little bit Pain Location: L foot/heel (N/T) and shin (anterior tib distribution - tight/cramping). Pain Descriptors / Indicators: Pins and needles, Numbness, Tingling, Tightness, Cramping Pain Intervention(s): Limited activity within patient's tolerance, Monitored during session, Repositioned    Home Living                          Prior Function            PT Goals (current goals can now be found in the care plan section) Acute Rehab PT Goals Patient Stated Goal: to be independent again PT Goal Formulation: With patient Time For Goal Achievement: 04/11/22 Potential to Achieve Goals: Good Progress towards PT goals: Progressing toward goals    Frequency    Min 4X/week      PT Plan Current plan remains appropriate    Co-evaluation              AM-PAC PT "6 Clicks" Mobility   Outcome Measure  Help needed turning from your back to your side while in a flat bed without using bedrails?: None Help needed moving from lying on your back to sitting on the side of a flat bed without using bedrails?: A Little Help needed moving to and from a bed to a chair (including a wheelchair)?: A Little Help needed standing up from a chair using your arms (e.g., wheelchair or bedside chair)?: A Little Help needed to walk in hospital room?: A Little Help needed climbing 3-5 steps with a railing? : A Little 6 Click Score: 19    End of Session Equipment Utilized During Treatment: Gait belt Activity Tolerance: Patient tolerated treatment well Patient left: in bed;with call bell/phone within reach;with bed alarm set Nurse Communication: Mobility status PT Visit Diagnosis: Unsteadiness on feet (R26.81);Muscle weakness (generalized) (M62.81)     Time: 5732-2025 PT Time Calculation (min) (ACUTE ONLY): 30 min  Charges:  $Gait Training: 23-37 mins                     Conni Slipper, PT, DPT Acute Rehabilitation Services Secure Chat  Preferred Office: [phone]    Marylynn Pearson 03/30/2022, 12:58 PM

## 2022-03-31 DIAGNOSIS — I639 Cerebral infarction, unspecified: Secondary | ICD-10-CM | POA: Diagnosis not present

## 2022-03-31 DIAGNOSIS — F1990 Other psychoactive substance use, unspecified, uncomplicated: Secondary | ICD-10-CM

## 2022-03-31 DIAGNOSIS — F1490 Cocaine use, unspecified, uncomplicated: Secondary | ICD-10-CM

## 2022-03-31 DIAGNOSIS — I63441 Cerebral infarction due to embolism of right cerebellar artery: Secondary | ICD-10-CM

## 2022-03-31 DIAGNOSIS — M318 Other specified necrotizing vasculopathies: Secondary | ICD-10-CM | POA: Diagnosis not present

## 2022-03-31 MED ORDER — ORAL CARE MOUTH RINSE: 15.0000 mL | OROMUCOSAL | Status: DC | PRN

## 2022-03-31 MED ORDER — SENNOSIDES-DOCUSATE SODIUM 8.6-50 MG PO TABS
2.0000 | ORAL_TABLET | Freq: Every evening | ORAL | Status: DC | PRN
  Administered 2022-03-31: 2 via ORAL
  Filled 2022-03-31: qty 2

## 2022-03-31 NOTE — Progress Notes (Signed)
   NAME:  Eddie Smith, MRN:  [id number], DOB:  08-02-1981, LOS: 4 ADMISSION DATE:  03/27/2022  Subjective  Patient evaluated at bedside this AM. Reports continued improvement of symptoms. Demonstrated walking around the room with walker. Discussed plan for discharge on Tuesday to inpatient rehabilitation center in [facility].  Objective   Blood pressure 91/64, pulse 81, temperature 97.8 F (36.6 C), temperature source Oral, resp. rate 15, weight 64.7 kg, SpO2 100 %.     Intake/Output Summary (Last 24 hours) at 03/31/2022 1032 Last data filed at 03/31/2022 0600 Gross per 24 hour  Intake --  Output 1425 ml  Net -1425 ml   Filed Weights   03/30/22 0458 03/30/22 0526 03/31/22 0500  Weight: 65.4 kg 65 kg 64.7 kg   Physical Exam: General: Well-appearing person in bed, no acute distress Pulm: Normal work of breathing on room air. Neuro: Awake, alert, conversing appropriately. Gait appropriate with walker, having some weakness still in his left lower extremity.    Labs       Latest Ref Rng & Units 03/29/2022    3:12 AM 03/28/2022    2:47 AM 03/27/2022    6:23 AM  CBC  WBC 4.0 - 10.5 K/uL 7.4  6.3    Hemoglobin 13.0 - 17.0 g/dL 21.9  75.8  83.2   Hematocrit 39.0 - 52.0 % 48.3  47.8  52.0   Platelets 150 - 400 K/uL 249  235        Latest Ref Rng & Units 03/29/2022    3:12 AM 03/28/2022    2:47 AM 03/27/2022    6:23 AM  BMP  Glucose 70 - 99 mg/dL 93  97  549   BUN 6 - 20 mg/dL 11  6  6    Creatinine 0.61 - 1.24 mg/dL  8.26  4.15   Sodium 135 - 145 mmol/L 135  134  133   Potassium 3.5 - 5.1 mmol/L 4.2  4.4  3.4   Chloride 98 - 111 mmol/L 104  103  95   CO2 22 - 32 mmol/L 24  26    Calcium 8.9 - 10.3 mg/dL 8.7  8.6      Summary  Eddie Smith is 41yo person with polysubstance use, depression admitted 8/16 with acute right cerebellar CVA, although symptoms primarily thought to be 2/2 cocaine vasculopathy, medically stable pending disposition to inpatient drug and  alcohol rehabilitation facility.  Assessment & Plan:  Principal Problem:   CVA (cerebral vascular accident) [facility]) Active Problems:   Depression   Polysubstance use disorder   Atypical chest pain  #Acute right cerebellar infarct #Cocaine vasculopathy #Polysubstance use Medically stable, continues to progress with physical therapy. Appreciate PT and OT's assistance in getting Eddie Smith exercises that he can work on while at the inpatient rehabilitation center. Will continue with current management. Plan to discharge on Tuesday to [facility]. - Continue dual antiplatelet therapy x3wk - Tobacco, cocaine cessation - Physical, occupational therapy - Pending disposition at inpatient rehab  Best practice:  DIET: HH IVF: n/a DVT PPX: lovenox BOWEL: n/a CODE: FULL FAM COM: n/a  [facility], MD Internal Medicine Resident PGY-3 PAGER: [phone] 03/31/2022 10:32 AM  If after hours (below), please contact on-call pager: [phone] 5PM-7AM Monday-Friday 1PM-7AM Saturday-Sunday

## 2022-03-31 NOTE — Plan of Care (Signed)
  Problem: Education: Goal: Knowledge of General Education information will improve Description: Including pain rating scale, medication(s)/side effects and non-pharmacologic comfort measures Outcome: Progressing   Problem: Health Behavior/Discharge Planning: Goal: Ability to manage health-related needs will improve Outcome: Progressing   Problem: Clinical Measurements: Goal: Ability to maintain clinical measurements within normal limits will improve Outcome: Progressing Goal: Will remain free from infection Outcome: Progressing Goal: Respiratory complications will improve Outcome: Progressing Goal: Cardiovascular complication will be avoided Outcome: Progressing   Problem: Nutrition: Goal: Adequate nutrition will be maintained Outcome: Progressing   Problem: Coping: Goal: Level of anxiety will decrease Outcome: Progressing   Problem: Elimination: Goal: Will not experience complications related to bowel motility Outcome: Progressing Goal: Will not experience complications related to urinary retention Outcome: Progressing   Problem: Pain Managment: Goal: General experience of comfort will improve Outcome: Progressing   Problem: Safety: Goal: Ability to remain free from injury will improve Outcome: Progressing   Problem: Skin Integrity: Goal: Risk for impaired skin integrity will decrease Outcome: Progressing   

## 2022-04-01 DIAGNOSIS — M318 Other specified necrotizing vasculopathies: Secondary | ICD-10-CM | POA: Diagnosis not present

## 2022-04-01 DIAGNOSIS — F1990 Other psychoactive substance use, unspecified, uncomplicated: Secondary | ICD-10-CM | POA: Diagnosis not present

## 2022-04-01 DIAGNOSIS — I639 Cerebral infarction, unspecified: Secondary | ICD-10-CM | POA: Diagnosis not present

## 2022-04-01 DIAGNOSIS — F1490 Cocaine use, unspecified, uncomplicated: Secondary | ICD-10-CM | POA: Diagnosis not present

## 2022-04-01 NOTE — Progress Notes (Signed)
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  Patient also has left hemiparesis and sensory loss possibly from small right subcortical infarct not visualized on MRI. PMH significant for HTN and prostatitis.   OT comments  Pt progressing towards established OT goals. Pt performing all ADL and IADL with distant supervision to mod I. Pt reporting he would like to decrease need for RW, but knows that it is what is safe right now. Pt performing shower transfer with supervision and showering with mod I. Pt requiring supervision for standing balance when reaching outside of BOS at this time, but able to don LB dressing and perform grooming in static standing with mod I. Pt requiring supervision for distance functional mobility. Pt continues to anticipate discharge at drug and alcohol rehabilitation.    Recommendations for follow up therapy are one component of a multi-disciplinary discharge planning process, led by the attending physician.  Recommendations may be updated based on patient status, additional functional criteria and insurance authorization.    Follow Up Recommendations  Other (comment) (Plans to go to drug and alcohol rehabilitation)    Assistance Recommended at Discharge PRN  Patient can return home with the following  A little help with walking and/or transfers;A little help with bathing/dressing/bathroom;Assistance with cooking/housework;Assist for transportation;Help with stairs or ramp for entrance   Equipment Recommendations  Tub/shower seat    Recommendations for Other Services      Precautions / Restrictions Precautions Precautions: Fall Precaution Comments: left side weakness Restrictions Weight Bearing Restrictions: No       Mobility  Bed Mobility Overal bed mobility: Modified Independent             General bed mobility comments: Mod I for getting OOB. In recliner on departure    Transfers Overall transfer level: Modified independent Equipment used: Rolling walker (2 wheels) Transfers: Sit to/from Stand                   Balance Overall balance assessment: Needs assistance Sitting-balance support: Feet unsupported, No upper extremity supported Sitting balance-Leahy Scale: Normal Sitting balance - Comments: Reaching to feet to don socks and apply lotion with mod I   Standing balance support: Bilateral upper extremity supported, During functional activity, Reliant on assistive device for balance Standing balance-Leahy Scale: Fair Standing balance comment: Pt holding RUE up and pulling sleeve back for RN to tape prior to shower without UE support. Pt observed with frequent bracing on RW or sink during session, but able to maintain static balance without support for short periods. With good safety awareness when reaching outside of BOS to support self on sturndy surface to decrease likliehood of falls                           ADL either performed or assessed with clinical judgement   ADL Overall ADL's : Needs assistance/impaired     Grooming: Wash/dry hands;Wash/dry face;Applying deodorant;Brushing hair;Modified independent;Sitting;Standing Grooming Details (indicate cue type and reason): Distant supervision to mod I for seated and standing grooming tasks. Pt with good safety awareness Upper Body Bathing: Modified independent;Sitting Upper Body Bathing Details (indicate cue type and reason): Performing during session Lower Body Bathing: Sit to/from stand;Modified independent Lower Body Bathing Details (indicate cue type and reason): Perfroming during session. Distant supervision  for safety. Upper Body Dressing : Modified independent;Sitting   Lower Body Dressing: Supervision/safety;Sit  to/from stand Lower Body Dressing Details (indicate cue type and reason): Distant supervision for safety Toilet Transfer: Supervision/safety;Ambulation;Rolling walker (2 wheels);Regular Toilet       Tub/ Shower Transfer: Walk-in shower;BSC/3in1;Ambulation;Rolling walker (2 wheels);Supervision/safety Tub/Shower Transfer Details (indicate cue type and reason): Pt reaching outside BOS with support from shower rail to place shampoo on holder prior to stepping into shower. Performing shower transfer with supervision. Pt open and receptive to feedback on how to keep self safe at home or at rehab. Functional mobility during ADLs: Supervision/safety;Rolling walker (2 wheels)      Extremity/Trunk Assessment Upper Extremity Assessment Upper Extremity Assessment: Overall WFL for tasks assessed [facility] for tasks assessed. Brushing hair with LUE during session, applying lotion with BUE, etc.)   Lower Extremity Assessment Lower Extremity Assessment: LLE deficits/detail LLE Deficits / Details: Pt reporting weakness, but was at 4/5 throughout. Was walking on toes throughout gait. When asked if LLE was hurting, pt reporting it was just weak. Also was able to lift LLE back to bed without difficulty.        Vision   Additional Comments: Pt reports vision is coming back with decreased L eye blurriness. Vision [facility] for tasks assessed   Perception Perception Perception: Within Functional Limits   Praxis Praxis Praxis: Not tested    Cognition Arousal/Alertness: Awake/alert Behavior During Therapy: WFL for tasks assessed/performed Overall Cognitive Status: Within Functional Limits for tasks assessed                                 General Comments: Pt has been managing his discharge plan to get to go to rehab for substance recovery. Pt demonstratin higher level cognition with skill to organize phone calls and keep track of who he has been in touch with as well as what they have said about  possible admission.        Exercises      Shoulder Instructions       General Comments VSS on RA.    Pertinent Vitals/ Pain       Pain Assessment Pain Assessment: No/denies pain Pain Intervention(s): Monitored during session  Home Living                                          Prior Functioning/Environment              Frequency  Min 2X/week        Progress Toward Goals  OT Goals(current goals can now be found in the care plan section)  Progress towards OT goals: Progressing toward goals  Acute Rehab OT Goals Patient Stated Goal: Go to rehab OT Goal Formulation: With patient Time For Goal Achievement: 04/11/22 Potential to Achieve Goals: Good ADL Goals Pt Will Perform Grooming: with supervision;standing Pt Will Perform Upper Body Bathing: with set-up;sitting Pt Will Perform Lower Body Bathing: with supervision;sit to/from stand Pt Will Perform Upper Body Dressing: with set-up;sitting Pt Will Perform Lower Body Dressing: with supervision;sit to/from stand Pt Will Transfer to Toilet: with supervision;ambulating Pt Will Perform Toileting - Clothing Manipulation and hygiene: with supervision;sit to/from stand Pt Will Perform Tub/Shower Transfer: Tub transfer;shower seat;Stand pivot transfer;ambulating;rolling walker Pt/caregiver will Perform Home Exercise Program: Right Upper extremity;Increased ROM;Increased strength;With written HEP provided  Plan  Discharge plan remains appropriate    Co-evaluation                 AM-PAC OT "6 Clicks" Daily Activity     Outcome Measure   Help from another person eating meals?: None Help from another person taking care of personal grooming?: None Help from another person toileting, which includes using toliet, bedpan, or urinal?: A Little Help from another person bathing (including washing, rinsing, drying)?: None Help from another person to put on and taking off regular upper body clothing?:  None Help from another person to put on and taking off regular lower body clothing?: None 6 Click Score: 23    End of Session    OT Visit Diagnosis: Unsteadiness on feet (R26.81);Hemiplegia and hemiparesis Hemiplegia - Right/Left: Left Hemiplegia - dominant/non-dominant: Non-Dominant Hemiplegia - caused by: Cerebral infarction   Activity Tolerance     Patient Left     Nurse Communication          Time: 5053-9767 OT Time Calculation (min): 52 min  Charges: OT General Charges $OT Visit: 1 Visit OT Treatments $Self Care/Home Management : 38-52 mins  Ladene Artist, OTR/L [facility] Acute Rehabilitation Office: [phone]   Drue Novel 04/01/2022, 4:45 PM

## 2022-04-01 NOTE — TOC Progression Note (Signed)
Transition of Care [facility]) - Progression Note    Patient Details  Name: Eddie Smith MRN: [id number] Date of Birth: 1980/10/03  Transition of Care [facility]) CM/SW Contact  Kermit Balo, RN Phone Number: 04/01/2022, 3:36 PM  Clinical Narrative:    CM has attempted to assist in getting pt into [facility] recovery. CM called [facility] and they do not see where his medicaid has changed to [facility]. CM called DSS twice and they state this change wont happen until the first of September. CM asked to please have this expedited and they state this is only done at the beginning or end of a month.  CM has called: [facility] and they are not open until September [facility] and line is continuously busy [facility] and they dont take medicaid [facility] and they are permanently closed [facility] in [place] and they wont take anyone that isnt 14 days or longer past acute treatment [facility] phone number disconnected [employer] [facility] doesn't take [employer] in [place]   Pt heard from [place] that they may open [facility]. CM faxing them pts info: [phone]     Barriers to Discharge: Continued Medical Work up  Expected Discharge Plan and Services     Discharge Planning Services: CM Consult                     DME Arranged: [employer] DME Agency: [employer] Date DME Agency Contacted: 03/29/22   Representative spoke with at DME Agency: lacretia             Social Determinants of Health (SDOH) Interventions    Readmission Risk Interventions     No data to display

## 2022-04-01 NOTE — Progress Notes (Signed)
NAME:  Eddie Smith, MRN:  [id number], DOB:  Nov 15, 1980, LOS: 5 ADMISSION DATE:  03/27/2022  Subjective  Patient evaluated at bedside this AM. Today he reports improvements of his symptoms and feels he is progressing with PT/OT. Feels that his LLE is still week but has improved with therapy. Discussed plan to discharge to inpatient rehabilitation.   Per social work, patient will not be able to discharge to [facility] tomorrow, will have to wait until Sept 1st for his medicaid to officially switch to [employer]. Will discuss alternatives to other facilities in the meantime. Patient was made aware of this and is currently calling other facilities. States he has no friends or family that could take him in at the moment.  Objective   Blood pressure 115/78, pulse 83, temperature 97.9 F (36.6 C), temperature source Oral, resp. rate 16, weight 64.7 kg, SpO2 99 %.     Intake/Output Summary (Last 24 hours) at 04/01/2022 1441 Last data filed at 04/01/2022 1357 Gross per 24 hour  Intake 240 ml  Output 650 ml  Net -410 ml   Filed Weights   03/30/22 0458 03/30/22 0526 03/31/22 0500  Weight: 65.4 kg 65 kg 64.7 kg   Physical Exam: General: Well-appearing person in bed, no acute distress Pulm: Normal work of breathing on room air. Neuro: Awake, alert, conversing appropriately.  Finger-nose test: normal Right,  hesitation & tremor on left Rapid Pronation Supination: normal Right, slowed on left Gait appropriate with walker, having some weakness still in his left lower extremity.    Labs       Latest Ref Rng & Units 03/29/2022    3:12 AM 03/28/2022    2:47 AM 03/27/2022    6:23 AM  CBC  WBC 4.0 - 10.5 K/uL 7.4  6.3    Hemoglobin 13.0 - 17.0 g/dL 26.9  48.5  46.2   Hematocrit 39.0 - 52.0 % 48.3  47.8  52.0   Platelets 150 - 400 K/uL 249  235        Latest Ref Rng & Units 03/29/2022    3:12 AM 03/28/2022    2:47 AM 03/27/2022    6:23 AM  BMP  Glucose 70 - 99 mg/dL 93  97  703    BUN 6 - 20 mg/dL 11  6  6    Creatinine 0.61 - 1.24 mg/dL  5.00  9.38   Sodium 135 - 145 mmol/L 135  134  133   Potassium 3.5 - 5.1 mmol/L 4.2  4.4  3.4   Chloride 98 - 111 mmol/L 104  103  95   CO2 22 - 32 mmol/L 24  26    Calcium 8.9 - 10.3 mg/dL 8.7  8.6      Summary  Eddie Smith is 41yo person with polysubstance use, depression admitted 8/16 with acute right cerebellar CVA, although symptoms primarily thought to be 2/2 cocaine vasculopathy, medically stable pending disposition to inpatient drug and alcohol rehabilitation facility.  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 Appreciate PT and OT's assistance in the patient's rehabilitation, appeared motivated this morning and reported improved strength. Plan is to continue with current management. Per  CSW today, patient will not be able to transition to [facility] tomorrow as planned.  - Continue dual antiplatelet therapy x3wk - Tobacco, cocaine cessation - Physical, occupational therapy - Pending disposition to IP rehab, seems like CSW has exhausted current possible options. Patient states he has no family or friends he could stay with until Sept 1st. Appreciative of CSW efforts on this front.  - TOC consult was placed to discuss shelter or [place] options.   Best practice:  DIET: HH IVF: n/a DVT PPX: lovenox BOWEL: n/a CODE: FULL FAM COM: n/a  Lorri Frederick, PGY-1 Internal Medicine Residency Pager: [phone]  04/01/2022 2:41 PM  If after hours (below), please contact on-call pager: [phone] 5PM-7AM Monday-Friday 1PM-7AM Saturday-Sunday

## 2022-04-01 NOTE — Progress Notes (Signed)
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  Patient also has left hemiparesis and sensory loss possibly from small right subcortical infarct not visualized on MRI. PMH significant for HTN and prostatitis.    PT Comments    Pt progressing towards physical therapy goals. Pt reports continued tightness in L lower leg which he reports is limiting his mobility. Noted trigger points in anterior tibialis muscle and 8 minutes of soft tissue massage was performed followed by dynamic stretching, with reported improvement in pain and tightness sensation. Encouraged pt to perform L ankle DF exercise with AROM only, and to defer the theraband for now to avoid overstressing the muscle further. Will continue to follow and progress as able per POC.     Recommendations for follow up therapy are one component of a multi-disciplinary discharge planning process, led by the attending physician.  Recommendations may be updated based on patient status, additional functional criteria and insurance authorization.  Follow Up Recommendations  Home health PT     Assistance Recommended at Discharge Intermittent Supervision/Assistance  Patient can return home with the following Assistance with cooking/housework;Assist for transportation   Equipment Recommendations  Rolling walker (2 wheels) (youth)    Recommendations for Other Services       Precautions / Restrictions Precautions Precautions: Fall Precaution Comments: left side weakness Restrictions Weight Bearing Restrictions: No     Mobility  Bed Mobility Overal bed mobility: Modified Independent Bed Mobility: Supine to Sit           General bed mobility comments: No assist required to transition to EOB.     Transfers Overall transfer level: Modified independent Equipment used: Rolling walker (2 wheels) Transfers: Sit to/from Stand             General transfer comment: Increased time to power up to full stand. No assist required. Good demonstration of hand placement on seated surface for safety.    Ambulation/Gait Ambulation/Gait assistance: Supervision Gait Distance (Feet): 200 Feet Assistive device: Rolling walker (2 wheels) Gait Pattern/deviations: Step-through pattern, Decreased stride length, Decreased weight shift to left, Decreased dorsiflexion - left, Trunk flexed Gait velocity: decreased Gait velocity interpretation: 1.31 - 2.62 ft/sec, indicative of limited community ambulator   General Gait Details: Initially pt attempting to keep weight off the LLE, holding heel up and demonstrating toe touch weight bearing. Time taken to achieve foot flat on ground with full knee extension. Audible "pop" with knee extension and pt reports feeling less stiff in the knee after. After ~25' ambulation pt appeared more comfortable and demonstrating heel strike. Slow but steady with RW for support.   Stairs             Wheelchair Mobility    Modified Rankin (Stroke Patients Only) Modified Rankin (Stroke Patients Only) Pre-Morbid Rankin Score: No symptoms Modified Rankin: Moderately severe disability     Balance Overall balance assessment: Needs assistance Sitting-balance support: Feet unsupported, No upper extremity supported Sitting balance-Leahy Scale: Normal     Standing balance support: Bilateral upper extremity supported, During functional activity, Reliant on assistive device for balance Standing balance-Leahy Scale: Poor Standing balance comment: Reliant on UE support while standing at the sink for ADL but no assist required from therapist.  Cognition Arousal/Alertness: Awake/alert Behavior During Therapy: WFL for tasks  assessed/performed Overall Cognitive Status: Within Functional Limits for tasks assessed                                          Exercises      General Comments        Pertinent Vitals/Pain Pain Assessment Pain Assessment: Faces Faces Pain Scale: Hurts a little bit Pain Location: L foot/heel (N/T) and shin (anterior tib distribution - tight/cramping). Pain Descriptors / Indicators: Pins and needles, Numbness, Tingling, Tightness, Cramping Pain Intervention(s): Limited activity within patient's tolerance, Monitored during session, Repositioned    Home Living                          Prior Function            PT Goals (current goals can now be found in the care plan section) Acute Rehab PT Goals Patient Stated Goal: to be independent again PT Goal Formulation: With patient Time For Goal Achievement: 04/11/22 Potential to Achieve Goals: Good Progress towards PT goals: Progressing toward goals    Frequency    Min 4X/week      PT Plan Current plan remains appropriate    Co-evaluation              AM-PAC PT "6 Clicks" Mobility   Outcome Measure  Help needed turning from your back to your side while in a flat bed without using bedrails?: None Help needed moving from lying on your back to sitting on the side of a flat bed without using bedrails?: A Little Help needed moving to and from a bed to a chair (including a wheelchair)?: A Little Help needed standing up from a chair using your arms (e.g., wheelchair or bedside chair)?: A Little Help needed to walk in hospital room?: A Little Help needed climbing 3-5 steps with a railing? : A Little 6 Click Score: 19    End of Session Equipment Utilized During Treatment: Gait belt Activity Tolerance: Patient tolerated treatment well Patient left: in bed;with call bell/phone within reach;with bed alarm set Nurse Communication: Mobility status PT Visit Diagnosis: Unsteadiness on feet  (R26.81);Muscle weakness (generalized) (M62.81)     Time: 3570-1779 PT Time Calculation (min) (ACUTE ONLY): 32 min  Charges:  $Gait Training: 8-22 mins $Massage: 8-22 mins                     Conni Slipper, PT, DPT Acute Rehabilitation Services Secure Chat Preferred Office: [phone]    Marylynn Pearson 04/01/2022, 11:58 AM

## 2022-04-01 NOTE — Progress Notes (Signed)
OT Cancellation Note  Patient Details Name: Eddie Smith MRN: [id number] DOB: 01-Oct-1980   Cancelled Treatment:    Reason Eval/Treat Not Completed: Other (comment) (Pt on phone on hold with insurance and has been waiting >45 minutes. Will return as scheule allows.)  Ladene Artist, OTR/L [facility] Acute Rehabilitation Office: [phone]  Drue Novel 04/01/2022, 1:10 PM

## 2022-04-02 ENCOUNTER — Other Ambulatory Visit (HOSPITAL_COMMUNITY): Payer: Self-pay

## 2022-04-02 MED ORDER — CLOPIDOGREL BISULFATE 75 MG PO TABS
75.0000 mg | ORAL_TABLET | Freq: Every day | ORAL | 0 refills | Status: AC
Start: 2022-04-02 — End: 2022-04-21
  Filled 2022-04-02: qty 19, 19d supply, fill #0

## 2022-04-02 MED ORDER — ASPIRIN 81 MG PO CHEW
81.0000 mg | CHEWABLE_TABLET | Freq: Every day | ORAL | 0 refills | Status: DC
  Filled 2022-04-02: qty 30, 30d supply, fill #0

## 2022-04-02 NOTE — Discharge Summary (Signed)
Physician Discharge Summary  Patient ID: Eddie Smith MRN: [id number] DOB/AGE: [birth year] 40 y.o.  Admit date: 03/27/2022 Discharge date: 04/02/2022    Patient Summary  Eddie Smith is a 41 year old male with past medical history of depression, and polysubstance use disorder who presents to the emergency room for chest pain and was found to have left-sided weakness and blurry vision, and was admitted for right cerebellum CVA.   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  There is no arterial stenosis.  Patient's symptoms were not fully explained by the small infarct at the cerebellum but the MRI view was limited.  Cocaine induced vasospasm was believed to have also contributed to his symptoms. 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  They believe that the small right cerebellar infarct is likely clinically silent.  Patient's multiple risk factors include hypertension, ongoing cocaine use, and tobacco use.  Patient's UDS was positive for cocaine and amphetamines.  Echocardiogram revealed LVEF 60 to 65%, no RWMA, no valvular abnormalities, no atrial level shunt detected.  Patient was evaluated by PT/OT throughout his hospital course.  Patient strength improved, facial droop appears to have resolved on physical exam.   #Polysubstance use Patient reported feeling motivated to quit using recreational drugs and plan for inpatient rehabilitation center following discharge.  Social work and internal medicine team collaborated to identify possible facilities that best fit his disposition plan. Was counseled on tobacco and cocaine cessation. Patient was originally transitioning to [facility] following discharge, but will have to wait until  September 1 for his Medicaid to change to [facility].  Patient reports that he will remain with a friend, and a healthy environment, until he is able to go to [facility] or apply to [employer] in the meantime.  Atypical chest pain, RESOLVED Lateral chest wall tenderness, RESOLVED ACS ruled out, normal trops and unremarkable EKG. Likely cocaine-induced vasospasm. He is still experiencing some chest discomfort today, unclear if could be associated to vomiting episode (possible MW tear). Will provide GI cocktail today to assess for relief. ECHO with no abnormalities noted. Lateral chest wall tenderness likely soreness from repeated emesis prior to arrival.  ACS was ruled out on admission, there were no new or changing symptoms during the hospital course.  GI cocktail was added as needed for reflux symptoms. Patient denied chest pain and tenderness when evaluated on 03/30/2022.  Hematemesis - RESOLVED Abdominal pain Unclear etiology of vomiting episodes but likely resulted in a MW tear. No longer having any N/V. Hgb stable. Has mild periumbilical pain on exam but tolerating PO intake. Was given Zofran as needed and GI cocktail.  No further hematemesis observed during hospital course, vitals remained stable.  Chronic Stable Issues   Continue dual antiplatelet therapy for 2 weeks: Plavix 75 mg daily, aspirin 81 mg daily for acute right cerebellar infarct/cocaine vasculopathy.  Continue ambulation on walker as needed.  Continue plans to go to [facility] on September 1 for rehabilitation.  Transitional Issues  - Patient will follow up with internal medicine clinic prior to going to [facility].  He is scheduled to see Dr. Sloan Leiter on August 30 at 10:15 AM.  - New meds: Plavix 75 mg, Aspirin 81 mg (dual antiplatelet therapy for 2 weeks)  Significant Hospital tests/ studies  Drugs of Abuse  Component Value Date/Time   LABOPIA NONE DETECTED 03/27/2022 0719   COCAINSCRNUR POSITIVE (A) 03/27/2022  0719   LABBENZ NONE DETECTED 03/27/2022 0719   AMPHETMU POSITIVE (A) 03/27/2022 0719   THCU NONE DETECTED 03/27/2022 0719   LABBARB NONE DETECTED 03/27/2022 0719     Recent Results (from the past 43800 hour(s))  ECHOCARDIOGRAM COMPLETE   Collection Time: 03/27/22  2:23 PM  Result Value   Weight 2,222.24   BP 103/80   S' Lateral 2.90   AR max vel 2.99   AV Area VTI 3.01   AV Mean grad 3.0   AV Peak grad 5.5   Ao pk vel 1.17   Area-P 1/2 3.65   AV Area mean vel 2.79   Narrative      ECHOCARDIOGRAM REPORT       Patient Name:   Eddie Smith Date of Exam: 03/27/2022 Medical Rec #:  [id number]             Height:       61.0 in Accession #:    [id number]            Weight:       138.9 lb Date of Birth:  Apr 13, 1981            BSA:          1.618 m Patient Age:    28 years              BP:           103/80 mmHg Patient Gender: M                     HR:           61 bpm. Exam Location:  Inpatient  Procedure: 2D Echo, Color Doppler and Cardiac Doppler  Indications:    stroke   History:        Patient has prior history of Echocardiogram examinations, most                 recent 01/22/2022. Stroke; Signs/Symptoms:Chest Pain.   Sonographer:    Melissa Morford RDCS (AE, PE) Referring Phys: [place]    1. Left ventricular ejection fraction, by estimation, is 60 to 65%. The left ventricle has normal function. The left ventricle has no regional wall motion abnormalities. Left ventricular diastolic parameters were normal.  2. Right ventricular systolic function is normal. The right ventricular size is normal.  3. The mitral valve is normal in structure. No evidence of mitral valve regurgitation. No evidence of mitral stenosis.  4. The aortic valve is normal in structure. Aortic valve regurgitation is not visualized. Aortic valve sclerosis is present, with no evidence of aortic valve stenosis.  5. The inferior vena cava is normal in size with greater  than 50% respiratory variability, suggesting right atrial pressure of 3 mmHg.  Comparison(s): A prior study was performed on 01/2022. No significant change from prior study.  FINDINGS  Left Ventricle: Left ventricular ejection fraction, by estimation, is 60 to 65%. The left ventricle has normal function. The left ventricle has no regional wall motion abnormalities. The left ventricular internal cavity size was normal in size. There is  no left ventricular hypertrophy. Left ventricular diastolic parameters were normal.  Right Ventricle: The right ventricular size is normal. No increase in right ventricular wall thickness. Right ventricular systolic function is normal.  Left Atrium: Left atrial size was normal in size.  Right Atrium: Right atrial size was normal in size.  Pericardium: There is no evidence of pericardial effusion.  Mitral Valve: The mitral valve is normal in structure. No evidence of mitral valve regurgitation. No evidence of mitral valve stenosis.  Tricuspid Valve: The tricuspid valve is normal in structure. Tricuspid valve regurgitation is mild . No evidence of tricuspid stenosis.  Aortic Valve: The aortic valve is normal in structure. Aortic valve regurgitation is not visualized. Aortic valve sclerosis is present, with no evidence of aortic valve stenosis. Aortic valve mean gradient measures 3.0 mmHg. Aortic valve peak gradient  measures 5.5 mmHg. Aortic valve area, by VTI measures 3.01 cm.  Pulmonic Valve: The pulmonic valve was not well visualized. Pulmonic valve regurgitation is not visualized. No evidence of pulmonic stenosis.  Aorta: The aortic root is normal in size and structure.  Venous: The inferior vena cava is normal in size with greater than 50% respiratory variability, suggesting right atrial pressure of 3 mmHg.  IAS/Shunts: No atrial level shunt detected by color flow Doppler.    LEFT VENTRICLE PLAX 2D LVIDd:         4.60 cm   Diastology LVIDs:          2.90 cm   LV e' medial:    9.40 cm/s LV PW:         0.80 cm   LV E/e' medial:  5.4 LV IVS:        0.80 cm   LV e' lateral:   15.80 cm/s LVOT diam:     2.10 cm   LV E/e' lateral: 3.2 LV SV:         64 LV SV Index:   39 LVOT Area:     3.46 cm    RIGHT VENTRICLE RV Basal diam:  3.20 cm RV Mid diam:    2.40 cm RV S prime:     11.30 cm/s TAPSE (M-mode): 2.3 cm  LEFT ATRIUM             Index        RIGHT ATRIUM           Index LA diam:        2.90 cm 1.79 cm/m   RA Area:     12.90 cm LA Vol (A2C):   40.7 ml 25.16 ml/m  RA Volume:   29.60 ml  18.30 ml/m LA Vol (A4C):   35.0 ml 21.64 ml/m LA Biplane Vol: 41.5 ml 25.66 ml/m  AORTIC VALVE                    PULMONIC VALVE AV Area (Vmax):    2.99 cm     PR End Diast Vel: 0.89 msec AV Area (Vmean):   2.79 cm AV Area (VTI):     3.01 cm AV Vmax:           117.00 cm/s AV Vmean:          78.400 cm/s AV VTI:            0.212 m AV Peak Grad:      5.5 mmHg AV Mean Grad:      3.0 mmHg LVOT Vmax:         101.00 cm/s LVOT Vmean:        63.200 cm/s LVOT VTI:          0.184 m LVOT/AV VTI ratio: 0.87   AORTA Ao Root diam: 3.10 cm  MITRAL VALVE MV Area (PHT): 3.65  cm    SHUNTS MV Decel Time: 208 msec    Systemic VTI:  0.18 m MV E velocity: 51.05 cm/s  Systemic Diam: 2.10 cm MV A velocity: 49.90 cm/s MV E/A ratio:  1.02  Kardie Tobb DO Electronically signed by Berniece Salines DO Signature Date/Time: 03/27/2022/3:39:13 PM       Final     *Note: Due to a large number of results and/or encounters for the requested time period, some results have not been displayed. A complete set of results can be found in Results Review.    Procedures   None  Discharge Exam  BP 102/74 (BP Location: Left Arm)   Pulse 75   Temp 98.3 F (36.8 C) (Oral)   Resp 18   Wt 64.7 kg   SpO2 100%   BMI 26.95 kg/m   General: Resting comfortably in no acute distress Pulm: Normal work of breathing on room air.  Neuro: Awake, alert, conversing  appropriately. Strength 5/5 RUE, RLE; 4/5 LUE, LLE; mild decrease in sensation throughout left hemibody. Finger-nose test: normal Right,  hesitation & tremor on left Rapid Pronation Supination: normal Right, slowed on left Gait appropriate with walker, having some weakness still in his left lower extremity.   Psych: Normal mood, affect, speech Labs at discharge   Lab Results  Component Value Date   CREATININE 0.73 03/29/2022   BUN 11 03/29/2022   NA 135 03/29/2022   K 4.2 03/29/2022   CL 104 03/29/2022   CO2 24 03/29/2022   Lab Results  Component Value Date   WBC 7.4 03/29/2022   HGB 16.3 03/29/2022   HCT 48.3 03/29/2022   MCV 100.6 (H) 03/29/2022   PLT 249 03/29/2022   Lab Results  Component Value Date   ALT 19 03/27/2022   AST 33 03/27/2022   ALKPHOS 77 03/27/2022   BILITOT 1.2 03/27/2022   Lab Results  Component Value Date   INR 1.0 03/27/2022   INR 0.89 08/07/2015    Current radiological studies    No results found.  Disposition:    Discharge disposition: 01-Home or Self Care       Discharge Instructions     Ambulatory referral to Occupational Therapy   Complete by: As directed    Ambulatory referral to Physical Therapy   Complete by: As directed    Diet - low sodium heart healthy   Complete by: As directed    Discharge instructions   Complete by: As directed    Dear Mr Smigielski, on behalf of the internal medicine team it has been a pleasure caring for you. You were admitted to the hospital for weakness on the left side of your body, difficulty moving, and chest pain. While you were here, we found a stroke on the right side of your brain that we believe was associated with cocaine use. Initially you had  weakness on the left side of your body and face that was consistent with the side of your brain that had the stroke. You were started on Plavix and Aspirin, two important medications that have helped improve your stroke symptoms and prevent future strokes  form happening. While you were here you received physical and occupational therapy. You have gotten much better with the therapy and medications you have been given, your strength will continue to improve as you progress. It is very important you continue on your path of sobriety and stay away from using drugs such a cocaine to prevent future incidents such as these.  You seem motivated and ready to make these big changes in your life and it has been great watching you progress down this path. The plan is to continue your rehabilitaiton at [facility], starting September 1st.   When you are discharged home, we would like you to do the following: 1. Continue taking your plavix and aspirin daily      -Take 1 tablet of Plavix 75 mg once a day      -Take 1 tablet of Aspirin 81 mg once a day 2. Use your walker for support and continue the home exercises physical and occupational therapy taught you 3. Avoid drugs, alcohol, and smoking 4. Plan to go to [facility] Sep 1st  You are scheduled to follow up at our clinic a week from today: Your Appointment is with Dr. Howie Ill at 10:15 AM on Wednesday, August 30th at [facility].   It was pleasure caring for you and we wish you all the best in your journey towards sobriety.     Dr. Howie Ill, Aug 30th at 10:15 AM.   Increase activity slowly   Complete by: As directed        Allergies as of 04/02/2022   No Known Allergies      Medication List     STOP taking these medications    albuterol 108 (90 Base) MCG/ACT inhaler Commonly known as: VENTOLIN HFA   naproxen 375 MG tablet Commonly known as: NAPROSYN   ondansetron 4 MG disintegrating tablet Commonly known as: ZOFRAN-ODT       TAKE these medications    Aspirin Low Dose 81 MG chewable tablet Generic drug: aspirin Chew 1 tablet (81 mg total) by mouth daily.   clopidogrel 75 MG tablet Commonly known as: PLAVIX Take 1 tablet (75 mg total) by mouth daily for 19 days.                Durable Medical Equipment  (From admission, onward)           Start     Ordered   03/29/22 1506  For home use only DME Walker rolling  Once       Comments: Youth size  Question Answer Comment  Walker: With 5 Inch Wheels   Patient needs a walker to treat with the following condition CVA (cerebral vascular accident) ([place])      03/29/22 1505   03/29/22 1433  For home use only DME Walker rolling  Once       Question Answer Comment  Walker: With [place]   Patient needs a walker to treat with the following condition Stroke [facility])      03/29/22 1432              Discharge Condition   stable   Signed: Christene Slates, PGY-1 Internal Medicine Residency Pager: [phone]  04/02/2022, 4:31 PM

## 2022-04-02 NOTE — TOC Progression Note (Signed)
Transition of Care [facility]) - Progression Note    Patient Details  Name: Eddie Smith MRN: [id number] Date of Birth: 02/28/1981  Transition of Care [facility]) CM/SW Contact  Kermit Balo, RN Phone Number: 04/02/2022, 11:50 AM  Clinical Narrative:    CM has called: [facility] in [place] and they only take Tailored Medicaid and pt has standard medicaid. To switch his plan would take until the first of September.  [facility] in :[place] doesn't take [employer].  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     Barriers to Discharge: Continued Medical Work up  Expected Discharge Plan and Services     Discharge Planning Services: CM Consult                     DME Arranged: Dan Humphreys youth DME Agency: [employer] Date DME Agency Contacted: 03/29/22   Representative spoke with at DME Agency: lacretia             Social Determinants of Health (SDOH) Interventions    Readmission Risk Interventions     No data to display

## 2022-04-02 NOTE — TOC Transition Note (Signed)
Transition of Care [facility]) - CM/SW Discharge Note   Patient Details  Name: Iziah Cates MRN: [id number] Date of Birth: Oct 01, 1980  Transition of [facility]) CM/SW Contact:  Kermit Balo, RN Phone Number: 04/02/2022, 4:03 PM   Clinical Narrative:    Pt is discharging to friends home. CM has been unable to find him an inpatient rehab. [facility] and they do an intensive outpatient program. CM has sent his information to [facility] to see if he can be seen there. He plans on getting into [facility] on the first once his medicaid switches to [facility].  Medications for home to be delivered to the room per [facility] pharmacy.  Outpatient therapy arranged through [facility]. Information on the AVS. Pt should have transport through his medicaid to the appts. CM also provided him with bus passes to assist.  CM will provide cab voucher for transport to his friends home.   Final next level of care: Home/Self Care Barriers to Discharge: No Barriers Identified   Patient Goals and CMS Choice        Discharge Placement                       Discharge Plan and Services   Discharge Planning Services: CM Consult            DME Arranged: Dan Humphreys youth DME Agency: [employer] Date DME Agency Contacted: 03/29/22   Representative spoke with at DME Agency: lacretia            Social Determinants of Health (SDOH) Interventions     Readmission Risk Interventions     No data to display

## 2022-04-02 NOTE — Progress Notes (Signed)
Discharge instructions discussed with patient. All questions answered. IV removed, toc meds delivered to room, patient clothing/belongings returned. Patient wheeled off unit for taxi transport to friend's home. Cab voucher received.   Melony Overly, RN

## 2022-04-02 NOTE — Progress Notes (Signed)
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  Patient also has left hemiparesis and sensory loss possibly from small right subcortical infarct not visualized on MRI. PMH significant for HTN and prostatitis.    PT Comments    Pt remains very motivated and is progressing well towards all goals. Pt given seated and standing L adduction exercises with yellow theraband. Focused on progressing ambulation towards no AD. Pt remains to have L LE weakness and tightness. Acute PT to cont to follow.    Recommendations for follow up therapy are one component of a multi-disciplinary discharge planning process, led by the attending physician.  Recommendations may be updated based on patient status, additional functional criteria and insurance authorization.  Follow Up Recommendations  Home health PT     Assistance Recommended at Discharge Intermittent Supervision/Assistance  Patient can return home with the following Assistance with cooking/housework;Assist for transportation   Equipment Recommendations  Rolling walker (2 wheels) (youth)    Recommendations for Other Services       Precautions / Restrictions Precautions Precautions: Fall Precaution Comments: left LE weakness Restrictions Weight Bearing Restrictions: No     Mobility  Bed Mobility               General bed mobility comments: pt sitting up in chair upon PT arrival    Transfers Overall transfer level: Modified independent Equipment used: None Transfers: Sit to/from Stand Sit to Stand: Modified independent (Device/Increase time)           General transfer comment: pt able to push up from bed/chair with no difficulty, cautious, slightly guarded    Ambulation/Gait Ambulation/Gait  assistance: Supervision Gait Distance (Feet): 175 Feet Assistive device: Rolling walker (2 wheels), None Gait Pattern/deviations: Step-through pattern, Decreased stride length, Decreased weight shift to left, Decreased dorsiflexion - left, Narrow base of support Gait velocity: decreased Gait velocity interpretation: 1.31 - 2.62 ft/sec, indicative of limited community ambulator   General Gait Details: inital focus on decreasing UE support on RW and increasing L LE weight-bearing promoting heel toe gait pattern. focused on minimizing L LE external rotation. Progressed to only holding onto walker with R LE and PT aiding with continuous forward push of RW on L side, then progressed to walking close to R hallway rail without RW. pt wiht much more narrow base of support and decreased step height and length but improved with increased time   Stairs             Wheelchair Mobility    Modified Rankin (Stroke Patients Only) Modified Rankin (Stroke Patients Only) Pre-Morbid Rankin Score: No symptoms Modified Rankin: Moderately severe disability     Balance Overall balance assessment: Needs assistance Sitting-balance support: Feet unsupported, No upper extremity supported Sitting balance-Leahy Scale: Normal Sitting balance - Comments: Reaching to feet to don socks and apply lotion with mod I   Standing balance support: Bilateral upper extremity supported, During functional activity, Reliant on assistive device for balance Standing balance-Leahy Scale: Fair Standing balance comment: Pt holding RUE up and pulling sleeve back for RN to tape prior to shower without UE support. Pt observed with frequent bracing on RW or sink during session, but able to maintain static balance without support for short periods. With good safety awareness when reaching outside of BOS to support self on sturndy surface to  decrease likliehood of falls                            Cognition  Arousal/Alertness: Awake/alert Behavior During Therapy: WFL for tasks assessed/performed Overall Cognitive Status: Within Functional Limits for tasks assessed                                          Exercises Other Exercises Other Exercises: yellow theraband provided: instructing on seated L LE adduction, standing L LE adduction, and standing L knee flexion with adduction toward R hip, pt with good return demonstration Other Exercises: squats with increased weight shift on the L side via only putting toes of R foot down on the R    General Comments General comments (skin integrity, edema, etc.): VSS on RA      Pertinent Vitals/Pain Pain Assessment Pain Assessment: No/denies pain    Home Living                          Prior Function            PT Goals (current goals can now be found in the care plan section) Acute Rehab PT Goals Patient Stated Goal: to be independent again PT Goal Formulation: With patient Time For Goal Achievement: 04/11/22 Potential to Achieve Goals: Good Progress towards PT goals: Progressing toward goals    Frequency    Min 4X/week      PT Plan Current plan remains appropriate    Co-evaluation              AM-PAC PT "6 Clicks" Mobility   Outcome Measure  Help needed turning from your back to your side while in a flat bed without using bedrails?: None Help needed moving from lying on your back to sitting on the side of a flat bed without using bedrails?: A Little Help needed moving to and from a bed to a chair (including a wheelchair)?: A Little Help needed standing up from a chair using your arms (e.g., wheelchair or bedside chair)?: A Little Help needed to walk in hospital room?: A Little Help needed climbing 3-5 steps with a railing? : A Little 6 Click Score: 19    End of Session Equipment Utilized During Treatment: Gait belt Activity Tolerance: Patient tolerated treatment well Patient left: with  call bell/phone within reach;in chair Nurse Communication: Mobility status PT Visit Diagnosis: Unsteadiness on feet (R26.81);Muscle weakness (generalized) (M62.81)     Time: 1610-9604 PT Time Calculation (min) (ACUTE ONLY): 28 min  Charges:  $Gait Training: 8-22 mins $Therapeutic Exercise: 8-22 mins                     Lewis Shock, PT, DPT Acute Rehabilitation Services Secure chat preferred Office #: [phone]    Iona Hansen 04/02/2022, 12:32 PM

## 2022-04-10 ENCOUNTER — Encounter: Payer: Medicaid Other | Admitting: Student

## 2022-04-10 ENCOUNTER — Ambulatory Visit: Payer: Medicaid Other | Admitting: Internal Medicine

## 2022-07-04 ENCOUNTER — Emergency Department (HOSPITAL_COMMUNITY)
Admission: EM | Admit: 2022-07-04 | Discharge: 2022-07-04 | Disposition: A | Discharge status: Home / Self Care | Payer: Self-pay | Attending: Emergency Medicine | Admitting: Emergency Medicine

## 2022-07-04 ENCOUNTER — Emergency Department (HOSPITAL_COMMUNITY): Payer: Self-pay

## 2022-07-04 DIAGNOSIS — D72829 Elevated white blood cell count, unspecified: Secondary | ICD-10-CM | POA: Insufficient documentation

## 2022-07-04 DIAGNOSIS — R739 Hyperglycemia, unspecified: Secondary | ICD-10-CM | POA: Insufficient documentation

## 2022-07-04 DIAGNOSIS — T405X1A Poisoning by cocaine, accidental (unintentional), initial encounter: Secondary | ICD-10-CM | POA: Insufficient documentation

## 2022-07-04 DIAGNOSIS — F1910 Other psychoactive substance abuse, uncomplicated: Secondary | ICD-10-CM

## 2022-07-04 DIAGNOSIS — T50901A Poisoning by unspecified drugs, medicaments and biological substances, accidental (unintentional), initial encounter: Secondary | ICD-10-CM

## 2022-07-04 LAB — COMPREHENSIVE METABOLIC PANEL
ALT: 22 U/L (ref 0–44)
AST: 32 U/L (ref 15–41)
Albumin: 3.7 g/dL (ref 3.5–5.0)
Alkaline Phosphatase: 58 U/L (ref 38–126)
Anion gap: 14 (ref 5–15)
BUN: 5 mg/dL — ABNORMAL LOW (ref 6–20)
CO2: 22 mmol/L (ref 22–32)
Calcium: 8.6 mg/dL — ABNORMAL LOW (ref 8.9–10.3)
Chloride: 105 mmol/L (ref 98–111)
Creatinine, Ser: 1.18 mg/dL (ref 0.61–1.24)
GFR, Estimated: >60 mL/min (ref >60)
Glucose, Bld: 156 mg/dL — ABNORMAL HIGH (ref 70–99)
Potassium: 3.6 mmol/L (ref 3.5–5.1)
Sodium: 141 mmol/L (ref 135–145)
Total Bilirubin: 0.4 mg/dL (ref 0.3–1.2)
Total Protein: 6.8 g/dL (ref 6.5–8.1)

## 2022-07-04 LAB — CBC WITH DIFFERENTIAL/PLATELET
Abs Immature Granulocytes: 0.18 10*3/uL — ABNORMAL HIGH (ref 0.00–0.07)
Basophils Absolute: 0.1 10*3/uL (ref 0.0–0.1)
Basophils Relative: 0 %
Eosinophils Absolute: 0.1 10*3/uL (ref 0.0–0.5)
Eosinophils Relative: 1 %
HCT: 44.6 % (ref 39.0–52.0)
Hemoglobin: 14.9 g/dL (ref 13.0–17.0)
Immature Granulocytes: 1 %
Lymphocytes Relative: 16 %
Lymphs Abs: 2 10*3/uL (ref 0.7–4.0)
MCH: 34.1 pg — ABNORMAL HIGH (ref 26.0–34.0)
MCHC: 33.4 g/dL (ref 30.0–36.0)
MCV: 102.1 fL — ABNORMAL HIGH (ref 80.0–100.0)
Monocytes Absolute: 1 10*3/uL (ref 0.1–1.0)
Monocytes Relative: 7 %
Neutro Abs: 9.8 10*3/uL — ABNORMAL HIGH (ref 1.7–7.7)
Neutrophils Relative %: 75 %
Platelets: 296 10*3/uL (ref 150–400)
RBC: 4.37 MIL/uL (ref 4.22–5.81)
RDW: 14.5 % (ref 11.5–15.5)
WBC: 13.1 10*3/uL — ABNORMAL HIGH (ref 4.0–10.5)
nRBC: 0 % (ref 0.0–0.2)

## 2022-07-04 LAB — TROPONIN I (HIGH SENSITIVITY)
Troponin I (High Sensitivity): 22 ng/L — ABNORMAL HIGH (ref <18)
Troponin I (High Sensitivity): 21 ng/L — ABNORMAL HIGH (ref <18)

## 2022-07-04 LAB — SALICYLATE LEVEL: Salicylate Lvl: <7 mg/dL — ABNORMAL LOW (ref 7.0–30.0)

## 2022-07-04 LAB — RAPID URINE DRUG SCREEN, HOSP PERFORMED
Amphetamines: POSITIVE — AB
Barbiturates: NOT DETECTED
Benzodiazepines: NOT DETECTED
Cocaine: POSITIVE — AB
Opiates: NOT DETECTED
Tetrahydrocannabinol: NOT DETECTED

## 2022-07-04 LAB — CBG MONITORING, ED: Glucose-Capillary: 143 mg/dL — ABNORMAL HIGH (ref 70–99)

## 2022-07-04 LAB — ACETAMINOPHEN LEVEL: Acetaminophen (Tylenol), Serum: <10 ug/mL — ABNORMAL LOW (ref 10–30)

## 2022-07-04 LAB — ETHANOL: Alcohol, Ethyl (B): 125 mg/dL — ABNORMAL HIGH (ref <10)

## 2022-07-04 MED ORDER — LACTATED RINGERS IV BOLUS
1000.0000 mL | Freq: Once | INTRAVENOUS | Status: AC
  Administered 2022-07-04: 1000 mL via INTRAVENOUS

## 2022-07-04 MED ORDER — KETOROLAC TROMETHAMINE 30 MG/ML IJ SOLN
30.0000 mg | Freq: Once | INTRAMUSCULAR | Status: AC
  Administered 2022-07-04: 30 mg via INTRAVENOUS
  Filled 2022-07-04: qty 1

## 2022-07-04 MED ORDER — ONDANSETRON 4 MG PO TBDP: 4.0000 mg | ORAL_TABLET | ORAL | 0 refills | Status: DC | PRN

## 2022-07-04 MED ORDER — ONDANSETRON HCL 4 MG/2ML IJ SOLN
4.0000 mg | Freq: Once | INTRAMUSCULAR | Status: AC
  Administered 2022-07-04: 4 mg via INTRAVENOUS
  Filled 2022-07-04: qty 2

## 2022-07-04 NOTE — Discharge Instructions (Signed)
1.  Rest for the next 24 hours and stay hydrated.  Use Zofran if needed for nausea. 2.  Is very important that you are seen by a family doctor.  If you do not have 1 a resource guide has been included in your discharge instructions. 3.  You overdosed and this is a very serious episode.  Seek treatment if you need help managing addiction.

## 2022-07-04 NOTE — ED Notes (Signed)
Pt not allowing staff to get a rectal temperature. Pt pulling away and sitting up. When trying to get an oral pt is unable to stay awake long enough for temperature to capture.  Provider aware

## 2022-07-04 NOTE — ED Notes (Signed)
Pt woke up and started vomiting. Pt states, "I feel like I'm going to pass out." MD notified and is currently at bedside. Will continue to monitor.

## 2022-07-04 NOTE — ED Triage Notes (Signed)
Pt arrived by EMS. Pt friend called from parking lot after pt snorted some drugs and became unconscious.  GFD gave 2mg  Narcan IN and had to assist ventilations with BVM for about   On arrival to ED pt 100 RA but still very lethargic and unable to respond to questioning. Opens eyes and jumps at pain.

## 2022-07-04 NOTE — ED Provider Notes (Signed)
[facility] EMERGENCY DEPARTMENT Provider Note   CSN: [id number] Arrival date & time: 07/04/22  0542     History  Chief Complaint  Patient presents with   Drug Overdose  Level 5 caveat due to altered mental status  Eddie Smith is a 41 y.o. male.  The history is provided by the patient. The history is limited by the condition of the patient.  Patient presents after presumed overdose.  Patient was found in a parked car unresponsive.  Patient was given Narcan and assisted ventilations and he started to wake up.  A friend reports the patient thought he was snorting cocaine.  Patient is somnolent but arousable. No other details are known on arrival     Home Medications Prior to Admission medications   Not on File      Allergies    Patient has no allergy information on record.    Review of Systems   Review of Systems  Unable to perform ROS: Mental status change    Physical Exam Updated Vital Signs BP 118/78   Pulse 95   Temp (!) 96 F (35.6 C) (Temporal)   Resp 13   SpO2 99%  Physical Exam CONSTITUTIONAL: Disheveled, somnolent HEAD: Normocephalic/atraumatic, no signs of trauma EYES: Pupils pinpoint ENMT: Mucous membranes moist NECK: supple no meningeal signs SPINE/BACK:entire spine nontender CV: S1/S2 noted, no murmurs/rubs/gallops noted LUNGS: Coarse breath sounds bilaterally ABDOMEN: soft, nontender NEURO: Pt is somnolent but easily arousable to voice and pain.  He moves all extremities x 4 EXTREMITIES: pulses normal/equal, full ROM, no deformities SKIN: warm, color normal PSYCH: unable to assess  ED Results / Procedures / Treatments   Labs (all labs ordered are listed, but only abnormal results are displayed) Labs Reviewed  COMPREHENSIVE METABOLIC PANEL - Abnormal; Notable for the following components:      Result Value   Glucose, Bld 156 (*)    BUN 5 (*)    Calcium 8.6 (*)    All other components within normal limits   SALICYLATE LEVEL - Abnormal; Notable for the following components:   Salicylate Lvl <7.0 (*)    All other components within normal limits  ACETAMINOPHEN LEVEL - Abnormal; Notable for the following components:   Acetaminophen (Tylenol), Serum <10 (*)    All other components within normal limits  ETHANOL - Abnormal; Notable for the following components:   Alcohol, Ethyl (B) 125 (*)    All other components within normal limits  CBC WITH DIFFERENTIAL/PLATELET - Abnormal; Notable for the following components:   WBC 13.1 (*)    MCV 102.1 (*)    MCH 34.1 (*)    Neutro Abs 9.8 (*)    Abs Immature Granulocytes 0.18 (*)    All other components within normal limits  CBG MONITORING, ED - Abnormal; Notable for the following components:   Glucose-Capillary 143 (*)    All other components within normal limits  RAPID URINE DRUG SCREEN, HOSP PERFORMED    EKG EKG Interpretation  Date/Time:  Thursday July 04 2022 05:55:09 EST Ventricular Rate:  95 PR Interval:  135 QRS Duration: 91 QT Interval:  378 QTC Calculation: 476 R Axis:   63 Text Interpretation: Sinus rhythm Borderline prolonged QT interval Interpretation limited secondary to artifact Confirmed by Zadie Rhine ([id number]) on 07/04/2022 6:07:43 AM  Radiology DG Chest Port 1 View  Result Date: 07/04/2022 CLINICAL DATA:  Status post overdose. EXAM: PORTABLE CHEST 1 VIEW COMPARISON:  10/29/2017 FINDINGS: Normal cardiomediastinal contours. Lung volumes are  low. No pleural effusion, interstitial edema or airspace disease. The visualized osseous structures are unremarkable. IMPRESSION: Low lung volumes. No acute findings. Electronically Signed   By: Signa Kell M.D.   On: 07/04/2022 06:45    Procedures Procedures    Medications Ordered in ED Medications - No data to display  ED Course/ Medical Decision Making/ A&P Clinical Course as of 07/04/22 0710  Thu Jul 04, 2022  0611 Patient presents after presumed overdose.  Patient  responded to Narcan.  He is currently protecting his airway.  Imaging and labs are pending at this time [DW]  0636 WBC(!): 13.1 Leukocytosis [DW]  0636 Patient protecting airway, but is mildly hypoxic, he is now on nasal cannula [DW]  [phone] Patient still sleeping but arousable.  No acute distress [DW]  0709 Glucose(!): 156 Mild hyperglycemia [DW]  0709 Signed out to dr Donnald Garre at shift change to monitor patient.  Will need to be monitored for approximately 4 hrs.   [DW]    Clinical Course User Index [DW] Zadie Rhine, MD                           Medical Decision Making Amount and/or Complexity of Data Reviewed Labs: ordered. Decision-making details documented in ED Course. Radiology: ordered.   This patient presents to the ED for concern of overdose, this involves an extensive number of treatment options, and is a complaint that carries with it a high risk of complications and morbidity.  The differential diagnosis includes but is not limited to opiate overdose, benzo overdose, alcohol use  Comorbidities that complicate the patient evaluation: History unknown  Social Determinants of Health: History unknown  Additional history obtained: Additional history obtained from EMS discussed with EMS at bedside  Lab Tests: I Ordered, and personally interpreted labs.  The pertinent results include: Mild hyperglycemia, alcohol intoxication  Imaging Studies ordered: I ordered imaging studies including X-ray chest   I independently visualized and interpreted imaging which showed no acute findings I agree with the radiologist interpretation  Cardiac Monitoring: The patient was maintained on a cardiac monitor.  I personally viewed and interpreted the cardiac monitor which showed an underlying rhythm of:  sinus rhythm   Critical Interventions:   monitoring  Reevaluation: After the interventions noted above, I reevaluated the patient and found that they have :stayed the  same  Complexity of problems addressed: Patient's presentation is most consistent with  acute presentation with potential threat to life or bodily function           Final Clinical Impression(s) / ED Diagnoses Final diagnoses:  Accidental overdose, initial encounter    Rx / DC Orders ED Discharge Orders     None         Zadie Rhine, MD 07/04/22 [phone]

## 2022-07-04 NOTE — ED Provider Notes (Addendum)
Plan for monitoring until 930 rebound from drug overdose. Physical Exam  BP 97/77   Pulse 97   Temp (!) 96 F (35.6 C) (Temporal)   Resp 13   SpO2 99%   Physical Exam  Procedures  Procedures  ED Course / MDM   Clinical Course as of 07/04/22 0914  Thu Jul 04, 2022  0611 Patient presents after presumed overdose.  Patient responded to Narcan.  He is currently protecting his airway.  Imaging and labs are pending at this time [DW]  0636 WBC(!): 13.1 Leukocytosis [DW]  0636 Patient protecting airway, but is mildly hypoxic, he is now on nasal cannula [DW]  [phone] Patient still sleeping but arousable.  No acute distress [DW]  0709 Glucose(!): 156 Mild hyperglycemia [DW]  0709 Signed out to dr Donnald Garre at shift change to monitor patient.  Will need to be monitored for approximately 4 hrs.   [DW]    Clinical Course User Index [DW] Zadie Rhine, MD   Medical Decision Making Amount and/or Complexity of Data Reviewed Labs: ordered. Decision-making details documented in ED Course. Radiology: ordered.  Risk Prescription drug management.   Reassessment 9: 15.  Patient is resting quietly.  He is wearing oxygen with oxygen saturation at 100%.  Mildly tachycardic at sleep at about 115.  Patient awakens to light voice.  He reports he still feels bad and achy all over.  Generalized pain.  At this time I will discontinue oxygen and monitor for vital signs off oxygen.  Awake patient's heart rate went to 120.  We will also give 1 L of lactated Ringer's and encourage patient to eat something.  12: 20 patient reports that he still does not feel well.  He reports that he has a headache on the right parietal area.  He reports he also just feels like he aches all over his whole body.  He feels nauseated but thirsty.  He reports he cannot member anything after a certain point.  He remembers that he was drinking.  Patient reports he does not drink regularly but occasionally does.  He did not think he had  a lot to drink.  He does admit that he sometimes snort some cocaine and he thinks someone must have laced it with something because he doesn't remember anything afterward.  With persistent headache and now nausea and vomiting, will proceed with CT head.  There is possibility of trauma during the period which patient has no recall of events.  Will infuse another liter of lactated Ringer's for rehydration.  Will give Zofran for nausea.  Patient's mental status seems clear at this time.  His speech is situationally oriented and appropriate.  He does not seem confused.  He remains mildly tachycardic but heart rate is trending down.  CRITICAL CARE Performed by: Arby Barrette   Total critical care time: 30 minutes  Critical care time was exclusive of separately billable procedures and treating other patients.  Critical care was necessary to treat or prevent imminent or life-threatening deterioration.  Critical care was time spent personally by me on the following activities: development of treatment plan with patient and/or surrogate as well as nursing, discussions with consultants, evaluation of patient's response to treatment, examination of patient, obtaining history from patient or surrogate, ordering and performing treatments and interventions, ordering and review of laboratory studies, ordering and review of radiographic studies, pulse oximetry and re-evaluation of patient's condition.   CT head reviewed by radiology negative for any acute findings.  I have also  personally reviewed these images and do not see any evidence of bleed shift or mass.  After hydration, heart rate is in the 80s and blood pressures are stable.  This time patient is stable for discharge.  Discharge instructions include information about opioid overdose and resource for finding outpatient care.      Arby Barrette, MD 07/04/22 1223    Arby Barrette, MD 07/04/22 [phone]

## 2022-10-03 ENCOUNTER — Other Ambulatory Visit: Payer: Self-pay

## 2022-10-03 ENCOUNTER — Emergency Department (HOSPITAL_COMMUNITY)
Admission: EM | Admit: 2022-10-03 | Discharge: 2022-10-04 | Disposition: A | Discharge status: Home / Self Care | Payer: Medicaid Other | Attending: Emergency Medicine | Admitting: Emergency Medicine

## 2022-10-03 DIAGNOSIS — R369 Urethral discharge, unspecified: Secondary | ICD-10-CM | POA: Diagnosis present

## 2022-10-03 DIAGNOSIS — Z7982 Long term (current) use of aspirin: Secondary | ICD-10-CM | POA: Diagnosis not present

## 2022-10-03 DIAGNOSIS — R590 Localized enlarged lymph nodes: Secondary | ICD-10-CM | POA: Insufficient documentation

## 2022-10-03 DIAGNOSIS — N341 Nonspecific urethritis: Secondary | ICD-10-CM | POA: Diagnosis not present

## 2022-10-03 DIAGNOSIS — A630 Anogenital (venereal) warts: Secondary | ICD-10-CM | POA: Diagnosis not present

## 2022-10-03 LAB — COMPREHENSIVE METABOLIC PANEL
ALT: 12 U/L (ref 0–44)
AST: 20 U/L (ref 15–41)
Albumin: 3.7 g/dL (ref 3.5–5.0)
Alkaline Phosphatase: 70 U/L (ref 38–126)
Anion gap: 7 (ref 5–15)
BUN: 12 mg/dL (ref 6–20)
CO2: 26 mmol/L (ref 22–32)
Calcium: 8.9 mg/dL (ref 8.9–10.3)
Chloride: 102 mmol/L (ref 98–111)
Creatinine, Ser: 0.74 mg/dL (ref 0.61–1.24)
GFR, Estimated: >60 mL/min (ref >60)
Glucose, Bld: 97 mg/dL (ref 70–99)
Potassium: 4 mmol/L (ref 3.5–5.1)
Sodium: 135 mmol/L (ref 135–145)
Total Bilirubin: 0.4 mg/dL (ref 0.3–1.2)
Total Protein: 8.1 g/dL (ref 6.5–8.1)

## 2022-10-03 LAB — CBC WITH DIFFERENTIAL/PLATELET
Abs Immature Granulocytes: 0.03 10*3/uL (ref 0.00–0.07)
Basophils Absolute: 0.1 10*3/uL (ref 0.0–0.1)
Basophils Relative: 1 %
Eosinophils Absolute: 0.2 10*3/uL (ref 0.0–0.5)
Eosinophils Relative: 3 %
HCT: 48.1 % (ref 39.0–52.0)
Hemoglobin: 15.3 g/dL (ref 13.0–17.0)
Immature Granulocytes: 1 %
Lymphocytes Relative: 33 %
Lymphs Abs: 2 10*3/uL (ref 0.7–4.0)
MCH: 30.2 pg (ref 26.0–34.0)
MCHC: 31.8 g/dL (ref 30.0–36.0)
MCV: 95.1 fL (ref 80.0–100.0)
Monocytes Absolute: 0.9 10*3/uL (ref 0.1–1.0)
Monocytes Relative: 15 %
Neutro Abs: 2.8 10*3/uL (ref 1.7–7.7)
Neutrophils Relative %: 47 %
Platelets: 409 10*3/uL — ABNORMAL HIGH (ref 150–400)
RBC: 5.06 MIL/uL (ref 4.22–5.81)
RDW: 14 % (ref 11.5–15.5)
WBC: 6 10*3/uL (ref 4.0–10.5)
nRBC: 0 % (ref 0.0–0.2)

## 2022-10-03 LAB — LACTIC ACID, PLASMA
Lactic Acid, Venous: 0.8 mmol/L (ref 0.5–1.9)
Lactic Acid, Venous: 1.2 mmol/L (ref 0.5–1.9)

## 2022-10-03 MED ORDER — AZITHROMYCIN 250 MG PO TABS
1000.0000 mg | ORAL_TABLET | Freq: Once | ORAL | Status: AC
  Administered 2022-10-04: 1000 mg via ORAL
  Filled 2022-10-03: qty 4

## 2022-10-03 MED ORDER — CEFTRIAXONE SODIUM 1 G IJ SOLR
500.0000 mg | Freq: Once | INTRAMUSCULAR | Status: AC
  Administered 2022-10-04: 500 mg via INTRAMUSCULAR
  Filled 2022-10-03: qty 10

## 2022-10-03 MED ORDER — LIDOCAINE HCL (PF) 1 % IJ SOLN
1.0000 mL | Freq: Once | INTRAMUSCULAR | Status: AC
  Administered 2022-10-04: 2.1 mL
  Filled 2022-10-03: qty 30

## 2022-10-03 NOTE — ED Provider Notes (Signed)
[facility] Provider Note   CSN: [id number] Arrival date & time: 10/03/22  1943     History  Chief Complaint  Patient presents with   Abscess    Rectal    Eddie Smith is a 42 y.o. male.  HPI Patient reports he has been getting pain in the rectal area for about 2 weeks.  He reports he has noticed some white spots and then some drainage.  He denies any insertive sexual practices in the anus.  Patient denies burning with urination or penile discharge.  He reports he is in a monogamous relationship with his male partner.  Denies other abdominal pain fever chills or vomiting.    Home Medications Prior to Admission medications   Medication Sig Start Date End Date Taking? Authorizing Provider  doxycycline (VIBRAMYCIN) 100 MG capsule Take 1 capsule (100 mg total) by mouth 2 (two) times daily. 10/04/22  Yes Aloura Matsuoka, Jeannie Done, MD  ketoconazole (NIZORAL) 2 % cream Apply 1 Application topically daily. 10/04/22  Yes Charlesetta Shanks, MD  aspirin 81 MG chewable tablet Chew 1 tablet (81 mg total) by mouth daily. 04/02/22   Iona Beard, MD  ondansetron (ZOFRAN-ODT) 4 MG disintegrating tablet Take 1 tablet (4 mg total) by mouth every 4 (four) hours as needed for nausea or vomiting. 07/04/22   Charlesetta Shanks, MD      Allergies    Patient has no known allergies.    Review of Systems   Review of Systems  Physical Exam Updated Vital Signs BP 110/81   Pulse 93   Temp 98.7 F (37.1 C) (Oral)   Resp 16   SpO2 97%  Physical Exam Constitutional:      Comments: Patient is alert and nontoxic.  Resting quietly as I enter the room.  Mental status clear.  Eyes:     Extraocular Movements: Extraocular movements intact.  Pulmonary:     Effort: Pulmonary effort is normal.  Abdominal:     General: There is no distension.     Palpations: Abdomen is soft.     Tenderness: There is no abdominal tenderness. There is no guarding.  Genitourinary:     Comments: Inspection of the anal area shows 2 [place] like lesions approximately half a centimeter and multiple rough papular lesions parentally around the anal skin.  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  There is no palpable abscess or induration.  Examination of the penis shows yellow discharge from the urethra.  No lesions on the penis.  Patient has bilateral shotty lymphadenopathy in the inguinal regions. Musculoskeletal:     Comments: Left foot has a focal white plaque with some maceration between the fourth and fifth toes.  The toes are in good condition without any swelling of the foot or erythema.  findings consistent with focal fungal infection.  Skin:    General: Skin is warm and dry.  Neurological:     General: No focal deficit present.     Mental Status: He is oriented to person, place, and time.  Psychiatric:        Mood and Affect: Mood normal.     ED Results / Procedures / Treatments   Labs (all labs ordered are listed, but only abnormal results are displayed) Labs Reviewed  CBC WITH DIFFERENTIAL/PLATELET - Abnormal; Notable for the following components:      Result Value   Platelets 409 (*)    All other components within normal limits  LACTIC ACID, PLASMA  LACTIC ACID, PLASMA  COMPREHENSIVE METABOLIC PANEL  RPR  RAPID HIV SCREEN (HIV 1/2 AB+AG)  URINALYSIS, ROUTINE W REFLEX MICROSCOPIC  GC/CHLAMYDIA PROBE AMP ([place]) NOT AT [facility]    EKG None  Radiology No results found.  Procedures Procedures    Medications Ordered in ED Medications  lidocaine (PF) (XYLOCAINE) 1 % injection 1-2.1 mL (has no administration in time range)  azithromycin (ZITHROMAX) tablet 1,000 mg (1,000 mg Oral Given 10/04/22 0007)  cefTRIAXone (ROCEPHIN) injection 500 mg (500 mg Intramuscular Given 10/04/22 0003)    ED Course/ Medical Decision Making/ A&P                             Medical Decision Making Amount and/or Complexity of Data Reviewed Labs:  ordered.  Risk Prescription drug management.   Patient presents with a complaint of rectal pain and lesions.  On physical exam lesions are consistent with genital warts.  No appearance of any abscess.  Patient denied insertive sexual practices.  At this point, he will need further evaluation at the health department or PCP for anal warts.  With examination of the penis there is thick drainage present in the urethra and bilateral lymphadenopathy.  Findings are consistent with sexually transmitted infection.  Proceed with testing for HIV, syphilis, gonorrhea and chlamydia.  Will also start empiric treatment for GC and chlamydia based on findings.  Patient and his partner counseled on testing for sexually transmitted infection, treatment and avoiding sexual contact until all results are back and treatment is completed.  Patient also pointed out an area between his fifth and fourth toes on the left foot that has developed whitish macerated area in the crease.  No associated erythema or appearance of cellulitis.  Findings are consistent with focal fungal infection.  Will have the patient start with ketoconazole and clean dressings with follow-up with podiatry.        Final Clinical Impression(s) / ED Diagnoses Final diagnoses:  Anal warts  Penile discharge  Inguinal lymphadenopathy    Rx / DC Orders ED Discharge Orders          Ordered    doxycycline (VIBRAMYCIN) 100 MG capsule  2 times daily        10/04/22 0056    ketoconazole (NIZORAL) 2 % cream  Daily        10/04/22 0058              Charlesetta Shanks, MD 10/04/22 0105

## 2022-10-03 NOTE — ED Provider Notes (Incomplete)
  [facility] Provider Note   CSN: [id number] Arrival date & time: 10/03/22  1943     History {Add pertinent medical, surgical, social history, OB history to HPI:1} Chief Complaint  Patient presents with  . Abscess    Rectal    Eddie Smith is a 42 y.o. male.  HPI     Home Medications Prior to Admission medications   Medication Sig Start Date End Date Taking? Authorizing Provider  aspirin 81 MG chewable tablet Chew 1 tablet (81 mg total) by mouth daily. 04/02/22   Iona Beard, MD  ondansetron (ZOFRAN-ODT) 4 MG disintegrating tablet Take 1 tablet (4 mg total) by mouth every 4 (four) hours as needed for nausea or vomiting. 07/04/22   Charlesetta Shanks, MD      Allergies    Patient has no known allergies.    Review of Systems   Review of Systems  Physical Exam Updated Vital Signs BP 110/81   Pulse 93   Temp 98.8 F (37.1 C) (Oral)   Resp 16   SpO2 97%  Physical Exam  ED Results / Procedures / Treatments   Labs (all labs ordered are listed, but only abnormal results are displayed) Labs Reviewed  CBC WITH DIFFERENTIAL/PLATELET - Abnormal; Notable for the following components:      Result Value   Platelets 409 (*)    All other components within normal limits  LACTIC ACID, PLASMA  LACTIC ACID, PLASMA  COMPREHENSIVE METABOLIC PANEL    EKG None  Radiology No results found.  Procedures Procedures  {Document cardiac monitor, telemetry assessment procedure when appropriate:1}  Medications Ordered in ED Medications - No data to display  ED Course/ Medical Decision Making/ A&P   {   Click here for ABCD2, HEART and other calculatorsREFRESH Note before signing :1}                          Medical Decision Making  ***  {Document critical care time when appropriate:1} {Document review of labs and clinical decision tools ie heart score, Chads2Vasc2 etc:1}  {Document your independent review of radiology  images, and any outside records:1} {Document your discussion with family members, caretakers, and with consultants:1} {Document social determinants of health affecting pt's care:1} {Document your decision making why or why not admission, treatments were needed:1} Final Clinical Impression(s) / ED Diagnoses Final diagnoses:  None    Rx / DC Orders ED Discharge Orders     None

## 2022-10-03 NOTE — ED Triage Notes (Signed)
Pt states concern for abscess to rectum. States noticing two weeks ago. Has grown and is now draining white fluid. Also states concern for left pinky swelling without injury.   Area not visualized in triage.

## 2022-10-03 NOTE — ED Provider Triage Note (Signed)
Emergency Medicine Provider Triage Evaluation Note  Avin Jamorian Hatter , a 42 y.o. male  was evaluated in triage.  Pt complains of rectal pain. Discomfort to rectal region x 2 weeks.  Noticing white spots and drainage.  Report foot fungus to L foot for 4 days. No fever, chills, no trauma, no same sex activity  Review of Systems  Positive: As above Negative: As above  Physical Exam  BP 110/81   Pulse 93   Temp 98.8 F (37.1 C) (Oral)   Resp 16   SpO2 97%  Gen:   Awake, no distress   Resp:  Normal effort  MSK:   Moves extremities without difficulty  Other:    Medical Decision Making  Medically screening exam initiated at 8:05 PM.  Appropriate orders placed.  [employer] was informed that the remainder of the evaluation will be completed by another provider, this initial triage assessment does not replace that evaluation, and the importance of remaining in the ED until their evaluation is complete.     Domenic Moras, PA-C 10/03/22 2006

## 2022-10-04 LAB — RAPID HIV SCREEN (HIV 1/2 AB+AG)
HIV 1/2 Antibodies: NONREACTIVE
HIV-1 P24 Antigen - HIV24: NONREACTIVE

## 2022-10-04 LAB — GC/CHLAMYDIA PROBE AMP (~~LOC~~) NOT AT ARMC
Comment: NEGATIVE
Comment: NORMAL

## 2022-10-04 LAB — RPR
RPR Ser Ql: REACTIVE — AB
RPR Titer: 1:128 {titer}

## 2022-10-04 MED ORDER — KETOCONAZOLE 2 % EX CREA: 1.0000 | TOPICAL_CREAM | Freq: Every day | CUTANEOUS | 0 refills | Status: DC

## 2022-10-04 MED ORDER — DOXYCYCLINE HYCLATE 100 MG PO CAPS: 100.0000 mg | ORAL_CAPSULE | Freq: Two times a day (BID) | ORAL | 0 refills | Status: DC

## 2022-10-04 NOTE — Discharge Instructions (Addendum)
1.  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  You need to follow-up with the health department or family doctor to discuss treatment. 2.  You have penile discharge and swelling of your lymph nodes in the groin.  This is consistent with infection likely to be sexually transmitted.  You have been tested for gonorrhea chlamydia and syphilis.  You have also had HIV testing done.  These results will be back within the next several days.  You have been given antibiotics in the emergency department. 3.  Your sexual partner should be valuated for possible sexual transmitted diseases.  Avoid sexual contact until you have completed treatment and all of your test results are back. 4.  You have a fungal infection between your toes.  Put the ketoconazole cream between your toes and put a clean gauze between them.  Change this twice daily.  Continue this until your skin is healed.  Should follow-up with the podiatrist.  With [facility] as soon as possible.

## 2022-10-07 LAB — T.PALLIDUM AB, TOTAL: T Pallidum Abs: REACTIVE — AB

## 2024-02-27 ENCOUNTER — Ambulatory Visit (HOSPITAL_COMMUNITY)
Admission: EM | Admit: 2024-02-27 | Discharge: 2024-02-28 | Payer: Self-pay | Attending: Nurse Practitioner | Admitting: Nurse Practitioner

## 2024-02-27 DIAGNOSIS — F419 Anxiety disorder, unspecified: Secondary | ICD-10-CM | POA: Insufficient documentation

## 2024-02-27 DIAGNOSIS — R9431 Abnormal electrocardiogram [ECG] [EKG]: Secondary | ICD-10-CM | POA: Insufficient documentation

## 2024-02-27 DIAGNOSIS — Z630 Problems in relationship with spouse or partner: Secondary | ICD-10-CM | POA: Insufficient documentation

## 2024-02-27 DIAGNOSIS — Z5900 Homelessness unspecified: Secondary | ICD-10-CM | POA: Insufficient documentation

## 2024-02-27 DIAGNOSIS — R45851 Suicidal ideations: Secondary | ICD-10-CM | POA: Insufficient documentation

## 2024-02-27 DIAGNOSIS — F32A Depression, unspecified: Secondary | ICD-10-CM | POA: Insufficient documentation

## 2024-02-27 DIAGNOSIS — F1090 Alcohol use, unspecified, uncomplicated: Secondary | ICD-10-CM | POA: Insufficient documentation

## 2024-02-27 DIAGNOSIS — I517 Cardiomegaly: Secondary | ICD-10-CM | POA: Insufficient documentation

## 2024-02-27 MED ORDER — HALOPERIDOL 5 MG PO TABS: 5.0000 mg | ORAL_TABLET | Freq: Three times a day (TID) | ORAL | Status: DC | PRN

## 2024-02-27 MED ORDER — LORAZEPAM 2 MG/ML IJ SOLN: 2.0000 mg | Freq: Three times a day (TID) | INTRAMUSCULAR | Status: DC | PRN

## 2024-02-27 MED ORDER — LOPERAMIDE HCL 2 MG PO CAPS: 2.0000 mg | ORAL_CAPSULE | ORAL | Status: DC | PRN

## 2024-02-27 MED ORDER — DIPHENHYDRAMINE HCL 50 MG/ML IJ SOLN: 50.0000 mg | Freq: Three times a day (TID) | INTRAMUSCULAR | Status: DC | PRN

## 2024-02-27 MED ORDER — HALOPERIDOL LACTATE 5 MG/ML IJ SOLN: 10.0000 mg | Freq: Three times a day (TID) | INTRAMUSCULAR | Status: DC | PRN

## 2024-02-27 MED ORDER — ACETAMINOPHEN 325 MG PO TABS
650.0000 mg | ORAL_TABLET | Freq: Four times a day (QID) | ORAL | Status: DC | PRN
  Administered 2024-02-28: 650 mg via ORAL
  Filled 2024-02-27: qty 2

## 2024-02-27 MED ORDER — ONDANSETRON 4 MG PO TBDP: 4.0000 mg | ORAL_TABLET | Freq: Four times a day (QID) | ORAL | Status: DC | PRN

## 2024-02-27 MED ORDER — MAGNESIUM HYDROXIDE 400 MG/5ML PO SUSP: 30.0000 mL | Freq: Every day | ORAL | Status: DC | PRN

## 2024-02-27 MED ORDER — THIAMINE HCL 100 MG/ML IJ SOLN
100.0000 mg | Freq: Once | INTRAMUSCULAR | Status: AC
  Administered 2024-02-28: 100 mg via INTRAMUSCULAR
  Filled 2024-02-27: qty 2

## 2024-02-27 MED ORDER — ADULT MULTIVITAMIN W/MINERALS CH
1.0000 | ORAL_TABLET | Freq: Every day | ORAL | Status: DC
  Administered 2024-02-28: 1 via ORAL
  Filled 2024-02-27: qty 1

## 2024-02-27 MED ORDER — LORAZEPAM 1 MG PO TABS: 1.0000 mg | ORAL_TABLET | Freq: Four times a day (QID) | ORAL | Status: DC | PRN

## 2024-02-27 MED ORDER — HYDROXYZINE HCL 25 MG PO TABS: 25.0000 mg | ORAL_TABLET | Freq: Four times a day (QID) | ORAL | Status: DC | PRN

## 2024-02-27 MED ORDER — TRAZODONE HCL 50 MG PO TABS
50.0000 mg | ORAL_TABLET | Freq: Every evening | ORAL | Status: DC | PRN
  Administered 2024-02-28: 50 mg via ORAL
  Filled 2024-02-27: qty 1

## 2024-02-27 MED ORDER — LORAZEPAM 1 MG PO TABS: 1.0000 mg | ORAL_TABLET | Freq: Every day | ORAL | Status: DC

## 2024-02-27 MED ORDER — DIPHENHYDRAMINE HCL 50 MG PO CAPS: 50.0000 mg | ORAL_CAPSULE | Freq: Three times a day (TID) | ORAL | Status: DC | PRN

## 2024-02-27 MED ORDER — HALOPERIDOL LACTATE 5 MG/ML IJ SOLN: 5.0000 mg | Freq: Three times a day (TID) | INTRAMUSCULAR | Status: DC | PRN

## 2024-02-27 MED ORDER — THIAMINE MONONITRATE 100 MG PO TABS
100.0000 mg | ORAL_TABLET | Freq: Every day | ORAL | Status: DC
  Administered 2024-02-28: 100 mg via ORAL
  Filled 2024-02-27: qty 1

## 2024-02-27 MED ORDER — HYDROXYZINE HCL 25 MG PO TABS: 25.0000 mg | ORAL_TABLET | Freq: Three times a day (TID) | ORAL | Status: DC | PRN

## 2024-02-27 MED ORDER — LORAZEPAM 1 MG PO TABS: 1.0000 mg | ORAL_TABLET | Freq: Three times a day (TID) | ORAL | Status: DC

## 2024-02-27 MED ORDER — ALUM & MAG HYDROXIDE-SIMETH 200-200-20 MG/5ML PO SUSP: 30.0000 mL | ORAL | Status: DC | PRN

## 2024-02-27 MED ORDER — LORAZEPAM 1 MG PO TABS
1.0000 mg | ORAL_TABLET | Freq: Four times a day (QID) | ORAL | Status: DC
  Administered 2024-02-28 (×2): 1 mg via ORAL
  Filled 2024-02-27 (×2): qty 1

## 2024-02-27 MED ORDER — ESCITALOPRAM OXALATE 10 MG PO TABS
10.0000 mg | ORAL_TABLET | Freq: Every day | ORAL | Status: DC
Start: 2024-02-28 — End: 2024-02-28
  Administered 2024-02-28: 10 mg via ORAL
  Filled 2024-02-27: qty 1

## 2024-02-27 MED ORDER — LORAZEPAM 1 MG PO TABS: 1.0000 mg | ORAL_TABLET | Freq: Two times a day (BID) | ORAL | Status: DC

## 2024-02-27 NOTE — Progress Notes (Signed)
   02/27/24 2158  [facility] Triage Screening (Walk-ins at [facility] only)  How Did You Hear About Us ? Legal System  What Is the Reason for Your Visit/Call Today? Pt presents to [facility] as a voluntary walk-in, accompanied by [employer] with complaint of worsening depression and SI, without plan/intent. Pt reports that major life changes is he reason for his depression. Pt states  I'm just going through a lot. Pt reports hx of MDD, but is not prescribed medications or established with outpatient services at this time. Pt is experiencing homelessness and an individual called police this evening as a concern for his well-being. Pt denies prior suicide attempts, psychatric impatient hospitalization and self-injurious behaviors. Pt currently denies HI,AVH.  How Long Has This Been Causing You Problems? 1 wk - 1 month  Have You Recently Had Any Thoughts About Hurting Yourself? Yes  How long ago did you have thoughts about hurting yourself? currently  Are You Planning to Commit Suicide/Harm Yourself At This time? No  Have you Recently Had Thoughts About Hurting Someone Sherral? No  Are You Planning To Harm Someone At This Time? No  Physical Abuse Denies  Verbal Abuse Denies  Sexual Abuse Denies  Exploitation of patient/patient's resources Denies  Self-Neglect Yes, present (Comment)  Are you currently experiencing any auditory, visual or other hallucinations? No  Have You Used Any Alcohol or Drugs in the Past 24 Hours? Yes  What Did You Use and How Much? cocaine and alcohol  Do you have any current medical co-morbidities that require immediate attention? No  Clinician description of patient physical appearance/behavior: flat affect, appears sad/depressed, casually dressed  What Do You Feel Would Help You the Most Today? Treatment for Depression or other mood problem;Housing Assistance;Social Support;Medication(s)  If access to [facility] was not available, would you have sought care in the Emergency Department? Yes   Determination of Need Urgent (48 hours)  Options For Referral Other: Comment;Outpatient Therapy;Medication Management;BH Urgent Care  Determination of Need filed? Yes

## 2024-02-27 NOTE — ED Provider Notes (Signed)
 [facility] Continuous Assessment Admission H&P  Date: 02/27/24 Patient Name: Eddie Smith MRN: [id number] Chief Complaint:  suicidal ideation  Diagnoses:  Final diagnoses:  Suicidal ideation  Alcohol use    HPI: Eddie D. [age] y/o male patient presented to [facility] as a walk in voluntarily accompanied by [employer] with complaints of worsening depression, anxiety symptoms and suicidal ideations without plan or intent since separating from his wife about 3 weeks ago.  Eddie Smith, 29 y.o., male patient seen face to face by this provider and chart reviewed on 02/27/24.  On evaluation Eddie Smith reports that he and his wife separated about 3 weeks ago and he has been homeless, depressed and having suicidal ideation without a specific plan or intent.  Patient states that everything is going wrong since he and his wife separated. patient states that he drank alcohol about 3-4 before he arrived to [facility].  Reports that he has taken medications in the past about 2 years ago and was prescribed Zoloft but states it made him feel jittery.  Patient endorses feeling anxious, depressed, hopeless and worthless, anhedonia, poor sleep and appetite, restless and fidgety.  Patient denies any previous trauma history.  Patient denies any history of seizures. Patient endorses using cocaine.    During evaluation Eddie Smith is sitting in the assessment room in no acute distress. He is alert, oriented x 4, calm, cooperative but very guarded.  His mood is depressed with congruent affect. He has low speech volume and .  Objectively there is no evidence of psychosis/mania or delusional thinking.  Patient is able to converse coherently, goal directed thoughts, no distractibility, or pre-occupation. He also denies suicidal/self-harm/homicidal ideation, psychosis, and paranoia.  Patient answered question appropriately.     Patient be admitted to [facility] continuous observation for  crisis management, safety and stabilization.  Total Time spent with patient: 15 minutes  Musculoskeletal  Strength & Muscle Tone: within normal limits Gait & Station: normal Patient leans: N/A  Psychiatric Specialty Exam  Presentation General Appearance:  Casual  Eye Contact: Fair  Speech: Clear and Coherent  Speech Volume: Normal  Handedness: Right   Mood and Affect  Mood: Depressed  Affect: Depressed   Thought Process  Thought Processes: Coherent  Descriptions of Associations:Intact  Orientation:Full (Time, Place and Person)  Thought Content:WDL  Diagnosis of Schizophrenia or Schizoaffective disorder in past: No   Hallucinations:Hallucinations: None  Ideas of Reference:None  Suicidal Thoughts:Suicidal Thoughts: Yes, Passive SI Passive Intent and/or Plan: Without Intent; Without Plan  Homicidal Thoughts:Homicidal Thoughts: No   Sensorium  Memory: Immediate Fair; Recent Fair; Remote Fair  Judgment: Fair  Insight: Fair   Chartered certified accountant: Fair  Attention Span: Fair  Recall: [employer] of Knowledge: Fair  Language: Fair   Psychomotor Activity  Psychomotor Activity: Psychomotor Activity: Normal   Assets  Assets: Desire for Improvement; Resilience; Physical Health   Sleep  Sleep: Sleep: Poor Number of Hours of Sleep: 3   Nutritional Assessment (For OBS and FBC admissions only) Has the patient had a weight loss or gain of 10 pounds or more in the last 3 months?: No Has the patient had a decrease in food intake/or appetite?: No Does the patient have dental problems?: No Does the patient have eating habits or behaviors that may be indicators of an eating disorder including binging or inducing vomiting?: No Has the patient recently lost weight without trying?: 0    Physical Exam HENT:  Head: Normocephalic.     Nose: Nose normal.  Eyes:     Pupils: Pupils are equal, round, and reactive to  light.  Cardiovascular:     Rate and Rhythm: Normal rate.  Pulmonary:     Effort: Pulmonary effort is normal.  Abdominal:     General: Abdomen is flat.  Musculoskeletal:        General: Normal range of motion.     Cervical back: Normal range of motion.  Skin:    General: Skin is warm.  Neurological:     Mental Status: He is alert and oriented to person, place, and time.  Psychiatric:        Attention and Perception: Attention normal.        Mood and Affect: Mood is depressed.        Speech: Speech is delayed.        Behavior: Behavior is withdrawn.        Thought Content: Thought content is not paranoid or delusional. Thought content includes suicidal ideation. Thought content does not include homicidal ideation. Thought content does not include homicidal or suicidal plan.        Cognition and Memory: Cognition normal.        Judgment: Judgment is impulsive.    Review of Systems  Constitutional: Negative.   HENT: Negative.    Eyes: Negative.   Respiratory: Negative.    Cardiovascular: Negative.   Gastrointestinal: Negative.   Genitourinary: Negative.   Musculoskeletal: Negative.   Skin: Negative.   Neurological: Negative.   Psychiatric/Behavioral:  Positive for depression and suicidal ideas.     Blood pressure 112/80, pulse (!) 101, temperature 99.5 F (37.5 C), temperature source Oral, resp. rate 18, SpO2 96%. There is no height or weight on file to calculate BMI.  Past Psychiatric History: Alcohol abuse  Is the patient at risk to self? Yes  Has the patient been a risk to self in the past 6 months? No .    Has the patient been a risk to self within the distant past? No   Is the patient a risk to others? No   Has the patient been a risk to others in the past 6 months? No   Has the patient been a risk to others within the distant past? No   Past Medical History: Denies any significant medical history  Family History: Denies any family history  Social History:  43 year old male,homeless, unemployed currently separated from his wife with a history of suicidal ideations, alcohol abuse  Last Labs:  No visits with results within 6 Month(s) from this visit.  Latest known visit with results is:  Admission on 10/03/2022, Discharged on 10/04/2022  Component Date Value Ref Range Status   Lactic Acid, Venous 10/03/2022 0.8  0.5 - 1.9 mmol/L Final   Performed at [facility], [address], [place], [place] [address]   Lactic Acid, Venous 10/03/2022 1.2  0.5 - 1.9 mmol/L Final   Performed at [facility], [address] [place], [place] [address]   Sodium 10/03/2022 135  135 - 145 mmol/L Final   Potassium 10/03/2022 4.0  3.5 - 5.1 mmol/L Final   Chloride 10/03/2022 102  98 - 111 mmol/L Final   CO2 10/03/2022 26  22 - 32 mmol/L Final   Glucose, Bld 10/03/2022 97  70 - 99 mg/dL Final   Glucose reference range applies only to samples taken after fasting for at least 8 hours.   BUN  10/03/2022 12  6 - 20 mg/dL Final   Creatinine, Ser 10/03/2022 0.74  0.61 - 1.24 mg/dL Final   Calcium 97/77/7975 8.9  8.9 - 10.3 mg/dL Final   Total Protein 97/77/7975 8.1  6.5 - 8.1 g/dL Final   Albumin 97/77/7975 3.7  3.5 - 5.0 g/dL Final   AST 97/77/7975 20  15 - 41 U/L Final   ALT 10/03/2022 12  0 - 44 U/L Final   Alkaline Phosphatase 10/03/2022 70  38 - 126 U/L Final   Total Bilirubin 10/03/2022 0.4  0.3 - 1.2 mg/dL Final   GFR, Estimated 10/03/2022 >60  >60 mL/min Final   Comment: (NOTE) Calculated using the CKD-EPI Creatinine Equation (2021)    Anion gap 10/03/2022 7  5 - 15 Final   Performed at [facility], [address] [place], [place] [address]   WBC 10/03/2022 6.0  4.0 - 10.5 K/uL Final   RBC 10/03/2022 5.06  4.22 - 5.81 MIL/uL Final   Hemoglobin 10/03/2022 15.3  13.0 - 17.0 g/dL Final   HCT 97/77/7975 48.1  39.0 - 52.0 % Final   MCV 10/03/2022 95.1  80.0 - 100.0 fL Final   MCH 10/03/2022 30.2  26.0  - 34.0 pg Final   MCHC 10/03/2022 31.8  30.0 - 36.0 g/dL Final   RDW 97/77/7975 14.0  11.5 - 15.5 % Final   Platelets 10/03/2022 409 (H)  150 - 400 K/uL Final   nRBC 10/03/2022 0.0  0.0 - 0.2 % Final   Neutrophils Relative % 10/03/2022 47  % Final   Neutro Abs 10/03/2022 2.8  1.7 - 7.7 K/uL Final   Lymphocytes Relative 10/03/2022 33  % Final   Lymphs Abs 10/03/2022 2.0  0.7 - 4.0 K/uL Final   Monocytes Relative 10/03/2022 15  % Final   Monocytes Absolute 10/03/2022 0.9  0.1 - 1.0 K/uL Final   Eosinophils Relative 10/03/2022 3  % Final   Eosinophils Absolute 10/03/2022 0.2  0.0 - 0.5 K/uL Final   Basophils Relative 10/03/2022 1  % Final   Basophils Absolute 10/03/2022 0.1  0.0 - 0.1 K/uL Final   Immature Granulocytes 10/03/2022 1  % Final   Abs Immature Granulocytes 10/03/2022 0.03  0.00 - 0.07 K/uL Final   Performed at [facility], [address], [place], [place] [address]   Neisseria Gonorrhea 10/03/2022 Other   Final   Chlamydia 10/03/2022 Other   Final   Molecular Comment 10/03/2022 Recommend recollection and testing.   Final   Comment 10/03/2022 Normal Reference Ranger Chlamydia - Negative   Final   Comment 10/03/2022 Normal Reference Range Neisseria Gonorrhea - Negative   Final   RPR Ser Ql 10/03/2022 Reactive (A)  NON REACTIVE Final   SENT FOR CONFIRMATION   RPR Titer 10/03/2022 1:128   Final   Performed at [facility], [address], [place], [place] [address]   HIV-1 P24 Antigen - HIV24 10/03/2022 NON REACTIVE  NON REACTIVE Final   Comment: (NOTE) Detection of p24 may be inhibited by biotin in the sample, causing false negative results in acute infection.    HIV 1/2 Antibodies 10/03/2022 NON REACTIVE  NON REACTIVE Final   Interpretation (HIV Ag Ab) 10/03/2022 A non reactive test result means that HIV 1 or HIV 2 antibodies and HIV 1 p24 antigen were not detected in the specimen.   Final   Comment: RESULT CALLED TO, READ BACK BY AND VERIFIED  WITH: HARPER,J RN AT [phone]  ON 10/04/22 BY [email or web address] Performed at [facility], [address] [place], [place] [address]    T Pallidum Abs 10/03/2022 Reactive (A)  Non Reactive Final   Comment: (NOTE) Performed At: [facility] [address] [place], [place] [id number] Jennette Shorter MD Ey:[id number]     Allergies: Patient has no known allergies.  Medications:  Facility Ordered Medications  Medication   acetaminophen  (TYLENOL ) tablet 650 mg   alum & mag hydroxide-simeth (MAALOX/MYLANTA) 200-200-20 MG/5ML suspension 30 mL   magnesium  hydroxide (MILK OF MAGNESIA) suspension 30 mL   haloperidol  (HALDOL ) tablet 5 mg   And   diphenhydrAMINE  (BENADRYL ) capsule 50 mg   haloperidol  lactate (HALDOL ) injection 5 mg   And   diphenhydrAMINE  (BENADRYL ) injection 50 mg   And   LORazepam  (ATIVAN ) injection 2 mg   haloperidol  lactate (HALDOL ) injection 10 mg   And   diphenhydrAMINE  (BENADRYL ) injection 50 mg   And   LORazepam  (ATIVAN ) injection 2 mg   traZODone  (DESYREL ) tablet 50 mg   hydrOXYzine  (ATARAX ) tablet 25 mg   [START ON 02/28/2024] escitalopram  (LEXAPRO ) tablet 10 mg   PTA Medications  Medication Sig   aspirin  81 MG chewable tablet Chew 1 tablet (81 mg total) by mouth daily.   doxycycline  (VIBRAMYCIN ) 100 MG capsule Take 1 capsule (100 mg total) by mouth 2 (two) times daily.   ketoconazole  (NIZORAL ) 2 % cream Apply 1 Application topically daily.   ondansetron  (ZOFRAN -ODT) 4 MG disintegrating tablet Take 1 tablet (4 mg total) by mouth every 4 (four) hours as needed for nausea or vomiting.      Medical Decision Making   Deloy D. Cardell 43 y/o male patient presented to [facility] as a walk in voluntarily accompanied by [employer] with complaints of worsening depression, anxiety symptoms and suicidal ideations without plan or intent.    Recommendations  Based on my evaluation the patient does not appear to have an emergency medical condition.  Patient be  admitted to [facility] continuous observation for crisis management, safety and stabilization.  Amiere Cawley E Shanira Tine, NP 02/27/24  11:11 PM

## 2024-02-27 NOTE — BH Assessment (Incomplete)
 Comprehensive Clinical Assessment (CCA) Note  02/27/2024 Teigen Donte Deren [id number]  Disposition: Roxianne Olp, NP recommends pt to be admitted to [facility].   The patient demonstrates the following risk factors for suicide: Chronic risk factors for suicide include: {Chronic Risk Factors for Dlprpiz:69585988}. Acute risk factors for suicide include: {Acute Risk Factors for Dlprpiz:69585987}. Protective factors for this patient include: {Protective Factors for Suicide Mpdx:69585986}. Considering these factors, the overall suicide risk at this point appears to be {Desc; low/moderate/high:110033}. Patient {ACTION; IS/IS WNU:78978602} appropriate for outpatient follow up.  Slayter Salik Grewell is a 43 year old male who presents voluntary and unaccompanied to [facility] ([facility]). Clinician asked the pt, what brought you to the hospital? Pt reports, over that past over of days he been depressed and passively suicidal with no plan, intent or means. Pt reports, 3-4  weeks ago, losing his relationship with his wife, pt is a homeless (has been staying with friends, anywhere). Pt was guarded when has specifically what happened with his wife, stating he was a lot. Pt reports, everything is going wrong, no money no job. Pt denies, HI, hallucinations, self-injurious behaviors and access to weapons.   Pt reports, he started using Cocaine 3-4 weeks ago, he uses all the time. Per pt, he last used 3-4 hours ago. Pt reports, drinking Vodka an hour ago. Pt denies, linked linked to OPT resources (medication management and/or counseling.)   Pt presents guarded in causal attire and normal speech. Pt's mood, affect was depressed. Pt's insight, judgement poor.   Chief Complaint:  Chief Complaint  Patient presents with  . Suicidal Ideation  . Depression   Visit Diagnosis: ***    CCA Screening, Triage and Referral (STR)  Patient Reported Information How did you hear  about us ? Legal System  What Is the Reason for Your Visit/Call Today? Pt presents to [facility] as a voluntary walk-in, accompanied by [employer] with complaint of worsening depression and SI, without plan/intent. Pt reports that major life changes is he reason for his depression. Pt states  I'm just going through a lot. Pt reports hx of MDD, but is not prescribed medications or established with outpatient services at this time. Pt is experiencing homelessness and an individual called police this evening as a concern for his well-being. Pt denies prior suicide attempts, psychatric impatient hospitalization and self-injurious behaviors. Pt currently denies HI,AVH.  How Long Has This Been Causing You Problems? 1 wk - 1 month  What Do You Feel Would Help You the Most Today? Treatment for Depression or other mood problem; Housing Assistance; Social Support; Medication(s)   Have You Recently Had Any Thoughts About Hurting Yourself? Yes  Are You Planning to Commit Suicide/Harm Yourself At This time? No   Flowsheet Row ED from 02/27/2024 in [facility] ED from 10/03/2022 in [facility] Emergency Department at [facility] ED to Hosp-Admission (Discharged) from 03/27/2022 in [place] [facility]  C-SSRS RISK CATEGORY Low Risk No Risk No Risk    Have you Recently Had Thoughts About Hurting Someone Sherral? No  Are You Planning to Harm Someone at This Time? No  Explanation: NA   Have You Used Any Alcohol or Drugs in the Past 24 Hours? Yes  How Long Ago Did You Use Drugs or Alcohol? No data recorded What Did You Use and How Much? cocaine and alcohol   Do You Currently Have a Therapist/Psychiatrist? No  Name of Therapist/Psychiatrist:    Have You  Been Recently Discharged From Any Office Practice or Programs? No  Explanation of Discharge From Practice/Program: No data recorded    CCA Screening Triage Referral Assessment Type of Contact:  Face-to-Face  Telemedicine Service Delivery:   Is this Initial or Reassessment?   Date Telepsych consult ordered in [facility]:    Time Telepsych consult ordered in CHL:    Location of Assessment: [facility]  Provider Location: [facility] Services   Collateral Involvement: NA   Does Patient Have a Automotive engineer Guardian? No  Legal Guardian Contact Information: NA  Copy of Legal Guardianship Form: No - copy requested  Legal Guardian Notified of Arrival: Attempted notification unsuccessful  Legal Guardian Notified of Pending Discharge: Attempted notification unsuccessful  If Minor and Not Living with Parent(s), Who has Custody? Pt is an adult.  Is CPS involved or ever been involved? Never  Is APS involved or ever been involved? Never   Patient Determined To Be At Risk for Harm To Self or Others Based on Review of Patient Reported Information or Presenting Complaint? Yes, for Self-Harm  Method: No Plan  Availability of Means: No access or NA  Intent: Vague intent or NA  Notification Required: No need or identified person  Additional Information for Danger to Others Potential: -- (NA)  Additional Comments for Danger to Others Potential: NA  Are There Guns or Other Weapons in Your Home? No  Types of Guns/Weapons: Pt denies.  Are These Weapons Safely Secured?                            -- (NA)  Who Could Verify You Are Able To Have These Secured: NA  Do You Have any Outstanding Charges, Pending Court Dates, Parole/Probation? Pt denies.  Contacted To Inform of Risk of Harm To Self or Others: Other: Comment (NA)    Does Patient Present under Involuntary Commitment? No    [place] of Residence: [place]   Patient Currently Receiving the Following Services: Not Receiving Services   Determination of Need: Urgent (48 hours)   Options For Referral: Other: Comment; Outpatient Therapy; Medication Management; [facility]     CCA  Biopsychosocial Patient Reported Schizophrenia/Schizoaffective Diagnosis in Past: No   Strengths: Pt wants help.   Mental Health Symptoms Depression:  Fatigue; Hopelessness; Worthlessness; Sleep (too much or little); Increase/decrease in appetite; Irritability; Difficulty Concentrating   Duration of Depressive symptoms: Duration of Depressive Symptoms: Greater than two weeks   Mania:  None   Anxiety:   Worrying; Irritability; Difficulty concentrating; Fatigue; Restlessness   Psychosis:  None   Duration of Psychotic symptoms:    Trauma:  None   Obsessions:  None   Compulsions:  None   Inattention:  Forgetful   Hyperactivity/Impulsivity:  Feeling of restlessness; Fidgets with hands/feet   Oppositional/Defiant Behaviors:  None   Emotional Irregularity:  Recurrent suicidal behaviors/gestures/threats   Other Mood/Personality Symptoms:  NA    Mental Status Exam Appearance and self-care  Stature:  -- (Short.)   Weight:  Average weight   Clothing:  Casual   Grooming:  Normal   Cosmetic use:  None   Posture/gait:  Normal   Motor activity:  Not Remarkable   Sensorium  Attention:  Normal   Concentration:  Normal   Orientation:  X5   Recall/memory:  Defective in Immediate   Affect and Mood  Affect:  Congruent   Mood:  No data recorded  Relating  Eye contact:  Normal   Facial expression:  Responsive   Attitude toward examiner:  Guarded   Thought and Language  Speech flow: Normal   Thought content:  Appropriate to Mood and Circumstances   Preoccupation:  None   Hallucinations:  None   Organization:  Circumstantial   Company secretary of Knowledge:  Poor   Intelligence:  Average   Abstraction:  Functional   Judgement:  Poor   Reality Testing:  Adequate   Insight:  Poor   Decision Making:  Impulsive   Social Functioning  Social Maturity:  Impulsive   Social Judgement:  Chief of Staff   Stress  Stressors:  Relationship;  Financial; Work   Coping Ability:  Overwhelmed; Deficient supports   Skill Deficits:  Communication; Responsibility; Interpersonal   Supports:  Support needed     Religion: Religion/Spirituality Are You A Religious Person?: Yes What is Your Religious Affiliation?: Baptist How Might This Affect Treatment?: NA  Leisure/Recreation: Leisure / Recreation Do You Have Hobbies?: Yes Leisure and Hobbies: Work.  Exercise/Diet: Exercise/Diet Do You Exercise?: No Have You Gained or Lost A Significant Amount of Weight in the Past Six Months?: No Do You Follow a Special Diet?: No Do You Have Any Trouble Sleeping?: Yes Explanation of Sleeping Difficulties: Pt reports, getting little sleep.   CCA Employment/Education Employment/Work Situation: Employment / Work Situation Employment Situation: Unemployed Patient's Job has Been Impacted by Current Illness: No Has Patient ever Been in Equities trader?: No  Education: Education Is Patient Currently Attending School?: No Last Grade Completed: 12 Did You Product manager?: No Did You Have An Individualized Education Program (IIEP): No Did You Have Any Difficulty At [employer]?: No Patient's Education Has Been Impacted by Current Illness: No   CCA Family/Childhood History Family and Relationship History: Family history Marital status: Single Does patient have children?: Yes How many children?: 6 How is patient's relationship with their children?: Pt reports, he hasn't seen his kids in weeks since the break up with his wife.  Childhood History:  Childhood History By whom was/is the patient raised?: Mother Did patient suffer any verbal/emotional/physical/sexual abuse as a child?: No Did patient suffer from severe childhood neglect?: No Has patient ever been sexually abused/assaulted/raped as an adolescent or adult?: No Was the patient ever a victim of a crime or a disaster?: No Witnessed domestic violence?: No Has patient been affected  by domestic violence as an adult?: No       CCA Substance Use Alcohol/Drug Use: Alcohol / Drug Use Pain Medications: See MAR Prescriptions: See MAR Over the Counter: See MAR History of alcohol / drug use?: Yes Longest period of sobriety (when/how long): UTA Negative Consequences of Use: Personal relationships, Financial Withdrawal Symptoms: None Substance #1 Name of Substance 1: Cocaine. 1 - Age of First Use: Pt reports, he started using 3-4 weeks ago. 1 - Amount (size/oz): Per pt, Not sure. 1 - Frequency: Per pt, All the time. 1 - Duration: Ongoing. 1 - Last Use / Amount: Per pt, 3-4 hours ago. 1 - Method of Aquiring: UTA 1- Route of Use: Snort.                       ASAM's:  Six Dimensions of Multidimensional Assessment  Dimension 1:  Acute Intoxication and/or Withdrawal Potential:   Dimension 1:  Description of individual's past and current experiences of substance use and withdrawal: None.  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  Dimension 3:  Emotional, Behavioral, or Cognitive Conditions and Complications:  Dimension 3:  Description of emotional, behavioral, or cognitive conditions and complications: Pt has the following previous diagnoses: Cocaine use Disorder, Alcohol abuse, Depression.  Dimension 4:  Readiness to Change:  Dimension 4:  Description of Readiness to Change criteria: Pt reports, he wants help.  Dimension 5:  Relapse, Continued use, or Continued Problem Potential:  Dimension 5:  Relapse, continued use, or continued problem potential critiera description: Pt reports, she started using Cocaine 3-4 weeks ago and uses it all the time.  Dimension 6:  Recovery/Living Environment:  Dimension 6:   Recovery/Iiving environment criteria description: Pt reports, he's homeless since the fall out with his wife (3-4 weeks ago.)  ASAM Severity Score: ASAM's Severity Rating Score: 11  ASAM Recommended Level of Treatment: ASAM Recommended Level of Treatment: Level II Intensive Outpatient Treatment   Substance use Disorder (SUD) Substance Use Disorder (SUD)  Checklist Symptoms of Substance Use: Continued use despite having a persistent/recurrent physical/psychological problem caused/exacerbated by use, Continued use despite persistent or recurrent social, interpersonal problems, caused or exacerbated by use  Recommendations for Services/Supports/Treatments: Recommendations for Services/Supports/Treatments Recommendations For Services/Supports/Treatments: Other (Comment) (Pt to be [facility] to [facility] for Observation.)  Disposition Recommendation per psychiatric provider: {CHLmaccldispo:31820}   DSM5 Diagnoses: Patient Active Problem List   Diagnosis Date Noted  . 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 Cocaine use   . CVA (cerebral vascular accident) (HCC) 03/27/2022  . Atypical chest pain 03/27/2022  . Acute encephalopathy 01/23/2022  . PNA (pneumonia) 01/21/2022  . Syncope 01/21/2022  . Drug overdose 01/21/2022  . Alcohol abuse 01/21/2022  . Depression 01/21/2022  . Polysubstance use disorder 01/21/2022  . Tobacco abuse 01/21/2022  . History of acute prostatitis 04/21/2012  . Seasonal allergies 04/03/2012  . Hypertension 04/03/2012  . Hx of migraines 04/03/2012  . Sleep apnea, obstructive 04/03/2012  . GERD (gastroesophageal reflux disease)      Referrals to Alternative Service(s): Referred to Alternative Service(s):   Place:   Date:   Time:    Referred to Alternative Service(s):   Place:   Date:   Time:    Referred to Alternative Service(s):   Place:   Date:   Time:    Referred to Alternative Service(s):   Place:   Date:   Time:     Jackson JONETTA Broach, [facility] Comprehensive Clinical Assessment (CCA) Screening, Triage and Referral Note  02/27/2024 Christ Gwyn Mehring [id number]  Chief Complaint:  Chief Complaint  Patient presents with  . Suicidal Ideation  . Depression   Visit Diagnosis: ***  Patient Reported Information How did you hear about us ? Legal System  What Is the Reason for Your Visit/Call Today? Pt presents to [facility] as a voluntary walk-in, accompanied by [employer] with complaint of worsening depression and SI, without plan/intent. Pt reports that major life changes is he reason for his depression. Pt states  I'm just going through a lot. Pt reports hx of MDD, but is not prescribed medications or established with outpatient services at this time. Pt is experiencing homelessness and an individual called police this evening as a concern for his well-being. Pt denies prior suicide attempts, psychatric impatient hospitalization and self-injurious behaviors. Pt currently denies HI,AVH.  How Long Has This Been  Causing You Problems? 1 wk - 1 month  What Do You Feel Would Help You the Most Today? Treatment for Depression or other mood problem; Housing Assistance; Social Support; Medication(s)   Have You Recently Had Any Thoughts About Hurting Yourself? Yes  Are You Planning to Commit Suicide/Harm Yourself At This time? No   Have you Recently Had Thoughts About Hurting Someone Sherral? No  Are You Planning to Harm Someone at This Time? No  Explanation: NA   Have You Used Any Alcohol or Drugs in the Past 24 Hours? Yes  How Long Ago Did You Use Drugs or Alcohol? No data recorded What Did You Use and How Much? cocaine and alcohol   Do You Currently Have a Therapist/Psychiatrist? No  Name of Therapist/Psychiatrist: No data recorded  Have You Been Recently Discharged From Any Office Practice or Programs? No  Explanation of Discharge From Practice/Program: No data recorded   CCA Screening Triage Referral  Assessment Type of Contact: Face-to-Face  Telemedicine Service Delivery:   Is this Initial or Reassessment?   Date Telepsych consult ordered in [facility]:    Time Telepsych consult ordered in [facility]L:    Location of Assessment: [facility]  Provider Location: [facility] Services    Collateral Involvement: NA   Does Patient Have a Automotive engineer Guardian? No data recorded Name and Contact of Legal Guardian: No data recorded If Minor and Not Living with Parent(s), Who has Custody? Pt is an adult.  Is CPS involved or ever been involved? Never  Is APS involved or ever been involved? Never   Patient Determined To Be At Risk for Harm To Self or Others Based on Review of Patient Reported Information or Presenting Complaint? Yes, for Self-Harm  Method: No Plan  Availability of Means: No access or NA  Intent: Vague intent or NA  Notification Required: No need or identified person  Additional Information for Danger to Others Potential: -- (NA)  Additional Comments for Danger to Others Potential: NA  Are There Guns or Other Weapons in Your Home? No  Types of Guns/Weapons: Pt denies.  Are These Weapons Safely Secured?                            -- (NA)  Who Could Verify You Are Able To Have These Secured: NA  Do You Have any Outstanding Charges, Pending Court Dates, Parole/Probation? Pt denies.  Contacted To Inform of Risk of Harm To Self or Others: Other: Comment (NA)   Does Patient Present under Involuntary Commitment? No    [place] of Residence: [place]   Patient Currently Receiving the Following Services: Not Receiving Services   Determination of Need: Urgent (48 hours)   Options For Referral: Other: Comment; Outpatient Therapy; Medication Management; [facility]   Disposition Recommendation per psychiatric provider: {CHLmaccldispo:31820}  Jackson JONETTA Broach, [facility]

## 2024-02-27 NOTE — BH Assessment (Signed)
 Comprehensive Clinical Assessment (CCA) Note  02/28/2024 Eddie Smith [id number]  Disposition: Roxianne Olp, NP recommends pt to be admitted to [facility]   The patient demonstrates the following risk factors for suicide: Chronic risk factors for suicide include: psychiatric disorder of Unspecified Depressive Disorder and substance use disorder. Acute risk factors for suicide include: Passive suicidal ideations. Protective factors for this patient include: Pt denies, plan, intent or means. Considering these factors, the overall suicide risk at this point appears to be low. Patient is not appropriate for outpatient follow up.  Eddie Smith is a 44 year old male who presents voluntary and unaccompanied to [facility] ([facility] Clinician asked the pt, what brought you to the hospital? Pt reports, over the last couple of days he's been depressed and passively suicidal with no plan, intent or means. Pt reports, 3-4 weeks ago, losing his relationship with his wife, pt is a homeless (has been staying with friends, anywhere). Pt was guarded when asked specifically what happened with his wife. Pt stated it was a lot. Pt reports, everything is going wrong; no money, no job. Pt denies, HI, hallucinations, self-injurious behaviors and access to weapons.   Pt reports, he started using Cocaine 3-4 weeks ago, he uses all the time. Per pt, he last used 3-4 hours ago. Pt reports, drinking Vodka an hour ago. Pt denies, linked to OPT resources (medication management and/or counseling.)   During the assessment pt presents guarded in causal attire and normal speech. Pt would not expound on many questions when asked. Pt's mood, affect was depressed. Pt's insight, judgement poor. Pt reports, he wants help.   Chief Complaint:  Chief Complaint  Patient presents with   Suicidal Ideation   Depression   Visit Diagnosis: Unspecified Depressive Disorder.                                Cocaine use Disorder.    CCA Screening, Triage and Referral (STR)  Patient Reported Information How did you hear about us ? Legal System  What Is the Reason for Your Visit/Call Today? Pt presents to [facility] as a voluntary walk-in, accompanied by [employer] with complaint of worsening depression and SI, without plan/intent. Pt reports that major life changes is he reason for his depression. Pt states  I'm just going through a lot. Pt reports hx of MDD, but is not prescribed medications or established with outpatient services at this time. Pt is experiencing homelessness and an individual called police this evening as a concern for his well-being. Pt denies prior suicide attempts, psychatric impatient hospitalization and self-injurious behaviors. Pt currently denies HI,AVH.  How Long Has This Been Causing You Problems? 1 wk - 1 month  What Do You Feel Would Help You the Most Today? Treatment for Depression or other mood problem; Housing Assistance; Social Support; Medication(s)   Have You Recently Had Any Thoughts About Hurting Yourself? Yes  Are You Planning to Commit Suicide/Harm Yourself At This time? No   Flowsheet Row ED from 02/27/2024 in [facility] ED from 10/03/2022 in [facility] Department [facility] ED to Hosp-Admission (Discharged) from 03/27/2022 in [facility]  C-SSRS RISK CATEGORY Low Risk No Risk No Risk    Have you Recently Had Thoughts About Hurting Someone Sherral? No  Are You Planning to Harm Someone at This Time? No  Explanation: NA   Have  You Used Any Alcohol or Drugs in the Past 24 Hours? Yes  How Long Ago Did You Use Drugs or Alcohol? No data recorded What Did You Use and How Much? cocaine and alcohol   Do You Currently Have a Therapist/Psychiatrist? No  Name of Therapist/Psychiatrist:    Have You Been Recently Discharged From Any Office Practice or Programs? No  Explanation of  Discharge From Practice/Program: No data recorded    CCA Screening Triage Referral Assessment Type of Contact: Face-to-Face  Telemedicine Service Delivery:   Is this Initial or Reassessment?   Date Telepsych consult ordered in [facility]:    Time Telepsych consult ordered in [facility]:    Location of Assessment: [facility]  Provider Location: [facility] Assessment Services   Collateral Involvement: NA   Does Patient Have a Automotive engineer Guardian? No  Legal Guardian Contact Information: NA  Copy of Legal Guardianship Form: No - copy requested  Legal Guardian Notified of Arrival: Attempted notification unsuccessful  Legal Guardian Notified of Pending Discharge: Attempted notification unsuccessful  If Minor and Not Living with Parent(s), Who has Custody? Pt is an adult.  Is CPS involved or ever been involved? Never  Is APS involved or ever been involved? Never   Patient Determined To Be At Risk for Harm To Self or Others Based on Review of Patient Reported Information or Presenting Complaint? Yes, for Self-Harm  Method: No Plan  Availability of Means: No access or NA  Intent: Vague intent or NA  Notification Required: No need or identified person  Additional Information for Danger to Others Potential: -- (NA)  Additional Comments for Danger to Others Potential: NA  Are There Guns or Other Weapons in Your Home? No  Types of Guns/Weapons: Pt denies.  Are These Weapons Safely Secured?                            -- (NA)  Who Could Verify You Are Able To Have These Secured: NA  Do You Have any Outstanding Charges, Pending Court Dates, Parole/Probation? Pt denies.  Contacted To Inform of Risk of Harm To Self or Others: Other: Comment (NA)    Does Patient Present under Involuntary Commitment? No    [place] of Residence: [place]   Patient Currently Receiving the Following Services: Not Receiving Services   Determination of Need: Urgent (48  hours)   Options For Referral: Other: Comment; Outpatient Therapy; Medication Management; [facility]     CCA Biopsychosocial Patient Reported Schizophrenia/Schizoaffective Diagnosis in Past: No   Strengths: Pt wants help.   Mental Health Symptoms Depression:  Fatigue; Hopelessness; Worthlessness; Sleep (too much or little); Increase/decrease in appetite; Irritability; Difficulty Concentrating   Duration of Depressive symptoms: Duration of Depressive Symptoms: Greater than two weeks   Mania:  None   Anxiety:   Worrying; Irritability; Difficulty concentrating; Fatigue; Restlessness   Psychosis:  None   Duration of Psychotic symptoms:    Trauma:  None   Obsessions:  None   Compulsions:  None   Inattention:  Forgetful   Hyperactivity/Impulsivity:  Feeling of restlessness; Fidgets with hands/feet   Oppositional/Defiant Behaviors:  None   Emotional Irregularity:  Recurrent suicidal behaviors/gestures/threats   Other Mood/Personality Symptoms:  NA    Mental Status Exam Appearance and self-care  Stature:  -- (Short.)   Weight:  Average weight   Clothing:  Casual   Grooming:  Normal   Cosmetic use:  None  Posture/gait:  Normal   Motor activity:  Not Remarkable   Sensorium  Attention:  Normal   Concentration:  Normal   Orientation:  X5   Recall/memory:  Defective in Immediate   Affect and Mood  Affect:  Depressed   Mood:  Depressed   Relating  Eye contact:  Normal   Facial expression:  Responsive   Attitude toward examiner:  Guarded   Thought and Language  Speech flow: Normal   Thought content:  Appropriate to Mood and Circumstances   Preoccupation:  None   Hallucinations:  None   Organization:  Circumstantial   Company secretary of Knowledge:  Poor   Intelligence:  Average   Abstraction:  Functional   Judgement:  Poor   Reality Testing:  Adequate   Insight:  Poor   Decision Making:  Impulsive   Social  Functioning  Social Maturity:  Impulsive   Social Judgement:  Chief of Staff   Stress  Stressors:  Relationship; Financial; Work   Coping Ability:  Overwhelmed; Deficient supports   Skill Deficits:  Communication; Responsibility; Interpersonal   Supports:  Support needed     Religion: Religion/Spirituality Are You A Religious Person?: Yes What is Your Religious Affiliation?: Baptist How Might This Affect Treatment?: NA  Leisure/Recreation: Leisure / Recreation Do You Have Hobbies?: Yes Leisure and Hobbies: Work.  Exercise/Diet: Exercise/Diet Do You Exercise?: No Have You Gained or Lost A Significant Amount of Weight in the Past Six Months?: No Do You Follow a Special Diet?: No Do You Have Any Trouble Sleeping?: Yes Explanation of Sleeping Difficulties: Pt reports, getting little sleep.   CCA Employment/Education Employment/Work Situation: Employment / Work Situation Employment Situation: Unemployed Patient's Job has Been Impacted by Current Illness: No Has Patient ever Been in Equities trader?: No  Education: Education Is Patient Currently Attending School?: No Last Grade Completed: 12 Did You Product manager?: No Did You Have An Individualized Education Program (IIEP): No Did You Have Any Difficulty At [employer]?: No Patient's Education Has Been Impacted by Current Illness: No   CCA Family/Childhood History Family and Relationship History: Family history Marital status: Married Number of Years Married: 7 What types of issues is patient dealing with in the relationship?: Pt reports, losing his relationship with his wife. Additional relationship information: NA Does patient have children?: Yes How many children?: 6 How is patient's relationship with their children?: Pt reports, he hasn't seen his kids in weeks since the break up with his wife.  Childhood History:  Childhood History By whom was/is the patient raised?: Mother Did patient suffer any  verbal/emotional/physical/sexual abuse as a child?: No Did patient suffer from severe childhood neglect?: No Has patient ever been sexually abused/assaulted/raped as an adolescent or adult?: No Was the patient ever a victim of a crime or a disaster?: No Witnessed domestic violence?: No Has patient been affected by domestic violence as an adult?: No   CCA Substance Use Alcohol/Drug Use: Alcohol / Drug Use Pain Medications: See MAR Prescriptions: See MAR Over the Counter: See MAR History of alcohol / drug use?: Yes Longest period of sobriety (when/how long): UTA Negative Consequences of Use: Personal relationships, Financial Withdrawal Symptoms: None Substance #1 Name of Substance 1: Cocaine. 1 - Age of First Use: Pt reports, he started using 3-4 weeks ago. 1 - Amount (size/oz): Per pt, Not sure. 1 - Frequency: Per pt, All the time. 1 - Duration: Ongoing. 1 - Last Use / Amount: Per pt, 3-4 hours ago. 1 - Method of  Aquiring: UTA 1- Route of Use: Snort.    ASAM's:  Six Dimensions of Multidimensional Assessment  Dimension 1:  Acute Intoxication and/or Withdrawal Potential:   Dimension 1:  Description of individual's past and current experiences of substance use and withdrawal: None.  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  Dimension 3:  Emotional, Behavioral, or Cognitive Conditions and Complications:  Dimension 3:  Description of emotional, behavioral, or cognitive conditions and complications: Pt has the following previous diagnoses: Cocaine use Disorder, Alcohol abuse, Depression.  Dimension 4:  Readiness to Change:  Dimension 4:  Description of Readiness to Change criteria: Pt reports, he wants help.  Dimension 5:  Relapse,  Continued use, or Continued Problem Potential:  Dimension 5:  Relapse, continued use, or continued problem potential critiera description: Pt reports, she started using Cocaine 3-4 weeks ago and uses it all the time.  Dimension 6:  Recovery/Living Environment:  Dimension 6:  Recovery/Iiving environment criteria description: Pt reports, he's homeless since the fall out with his wife (3-4 weeks ago.)  ASAM Severity Score: ASAM's Severity Rating Score: 11  ASAM Recommended Level of Treatment: ASAM Recommended Level of Treatment: Level II Intensive Outpatient Treatment   Substance use Disorder (SUD) Substance Use Disorder (SUD)  Checklist Symptoms of Substance Use: Continued use despite having a persistent/recurrent physical/psychological problem caused/exacerbated by use, Continued use despite persistent or recurrent social, interpersonal problems, caused or exacerbated by use  Recommendations for Services/Supports/Treatments: Recommendations for Services/Supports/Treatments Recommendations For Services/Supports/Treatments: Other (Comment) (Pt to be admitted to [facility] for Observation.)  Disposition Recommendation per psychiatric provider: Pt to be admitted to [facility].    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      Referrals to Alternative  Service(s): Referred to Alternative Service(s):   Place:   Date:   Time:    Referred to Alternative Service(s):   Place:   Date:   Time:    Referred to Alternative Service(s):   Place:   Date:   Time:    Referred to Alternative Service(s):   Place:   Date:   Time:     Jackson JONETTA Broach, [facility] Comprehensive Clinical Assessment (CCA) Screening, Triage and Referral Note  02/28/2024 Eddie Smith [id number]  Chief Complaint:  Chief Complaint  Patient presents with   Suicidal Ideation   Depression   Visit Diagnosis:   Patient Reported Information How did you hear about us ? Legal System  What Is the Reason for Your Visit/Call Today? Pt presents to [facility] as a voluntary walk-in, accompanied by [employer] with complaint of worsening depression and SI, without plan/intent. Pt reports that major life changes is he reason for his depression. Pt states  I'm just going through a lot. Pt reports hx of MDD, but is not prescribed medications or established with outpatient services at this time. Pt is experiencing homelessness and an individual called police this evening as a concern for his well-being. Pt denies prior suicide attempts, psychatric impatient hospitalization and self-injurious behaviors. Pt currently denies HI,AVH.  How Long Has This Been Causing You Problems? 1 wk - 1 month  What Do You Feel Would Help You the Most Today? Treatment for Depression or other mood problem; Housing Assistance; Social Support; Medication(s)   Have You Recently Had Any Thoughts About Hurting Yourself? Yes  Are You Planning to Commit Suicide/Harm Yourself At This time? No   Have you Recently Had Thoughts About Hurting Someone Sherral? No  Are You Planning to Harm Someone at This Time? No  Explanation: NA   Have You Used Any Alcohol or Drugs in the Past 24 Hours? Yes  How Long Ago Did You Use Drugs or Alcohol? No data recorded What Did You Use and How Much? cocaine and alcohol   Do You Currently  Have a Therapist/Psychiatrist? No  Name of Therapist/Psychiatrist: No data recorded  Have You Been Recently Discharged From Any Office Practice or Programs? No  Explanation of Discharge From Practice/Program: No data recorded   CCA Screening Triage Referral Assessment Type of Contact: Face-to-Face  Telemedicine Service Delivery:   Is this Initial or Reassessment?   Date Telepsych consult ordered in [facility]:    Time Telepsych consult ordered in [facility]L:    Location of Assessment: [facility] Services  Provider Location: [facility] Assessment Services    Collateral Involvement: NA   Does Patient Have a Automotive engineer Guardian? No data recorded Name and Contact of Legal Guardian: No data recorded If Minor and Not Living with Parent(s), Who has Custody? Pt is an adult.  Is CPS involved or ever been involved? Never  Is APS involved or ever been involved? Never   Patient Determined To Be At Risk for Harm To Self or Others Based on Review of Patient Reported Information or Presenting Complaint? Yes, for Self-Harm  Method: No Plan  Availability of Means: No access or NA  Intent: Vague intent or NA  Notification Required: No need or identified person  Additional Information for Danger to Others Potential: -- (NA)  Additional Comments for Danger to Others Potential: NA  Are There Guns or Other Weapons in Your Home? No  Types of Guns/Weapons: Pt denies.  Are These Weapons Safely Secured?                            -- (NA)  Who Could Verify You Are Able To Have These Secured: NA  Do You Have any Outstanding Charges, Pending Court Dates, Parole/Probation? Pt denies.  Contacted To Inform of Risk of Harm To Self or Others: Other: Comment (NA)   Does Patient Present under Involuntary Commitment? No    [place] of Residence: [place]   Patient Currently Receiving the Following Services: Not Receiving Services   Determination of Need: Urgent (48 hours)   Options  For Referral: Other: Comment; Outpatient Therapy; Medication Management; [facility]   Disposition Recommendation per psychiatric provider: Pt to be admitted to [facility].   Jackson JONETTA Broach, LCMHC   Dianna Deshler D Dayvian Blixt, MS, [facility], [facility] Triage Specialist [phone]

## 2024-02-28 ENCOUNTER — Encounter (HOSPITAL_COMMUNITY): Payer: Self-pay | Admitting: Psychiatry

## 2024-02-28 ENCOUNTER — Other Ambulatory Visit (HOSPITAL_COMMUNITY)
Admission: EM | Admit: 2024-02-28 | Discharge: 2024-03-05 | Disposition: A | Discharge status: Home / Self Care | Source: Intra-hospital | Attending: Psychiatry | Admitting: Psychiatry

## 2024-02-28 DIAGNOSIS — G4700 Insomnia, unspecified: Secondary | ICD-10-CM | POA: Diagnosis not present

## 2024-02-28 DIAGNOSIS — F1090 Alcohol use, unspecified, uncomplicated: Secondary | ICD-10-CM

## 2024-02-28 DIAGNOSIS — R45851 Suicidal ideations: Secondary | ICD-10-CM | POA: Insufficient documentation

## 2024-02-28 DIAGNOSIS — I999 Unspecified disorder of circulatory system: Secondary | ICD-10-CM | POA: Insufficient documentation

## 2024-02-28 DIAGNOSIS — F1510 Other stimulant abuse, uncomplicated: Secondary | ICD-10-CM | POA: Diagnosis present

## 2024-02-28 DIAGNOSIS — F1010 Alcohol abuse, uncomplicated: Secondary | ICD-10-CM | POA: Diagnosis not present

## 2024-02-28 DIAGNOSIS — F14988 Cocaine use, unspecified with other cocaine-induced disorder: Secondary | ICD-10-CM

## 2024-02-28 DIAGNOSIS — F1491 Cocaine use, unspecified, in remission: Secondary | ICD-10-CM

## 2024-02-28 DIAGNOSIS — F32A Depression, unspecified: Secondary | ICD-10-CM | POA: Diagnosis present

## 2024-02-28 DIAGNOSIS — Z79899 Other long term (current) drug therapy: Secondary | ICD-10-CM | POA: Insufficient documentation

## 2024-02-28 LAB — COMPREHENSIVE METABOLIC PANEL WITH GFR
ALT: 67 U/L — ABNORMAL HIGH (ref 0–44)
AST: 33 U/L (ref 15–41)
Albumin: 4.3 g/dL (ref 3.5–5.0)
Alkaline Phosphatase: 54 U/L (ref 38–126)
Anion gap: 8 (ref 5–15)
BUN: <5 mg/dL — ABNORMAL LOW (ref 6–20)
CO2: 29 mmol/L (ref 22–32)
Calcium: 9.9 mg/dL (ref 8.9–10.3)
Chloride: 100 mmol/L (ref 98–111)
Creatinine, Ser: 0.8 mg/dL (ref 0.61–1.24)
GFR, Estimated: >60 mL/min (ref >60)
Glucose, Bld: 105 mg/dL — ABNORMAL HIGH (ref 70–99)
Potassium: 4.3 mmol/L (ref 3.5–5.1)
Sodium: 137 mmol/L (ref 135–145)
Total Bilirubin: 0.8 mg/dL (ref 0.0–1.2)
Total Protein: 7.7 g/dL (ref 6.5–8.1)

## 2024-02-28 LAB — CBC WITH DIFFERENTIAL/PLATELET
Abs Immature Granulocytes: 0.02 K/uL (ref 0.00–0.07)
Basophils Absolute: 0 K/uL (ref 0.0–0.1)
Basophils Relative: 0 %
Eosinophils Absolute: 0 K/uL (ref 0.0–0.5)
Eosinophils Relative: 1 %
HCT: 43.4 % (ref 39.0–52.0)
Hemoglobin: 14.1 g/dL (ref 13.0–17.0)
Immature Granulocytes: 0 %
Lymphocytes Relative: 26 %
Lymphs Abs: 2 K/uL (ref 0.7–4.0)
MCH: 31.9 pg (ref 26.0–34.0)
MCHC: 32.5 g/dL (ref 30.0–36.0)
MCV: 98.2 fL (ref 80.0–100.0)
Monocytes Absolute: 1.1 K/uL — ABNORMAL HIGH (ref 0.1–1.0)
Monocytes Relative: 14 %
Neutro Abs: 4.5 K/uL (ref 1.7–7.7)
Neutrophils Relative %: 59 %
Platelets: 327 K/uL (ref 150–400)
RBC: 4.42 MIL/uL (ref 4.22–5.81)
RDW: 14.6 % (ref 11.5–15.5)
WBC: 7.7 K/uL (ref 4.0–10.5)
nRBC: 0 % (ref 0.0–0.2)

## 2024-02-28 LAB — LIPID PANEL
Cholesterol: 202 mg/dL — ABNORMAL HIGH (ref 0–200)
HDL: 86 mg/dL (ref >40)
LDL Cholesterol: 104 mg/dL — ABNORMAL HIGH (ref 0–99)
Total CHOL/HDL Ratio: 2.3 ratio
Triglycerides: 60 mg/dL (ref <150)
VLDL: 12 mg/dL (ref 0–40)

## 2024-02-28 LAB — POCT URINE DRUG SCREEN - MANUAL ENTRY (I-SCREEN)
POC Amphetamine UR: POSITIVE — AB
POC Amphetamine UR: POSITIVE — AB
POC Buprenorphine (BUP): NOT DETECTED
POC Buprenorphine (BUP): NOT DETECTED
POC Cocaine UR: NOT DETECTED
POC Cocaine UR: NOT DETECTED
POC Marijuana UR: NOT DETECTED
POC Marijuana UR: NOT DETECTED
POC Methadone UR: NOT DETECTED
POC Methadone UR: NOT DETECTED
POC Methamphetamine UR: POSITIVE — AB
POC Methamphetamine UR: POSITIVE — AB
POC Morphine: NOT DETECTED
POC Morphine: NOT DETECTED
POC Oxazepam (BZO): POSITIVE — AB
POC Oxazepam (BZO): POSITIVE — AB
POC Oxycodone UR: NOT DETECTED
POC Oxycodone UR: NOT DETECTED
POC Secobarbital (BAR): NOT DETECTED
POC Secobarbital (BAR): NOT DETECTED

## 2024-02-28 LAB — ETHANOL: Alcohol, Ethyl (B): <15 mg/dL (ref <15)

## 2024-02-28 MED ORDER — LORAZEPAM 2 MG/ML IJ SOLN: 2.0000 mg | Freq: Three times a day (TID) | INTRAMUSCULAR | Status: DC | PRN

## 2024-02-28 MED ORDER — ESCITALOPRAM OXALATE 10 MG PO TABS
10.0000 mg | ORAL_TABLET | Freq: Every day | ORAL | Status: DC
  Administered 2024-02-28 – 2024-03-04 (×6): 10 mg via ORAL
  Filled 2024-02-28 (×4): qty 1
  Filled 2024-02-28: qty 14
  Filled 2024-02-28 (×2): qty 1

## 2024-02-28 MED ORDER — MAGNESIUM HYDROXIDE 400 MG/5ML PO SUSP
30.0000 mL | Freq: Every day | ORAL | Status: DC | PRN
Start: 2024-02-28 — End: 2024-03-05

## 2024-02-28 MED ORDER — THIAMINE MONONITRATE 100 MG PO TABS
100.0000 mg | ORAL_TABLET | Freq: Every day | ORAL | Status: DC
  Administered 2024-02-29 – 2024-03-05 (×6): 100 mg via ORAL
  Filled 2024-02-28: qty 1
  Filled 2024-02-28: qty 14
  Filled 2024-02-28 (×5): qty 1

## 2024-02-28 MED ORDER — ACETAMINOPHEN 325 MG PO TABS
650.0000 mg | ORAL_TABLET | Freq: Four times a day (QID) | ORAL | Status: DC | PRN
  Administered 2024-02-28 – 2024-03-04 (×4): 650 mg via ORAL
  Filled 2024-02-28 (×4): qty 2

## 2024-02-28 MED ORDER — HALOPERIDOL LACTATE 5 MG/ML IJ SOLN: 10.0000 mg | Freq: Three times a day (TID) | INTRAMUSCULAR | Status: DC | PRN

## 2024-02-28 MED ORDER — ALUM & MAG HYDROXIDE-SIMETH 200-200-20 MG/5ML PO SUSP
30.0000 mL | ORAL | Status: DC | PRN
  Administered 2024-03-04: 30 mL via ORAL
  Filled 2024-02-28: qty 30

## 2024-02-28 MED ORDER — DIPHENHYDRAMINE HCL 50 MG/ML IJ SOLN: 50.0000 mg | Freq: Three times a day (TID) | INTRAMUSCULAR | Status: DC | PRN

## 2024-02-28 MED ORDER — HALOPERIDOL 5 MG PO TABS: 5.0000 mg | ORAL_TABLET | Freq: Three times a day (TID) | ORAL | Status: DC | PRN

## 2024-02-28 MED ORDER — HYDROXYZINE HCL 25 MG PO TABS: 25.0000 mg | ORAL_TABLET | Freq: Three times a day (TID) | ORAL | Status: DC | PRN

## 2024-02-28 MED ORDER — LORAZEPAM 1 MG PO TABS
1.0000 mg | ORAL_TABLET | Freq: Once | ORAL | Status: AC
  Administered 2024-03-01: 1 mg via ORAL
  Filled 2024-02-28: qty 1

## 2024-02-28 MED ORDER — LORAZEPAM 1 MG PO TABS
1.0000 mg | ORAL_TABLET | Freq: Three times a day (TID) | ORAL | Status: AC
  Administered 2024-02-28 – 2024-02-29 (×3): 1 mg via ORAL
  Filled 2024-02-28 (×3): qty 1

## 2024-02-28 MED ORDER — LORAZEPAM 1 MG PO TABS
1.0000 mg | ORAL_TABLET | Freq: Two times a day (BID) | ORAL | Status: AC
  Administered 2024-02-29 – 2024-03-01 (×2): 1 mg via ORAL
  Filled 2024-02-28 (×2): qty 1

## 2024-02-28 MED ORDER — LORAZEPAM 1 MG PO TABS
1.0000 mg | ORAL_TABLET | Freq: Four times a day (QID) | ORAL | Status: AC
  Administered 2024-02-28 (×2): 1 mg via ORAL
  Filled 2024-02-28 (×2): qty 1

## 2024-02-28 MED ORDER — TRAZODONE HCL 50 MG PO TABS
50.0000 mg | ORAL_TABLET | Freq: Every evening | ORAL | Status: DC | PRN
Start: 2024-02-28 — End: 2024-03-05
  Administered 2024-02-28 – 2024-03-04 (×6): 50 mg via ORAL
  Filled 2024-02-28 (×3): qty 1
  Filled 2024-02-28: qty 14
  Filled 2024-02-28 (×3): qty 1

## 2024-02-28 MED ORDER — DIPHENHYDRAMINE HCL 50 MG PO CAPS
50.0000 mg | ORAL_CAPSULE | Freq: Three times a day (TID) | ORAL | Status: DC | PRN
Start: 2024-02-28 — End: 2024-03-05

## 2024-02-28 MED ORDER — HALOPERIDOL LACTATE 5 MG/ML IJ SOLN: 5.0000 mg | Freq: Three times a day (TID) | INTRAMUSCULAR | Status: DC | PRN

## 2024-02-28 NOTE — Group Note (Signed)
 Group Topic: Emotional Regulation  Group Date: 02/28/2024 Start Time: 1710 End Time: 1740 Facilitators: Daved Tinnie HERO, RN  Department: [facility]  Number of Participants: 7  Group Focus: loss/grief issues Treatment Modality:  Psychoeducation Interventions utilized were exploration Purpose: increase insight  Name: Eddie Smith Date of Birth: 1980-08-15  MR: [id number]    Level of Participation: active Quality of Participation: attentive and cooperative Interactions with others: gave feedback Mood/Affect: appropriate Triggers (if applicable): n/a Cognition: coherent/clear Progress: Significant Response: pt says he will help navigate grieving by changing his environment Plan: patient will be encouraged to attend future RN education groups  Patients Problems:  Patient Active Problem List   Diagnosis Date Noted   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

## 2024-02-28 NOTE — ED Notes (Signed)
 Patient is sleeping. Respirations equal and unlabored, skin warm and dry. No change in assessment or acuity. Routine safety checks conducted according to facility protocol.

## 2024-02-28 NOTE — Progress Notes (Signed)
 Pt is awake, alert and oriented X3. Pt was guarded and cautious during assessment. Pt endorses passive SI, no plan or intent. Pt verbally contracts for safety on the unit. Pt was advised to notify staff when having intrusive thoughts of hurting self or others. Pt verbalized understanding. Pt complained of headache. No signs of acute distress noted. PRN Acetaminophen  and scheduled meds administered per order. Pt denies current HI/AVH. Staff will monitor for pt's safety.

## 2024-02-28 NOTE — ED Notes (Signed)
 Pt ate lunch. Pt denies complaints at this time. Pt currently resting in room. RN discussed medications wit pt- questions denied.

## 2024-02-28 NOTE — ED Notes (Signed)
 Pt ate dinner and attended evening RN education group. Pt compliant with all scheduled medications.

## 2024-02-28 NOTE — ED Notes (Signed)
 Patient currently sleeping and resting in recliner. Pt observed/assessed in adult observation unit. RR even and unlabored, appearing in no noted distress. Environmental check complete
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
## 2024-02-28 NOTE — ED Notes (Signed)
 Pt oriented to unit, questions denied. Pt provided with lunch as requested, pt denies food allergies. Pt denies current hi si avh- verbal contract for safety provided. Pt denies pain and discomforts at this time. Pt reports feeling hopeless. Pt is generally cooperative.

## 2024-02-28 NOTE — Progress Notes (Signed)
 Report called to nurse on [facility]. Pt transferred to [facility].

## 2024-02-29 DIAGNOSIS — G4700 Insomnia, unspecified: Secondary | ICD-10-CM | POA: Diagnosis not present

## 2024-02-29 DIAGNOSIS — R45851 Suicidal ideations: Secondary | ICD-10-CM | POA: Diagnosis not present

## 2024-02-29 DIAGNOSIS — I999 Unspecified disorder of circulatory system: Secondary | ICD-10-CM | POA: Diagnosis not present

## 2024-02-29 DIAGNOSIS — F1010 Alcohol abuse, uncomplicated: Secondary | ICD-10-CM | POA: Diagnosis not present

## 2024-02-29 NOTE — ED Provider Notes (Signed)
 Facility Based Crisis Admission H&P  Date: 02/29/24 Patient Name: Eddie Smith MRN: [id number] Chief Complaint: SI & ETOH abuse  Diagnoses:  Final diagnoses:  Alcohol use disorder  History of cocaine use  Cocaine-induced vascular disorder (HCC)   HPI: Eddie Smith 43 y/o male patient presented to [facility] as a walk in voluntarily accompanied by GPD with complaints of worsening depression, anxiety symptoms and suicidal ideations without plan or intent since separating from his wife about 3 weeks ago. Patient also reported alcohol use, and requested assisted with ceasing his use and exploring option of going to rehabilitation, and was [facility] to the [facility] the [facility].  Assessment and review of psychiatry symptoms: During encounter with Cru Stark Byers, he confirms that he is a 43 y.o. male with a reported history of MDD, alcohol use d/o & GAD who presented to the [facility] on 02/28/24 with complaints of worsening depressive symptoms as well as alcohol abuse x 3 weeks, and is requesting detox, in an effort to regain & maintain his sobriety.  SUD: Pt reports that his alcohol use has already been for the past 3 weeks, reports that he drinks a 24 pack of beer daily, with one bottle in the 24 pack consisting of a 16 ounce. He denies any other substance abuse with the exception of nicotine  which he states he only smokes when he is drinking alcohol, and denies that he needs a patch or the nicorette gum.  Patient reports that he had been drinking alcohol sparingly, and only occasionally up until 3 weeks ago, when he began drinking heavily.  Denies any complications from alcohol use, denies seizure activity history, denies any history of blackouts related to alcohol use, denies any history of DTs.  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 Patient's UDS at the time was positive for cocaine and amphetamines.  The following recommendations were made prior to discharge:  Continue dual antiplatelet therapy for 2 weeks: Plavix  75 mg daily, aspirin  81 mg daily for acute right cerebellar infarct/cocaine vasculopathy.  Continue ambulation on walker as needed.  Continue plans to go to [facility] on September 1 for rehabilitation.  Patient will follow up with internal medicine clinic prior to going to [facility].  He is scheduled to see Dr. Kenn on August 30 at 10:15 AM. - New meds: Plavix  75 mg, Aspirin  81 mg (dual antiplatelet therapy for 2 weeks) Patient reports that he never followed up with internal medicine, and has been educated to do so once he discharges this time, verbalizes understanding.  Current Stressors: Patient reports that current stressors are related to the break-up of his relationship, between himself and his wife, reports that he recently moved out of the home that they shared, and is currently homeless.  He reports not going back to her, and not being willing to work on that relationship, describes it as being toxic, states that they do not have any children together, states that the wife also drinks alcohol.  Patient reports other stressors as being financial in nature, reports that he currently does not have any job, lost his job due to being unable to go related to transportation issues related to vehicle not having insurance. Other stressors: He has no family support in this area, and his family is in [place] .  He reports that  over the past 3 weeks, he has been drinking alcohol in an effort to self-medicate due to the emotional pain that he has been in.  Rehab: Patient seems not to be very forthcoming regarding his alcohol use history.  He talks about only using alcohol recently heavily, yet states that he has been to the [facility] in the past, but only stayed there x 10  days.  Patient reports that he is interested in going to rehab at this time, in order to regain and maintain his sobriety. Education given on the fact that he will need to work with CSW to go to rehab.   GAD: Patient describes symptoms consistent with anxiety including excessive worrying, restlessness, poor concentration, muscle tension, as the past 6 months, and worsening over the past few weeks. Reports a prior diagnosis of GAD.  Denies symptoms significant for panic attacks.  MDD: Patient also reports symptoms consistent with MDD; insomnia, consisting of trouble falling asleep as well as trouble staying asleep.  He reports anhedonia, reports that he typically enjoys coping, no longer enjoys cooking, typically enjoys watching movies, no longer finds this pleasurable.  Reports feelings of guilt regarding not having a job, also feelings of guilt regarding regarding the motor vehicle issues he was having, reports poor energy levels, trouble with concentration, poor appetite, mental clouding, describes symptoms consistent with psychomotor retardation; trouble getting his mind to coordinate with his physical body to get things going on a daily basis.  He describes poor motivation levels, feelings of hopelessness, helplessness, worthlessness.  He reports worsening suicidal ideations, prior to his presentation to this location.  Denies any intent or plan to harm himself.  Reports that someone who had stayed to this [facility], and was able to regain and maintain the sobriety informed him to come here for assistance, and that is why he came here for help with regaining sobriety.  Past Hospitalizations/meds/MH hx/Medical Hx: Patient denies symptoms significant for other mental health conditions; denies OCD type symptoms, denies symptoms consistent for bipolar 1 type II.  Denies PTSD type symptoms, denies any history of emotional, physical, or sexual abuse as a child or as an adult.  Denies any history of aggressive  behaviors, denies any legal issues, denies fire-setting behaviors.  Denies having any DUIs.  Patient reports a history of a stroke in the past, reports that it happened 2 years ago, states that he was told that he has a blood clot in his neck, and that he needs to be on baby aspirin  daily.  Reports that he has not been compliant with his regimen for at least a year.  Patient is asking for baby aspirin  daily during this stay.  He denies any other medical conditions or problems.  Denies being on any other medications at home.  Patient denies any mental health related hospitalizations in the past, denies any suicide attempts in the past, reports a diagnosis of MDD, and GAD.  Reports he was told in the past that he has borderline bipolar.  Reports that he was on Zoloft but states that he stopped it because it gave him nervousness.  Also reports a history of taking trazodone , states that it was effective in the past.  Social history: Patient reports that he was born in [place], [place] .  He reports that he moved to [place], [place] for work in 2010, married to his wife with whom he recently separated while he was homeless at one point.  Patient reports that his children still reside in [place], shares that he has 6 children ages 2, 56, 83, 87, 86 and 19.  Reports highest level of education is 12th grade,  Current Presentation: Pt presents with an anxious and depressed mood, attention to personal hygiene and grooming is fair, eye contact is good, speech is clear & coherent. Thought contents are organized and logical, and pt currently continues to endorse SI, denies having a plan or intent to harm himself. Agreeable to let staff know  if the thoughts worsen. Denies HI/AVH. He reports some lingering paranoia which started with his worsening alcohol consumption, states that he feels as if people are out to harm him. He denies first rank symptoms are there are no overt signs of  psychosis.  Suicide Risk Assessment:  Moderate:  Frequent suicidal ideation with limited intensity, and duration, some specificity in terms of plans, no associated intent, good self-control, moderate dysphoria/symptomatology, multiple risk factors present, with limited identifiable protective factors, and lacks support system, but able to identify available and accessible social support in the community such as 988/911. Motivated to get better.   Recommendation: Admit to the [facility] for detox from Alcohol. Patient will also benefit from medication adjustments for treatments and stabilization of his mental status and anxiety.   PHQ 2-9:   Flowsheet Row ED from 02/28/2024 in [facility] ED from 02/27/2024 in [facility] ED from [facility]/20[facility]  C-SSRS RISK CATEGORY No Risk Error: Q3, 4, or 5 should not be populated when Q2 is No No Risk      Total Time spent with patient: 1.5 hours  Musculoskeletal  Strength & Muscle Tone: within normal limits Gait & Station: normal Patient leans: N/A  Psychiatric Specialty Exam  Presentation General Appearance:  Appropriate for Environment; Fairly Groomed  Eye Contact: Fair  Speech: Clear and Coherent  Speech Volume: Normal  Handedness: Right   Mood and Affect  Mood: Depressed; Anxious  Affect: Congruent   Thought Process  Thought Processes: Coherent  Descriptions of Associations:Intact  Orientation:Full (Time, Place and Person)  Thought Content:Logical  Diagnosis of Schizophrenia or Schizoaffective disorder in past: No   Hallucinations:Hallucinations: None  Ideas of Reference:Paranoia  Suicidal Thoughts:Suicidal Thoughts: Yes, Active SI Active Intent and/or Plan: Without Intent; Without Plan  Homicidal Thoughts:Homicidal Thoughts: No   Sensorium  Memory: Immediate  Fair  Judgment: Fair  Insight: Fair   Chartered certified accountant: Fair  Attention Span: Fair  Recall: [employer] of Knowledge: Fair  Language: Fair   Psychomotor Activity  Psychomotor Activity:Psychomotor Activity: Normal   Assets  Assets: Financial Resources/Insurance; Resilience   Sleep  Sleep:Sleep: Poor   No data recorded  Physical Exam Vitals and nursing note reviewed.  Eyes:     Pupils: Pupils are equal, round, and reactive to light.  Neurological:     General: No focal deficit present.     Mental Status: He is oriented to person, place, and time.  Psychiatric:        Mood and Affect: Mood normal.    Review of Systems  Psychiatric/Behavioral:  Positive for depression, substance abuse and suicidal ideas. Negative for hallucinations and memory loss. The patient is nervous/anxious and has insomnia.   All other systems reviewed and are negative.   Blood pressure (!) 125/92, pulse 72, temperature 97.6 F (36.4 C), resp. rate 18, SpO2 90%. There is no height or weight on file to calculate BMI.  Is  the patient at risk to self? Yes  Has the patient been a risk to self in the past 6 months? No .    Has the patient been a risk to self within the distant past? No   Is the patient a risk to others? No   Has the patient been a risk to others in the past 6 months? No   Has the patient been a risk to others within the distant past? No   Last Labs:  Admission on 02/27/2024, Discharged on 02/28/2024  Component Date Value Ref Range Status   WBC 02/28/2024 7.7  4.0 - 10.5 K/uL Final   RBC 02/28/2024 4.42  4.22 - 5.81 MIL/uL Final   Hemoglobin 02/28/2024 14.1  13.0 - 17.0 g/dL Final   HCT 92/80/7974 43.4  39.0 - 52.0 % Final   MCV 02/28/2024 98.2  80.0 - 100.0 fL Final   MCH 02/28/2024 31.9  26.0 - 34.0 pg Final   MCHC 02/28/2024 32.5  30.0 - 36.0 g/dL Final   RDW 92/80/7974 14.6  11.5 - 15.5 % Final   Platelets 02/28/2024 327  150 - 400 K/uL  Final   nRBC 02/28/2024 0.0  0.0 - 0.2 % Final   Neutrophils Relative % 02/28/2024 59  % Final   Neutro Abs 02/28/2024 4.5  1.7 - 7.7 K/uL Final   Lymphocytes Relative 02/28/2024 26  % Final   Lymphs Abs 02/28/2024 2.0  0.7 - 4.0 K/uL Final   Monocytes Relative 02/28/2024 14  % Final   Monocytes Absolute 02/28/2024 1.1 (H)  0.1 - 1.0 K/uL Final   Eosinophils Relative 02/28/2024 1  % Final   Eosinophils Absolute 02/28/2024 0.0  0.0 - 0.5 K/uL Final   Basophils Relative 02/28/2024 0  % Final   Basophils Absolute 02/28/2024 0.0  0.0 - 0.1 K/uL Final   Immature Granulocytes 02/28/2024 0  % Final   Abs Immature Granulocytes 02/28/2024 0.02  0.00 - 0.07 K/uL Final   Performed at [facility], [address], [place], [place] [address]   Sodium 02/28/2024 137  135 - 145 mmol/L Final   Potassium 02/28/2024 4.3  3.5 - 5.1 mmol/L Final   Chloride 02/28/2024 100  98 - 111 mmol/L Final   CO2 02/28/2024 29  22 - 32 mmol/L Final   Glucose, Bld 02/28/2024 105 (H)  70 - 99 mg/dL Final   Glucose reference range applies only to samples taken after fasting for at least 8 hours.   BUN 02/28/2024 <5 (L)  6 - 20 mg/dL Final   Creatinine, Ser 02/28/2024 0.80  0.61 - 1.24 mg/dL Final   Calcium 92/80/7974 9.9  8.9 - 10.3 mg/dL Final   Total Protein 92/80/7974 7.7  6.5 - 8.1 g/dL Final   Albumin 92/80/7974 4.3  3.5 - 5.0 g/dL Final   AST 92/80/7974 33  15 - 41 U/L Final   ALT 02/28/2024 67 (H)  0 - 44 U/L Final   Alkaline Phosphatase 02/28/2024 54  38 - 126 U/L Final   Total Bilirubin 02/28/2024 0.8  0.0 - 1.2 mg/dL Final   GFR, Estimated 02/28/2024 >60  >60 mL/min Final   Comment: (NOTE) Calculated using the CKD-EPI Creatinine Equation (2021)    Anion gap 02/28/2024 8  5 - 15 Final   Performed at [facility], [address] [place], [place] [address]   Alcohol, Ethyl (B) 02/28/2024 <15  <15 mg/dL Final   Comment: (NOTE) For medical purposes only. Performed  at [facility], [address] [place], [place] [address]    Cholesterol 02/28/2024 202 (H)  0 - 200 mg/dL Final   Triglycerides 92/80/7974 60  <150 mg/dL Final   HDL 92/80/7974 86  >40 mg/dL Final   Total CHOL/HDL Ratio 02/28/2024 2.3  RATIO Final   VLDL 02/28/2024 12  0 - 40 mg/dL Final   LDL Cholesterol 02/28/2024 104 (H)  0 - 99 mg/dL Final   Comment:        Total Cholesterol/HDL:CHD Risk Coronary Heart Disease Risk Table                     Men   Women  1/2 Average Risk   3.4   3.3  Average Risk       5.0   4.4  2 X Average Risk   9.6   7.1  3 X Average Risk  23.4   11.0        Use the calculated Patient Ratio above and the CHD Risk Table to determine the patient's CHD Risk.        ATP III CLASSIFICATION (LDL):  <100     mg/dL   Optimal  899-870  mg/dL   Near or Above                    Optimal  130-159  mg/dL   Borderline  839-810  mg/dL   High  >809     mg/dL   Very High Performed at [facility], [address], [place], [place] [address]    POC Amphetamine UR 02/28/2024 Positive (A)  NONE DETECTED (Cut Off Level 1000 ng/mL) Final   POC Secobarbital (BAR) 02/28/2024 None Detected  NONE DETECTED (Cut Off Level 300 ng/mL) Final   POC Buprenorphine (BUP) 02/28/2024 None Detected  NONE DETECTED (Cut Off Level 10 ng/mL) Final   POC Oxazepam (BZO) 02/28/2024 Positive (A)  NONE DETECTED (Cut Off Level 300 ng/mL) Final   POC Cocaine UR 02/28/2024 None Detected  NONE DETECTED (Cut Off Level 300 ng/mL) Final   POC Methamphetamine UR 02/28/2024 Positive (A)  NONE DETECTED (Cut Off Level 1000 ng/mL) Final   POC Morphine  02/28/2024 None Detected  NONE DETECTED (Cut Off Level 300 ng/mL) Final   POC Methadone UR 02/28/2024 None Detected  NONE DETECTED (Cut Off Level 300 ng/mL) Final   POC Oxycodone  UR 02/28/2024 None Detected  NONE DETECTED (Cut Off Level 100 ng/mL) Final   POC Marijuana UR 02/28/2024 None Detected  NONE DETECTED (Cut Off Level 50 ng/mL) Final   POC Amphetamine UR 02/28/2024 Positive  (A)  NONE DETECTED (Cut Off Level 1000 ng/mL) Final   POC Secobarbital (BAR) 02/28/2024 None Detected  NONE DETECTED (Cut Off Level 300 ng/mL) Final   POC Buprenorphine (BUP) 02/28/2024 None Detected  NONE DETECTED (Cut Off Level 10 ng/mL) Final   POC Oxazepam (BZO) 02/28/2024 Positive (A)  NONE DETECTED (Cut Off Level 300 ng/mL) Final   POC Cocaine UR 02/28/2024 None Detected  NONE DETECTED (Cut Off Level 300 ng/mL) Final   POC Methamphetamine UR 02/28/2024 Positive (A)  NONE DETECTED (Cut Off Level 1000 ng/mL) Final   POC Morphine  02/28/2024 None Detected  NONE DETECTED (Cut Off Level 300 ng/mL) Final   POC Methadone UR 02/28/2024 None Detected  NONE DETECTED (Cut Off Level 300 ng/mL) Final   POC Oxycodone  UR 02/28/2024 None Detected  NONE DETECTED (Cut Off Level 100 ng/mL) Final  POC Marijuana UR 02/28/2024 None Detected  NONE DETECTED (Cut Off Level 50 ng/mL) Final    Allergies: Patient has no known allergies.  Medications:  Facility Ordered Medications  Medication   [COMPLETED] thiamine  (VITAMIN B1) injection 100 mg   escitalopram  (LEXAPRO ) tablet 10 mg   acetaminophen  (TYLENOL ) tablet 650 mg   alum & mag hydroxide-simeth (MAALOX/MYLANTA) 200-200-20 MG/5ML suspension 30 mL   magnesium  hydroxide (MILK OF MAGNESIA) suspension 30 mL   haloperidol  (HALDOL ) tablet 5 mg   And   diphenhydrAMINE  (BENADRYL ) capsule 50 mg   haloperidol  lactate (HALDOL ) injection 5 mg   And   diphenhydrAMINE  (BENADRYL ) injection 50 mg   And   LORazepam  (ATIVAN ) injection 2 mg   haloperidol  lactate (HALDOL ) injection 10 mg   And   diphenhydrAMINE  (BENADRYL ) injection 50 mg   And   LORazepam  (ATIVAN ) injection 2 mg   hydrOXYzine  (ATARAX ) tablet 25 mg   traZODone  (DESYREL ) tablet 50 mg   [COMPLETED] LORazepam  (ATIVAN ) tablet 1 mg   Followed by   [COMPLETED] LORazepam  (ATIVAN ) tablet 1 mg   Followed by   LORazepam  (ATIVAN ) tablet 1 mg   Followed by   NOREEN ON 03/01/2024] LORazepam  (ATIVAN ) tablet 1  mg   thiamine  (VITAMIN B1) tablet 100 mg   Long Term Goals: Improvement in symptoms so as ready for discharge  Short Term Goals: Patient will verbalize feelings in meetings with treatment team members., Patient will attend at least of 50% of the groups daily., Pt will complete the PHQ9 on admission, day 3 and discharge., Patient will participate in completing the [place] Suicide Severity Rating Scale, and Patient will score a low risk of violence for 24 hours prior to discharge  Medical Decision Making  - Admitted to the observation area of the [facility] pending bed availability at the [facility] -Start Ativan  taper and CIWA scores as per protocol-please see the [facility] for details -Start Hydroxyzine  25 mg TID PRN for anxiety  -Start Monitor for signs of withdrawal -Oral thiamine  and MVI replacement -Continue Trazodone  50 mg nightly prn for sleep -Continue Lexapro  10 mg daily for GAD/MDD  Recommendations  - Abstinence from substances encouraged  - SW to look into options for outpatient SA treatment at discharge  -Q15 minute checks per unit protocol   Donia Snell, NP 02/29/24  4:40 PM

## 2024-02-29 NOTE — Group Note (Signed)
 Group Topic: Understanding Self  Group Date: 02/29/2024 Start Time: 8069 End Time: 2000 Facilitators: Anice Benton LABOR, NT  Department: [facility]  Number of Participants: 4  Group Focus: self-awareness Treatment Modality:  Cognitive Behavioral Therapy Interventions utilized were mental fitness Purpose: explore maladaptive thinking  Name: Eddie Smith Date of Birth: 1981/04/21  MR: [id number]    Level of Participation: minimal Quality of Participation: distractible Interactions with others: gave feedback Mood/Affect: appropriate Triggers (if applicable): N/A Cognition: coherent/clear Progress: Minimal Response: needs to concentrate more   Plan: follow-up needed  Patients Problems:  Patient Active Problem List   Diagnosis Date Noted   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

## 2024-02-29 NOTE — Group Note (Signed)
 Group Topic: Decisional Balance/Substance Abuse  Group Date: 02/29/2024 Start Time: 0800 End Time: 0900 Facilitators: Lonzell Dwayne RAMAN, NT  Department: [facility]  Number of Participants: 8  Group Focus: chemical dependency issues Treatment Modality:  Patient-Centered Therapy Interventions utilized were patient education Purpose: reinforce self-care  Name: Eddie Smith Date of Birth: 1980-08-18  MR: [id number]    Level of Participation: Patient attended groupmoderate Quality of Participation: attentive Interactions with others: gave feedback Mood/Affect: positive Triggers (if applicable): N/A Cognition: coherent/clear Progress: Moderate Response: Appropriate  Plan: patient will be encouraged to continue with treatment plan.  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

## 2024-02-29 NOTE — ED Notes (Signed)
 Patient said, so far I am doing well. Reported having depression and anxiety. He has sleep disturbances. Eating, he said, I started eating today. Suicidal ideation he reported that he gets the command but no intention to commit suicide. He denied auditory and visual hallucinations. Denied having tremor but reported sweating during the night. He  is calm and quiet. Med compliant.

## 2024-02-29 NOTE — ED Notes (Signed)
 Pt requested writer put in note to notify MD that he would want antidepressant stronger than lexapro . Writer advised pt to talk to the MD when he comes for rounding as well even though writer will put in the note.

## 2024-02-29 NOTE — ED Notes (Addendum)
 Patient is sleeping. Respirations equal and unlabored, skin warm and dry, NAD. No change in assessment or acuity. Routine safety checks conducted according to facility protocol.

## 2024-02-29 NOTE — ED Notes (Signed)
 Awake and alert in dayroom watching TV with peers.

## 2024-02-29 NOTE — Group Note (Signed)
 Group Topic: Recovery Basics  Group Date: 02/29/2024 Start Time: 1205 End Time: 1240 Facilitators: Stanly Stabile, RN  Department: [facility]  Number of Participants: 7  Group Focus: chemical dependency education, chemical dependency issues, clarity of thought, communication, and coping skills Treatment Modality:  Behavior Modification Therapy Interventions utilized were clarification, exploration, patient education, and problem solving Purpose: enhance coping skills, explore maladaptive thinking, express feelings, express irrational fears, improve communication skills, increase insight, regain self-worth, reinforce self-care, and relapse prevention strategies  Name: Eddie Smith Date of Birth: Jul 18, 1981  MR: [id number]    Level of Participation: minimal Quality of Participation: attentive Interactions with others: gave feedback Mood/Affect: appropriate Triggers (if applicable):   Cognition: coherent/clear Progress: Minimal Response:   Plan: follow-up needed  Patients Problems:  Patient Active Problem List   Diagnosis Date Noted   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

## 2024-02-29 NOTE — ED Notes (Signed)
 Patient resting in bed at this time.  He denies suicidal ideation or plan.  No distress and no withdrawal.  Will monitor.

## 2024-03-01 DIAGNOSIS — I999 Unspecified disorder of circulatory system: Secondary | ICD-10-CM | POA: Diagnosis not present

## 2024-03-01 DIAGNOSIS — F1010 Alcohol abuse, uncomplicated: Secondary | ICD-10-CM | POA: Diagnosis not present

## 2024-03-01 DIAGNOSIS — R45851 Suicidal ideations: Secondary | ICD-10-CM | POA: Diagnosis not present

## 2024-03-01 DIAGNOSIS — G4700 Insomnia, unspecified: Secondary | ICD-10-CM | POA: Diagnosis not present

## 2024-03-01 MED ORDER — ARIPIPRAZOLE 5 MG PO TABS
5.0000 mg | ORAL_TABLET | Freq: Every day | ORAL | Status: DC
  Administered 2024-03-01 – 2024-03-05 (×5): 5 mg via ORAL
  Filled 2024-03-01: qty 14
  Filled 2024-03-01 (×5): qty 1

## 2024-03-01 NOTE — ED Notes (Signed)
 Patient is in the bedroom calm and and sleeping. NAD.  Will monitor for safety

## 2024-03-01 NOTE — ED Notes (Signed)
 Patient is calm, quiet and sleeping, nothing strange observed

## 2024-03-01 NOTE — ED Provider Notes (Addendum)
 Behavioral Health Progress Note   Date: 03/01/24 Patient Name: Eddie Smith MRN: [id number] Chief Complaint: SI & ETOH abuse  Diagnoses:  Final diagnoses:  Alcohol use disorder  History of cocaine use  Cocaine-induced vascular disorder (HCC)   HPI: Eddie Smith 43 y/o male patient presented to [facility] as a walk in voluntarily accompanied by [employer] with complaints of worsening depression, anxiety symptoms and suicidal ideations without plan or intent since separating from his wife about 3 weeks ago. Patient also reported alcohol use, and requested assisted with ceasing his use and exploring option of going to rehabilitation, and was [facility].  Patient assessment, 03/01/2024: Pt presents with mood slightly improved as compared to yesterday, reports that sleep was good on the medication regimen that was given to him last night. Reports that energy is also improving. Yesterday, patient reported that he had some lingering paranoia which only began 3 weeks ago with his worsening depressive symptoms and worsening in his alcohol abuse. Today, he reports  that those feelings have intensified; he reports that he feels are though people outside of the hospital are out to harm him. We talked about augmenting his Lexapro  with Abilify  5 mg for management of his lingering psychosis, to which he was receptive. Patient was educated on the benefits, rationales, and possible side effects of this medication, and verbalized understanding.  Pt's attention to personal hygiene and grooming is fair, eye contact is good, speech is clear & coherent. Thought contents are organized and logical, and pt denies SI today, states that he feels safe here at the [facility], denies having a plan or intent to harm himself while at this facility. Agreeable to let staff know  if the thoughts of suicide return. Denies HI/AVH.   Inpatient hospitalization continues to be necessary  at this time as pt continues to be on the Ativan  taper to mitigate any possible complications from alcohol withdrawal. He is also working with CSW in an attempt to pursue his goal of going to inpatient rehab after his stay at this facility. Patient is scheduled to complete his last dose of Ativan  tonight at 2200. We will revisit discharge planning with CSW tomorrow morning, but projected discharge date for him is 7/23.  Vitals reviewed, SBP low at 88, patient asymptomatic, encouraged to push fluids, Rns educated to recheck vitals prior to initiating Abilify . He reported a headache earlier today morning, was educated on the need to ask RN for Tylenol .   Labs reviewed: Ordered Vit D, B12, Ha1c, TSH for tonight.  PHQ 2-9:  Flowsheet Row ED from 02/28/2024 in [facility]  Thoughts that you would be better off dead, or of hurting yourself in some way Several days  PHQ-9 Total Score 9    Flowsheet Row ED from 02/28/2024 in [facility] ED from 02/27/2024 in [facility] ED from 10/03/2022 in [facility]  C-SSRS RISK CATEGORY No Risk Error: Q3, 4, or 5 should not be populated when Q2 is No No Risk     Total Time spent with patient: 1.5 hours  Musculoskeletal  Strength & Muscle Tone: within normal limits Gait & Station: normal Patient leans: N/A  Psychiatric Specialty Exam  Presentation General Appearance:  Appropriate for Environment; Fairly Groomed  Eye Contact: Fair  Speech: Clear and Coherent  Speech Volume: Normal  Handedness: Right   Mood and Affect  Mood: Depressed; Anxious  Affect: Congruent   Thought Process  Thought Processes: Coherent  Descriptions of Associations:Intact  Orientation:Full (Time, Place and Person)  Thought Content:Logical  Diagnosis of Schizophrenia or Schizoaffective disorder in past: No    Hallucinations:Hallucinations: None  Ideas of Reference:None  Suicidal Thoughts:Suicidal Thoughts: No SI Active Intent and/or Plan: Without Intent; Without Plan  Homicidal Thoughts:Homicidal Thoughts: No   Sensorium  Memory: Immediate Fair  Judgment: Fair  Insight: Fair   Art therapist  Concentration: Fair  Attention Span: Fair  Recall: [employer] of Knowledge: Fair  Language: Fair   Psychomotor Activity  Psychomotor Activity:Psychomotor Activity: Normal   Assets  Assets: Desire for Improvement   Sleep  Sleep:Sleep: Good   No data recorded  Physical Exam Vitals and nursing note reviewed.  Eyes:     Pupils: Pupils are equal, round, and reactive to light.  Neurological:     General: No focal deficit present.     Mental Status: He is oriented to person, place, and time.  Psychiatric:        Mood and Affect: Mood normal.    Review of Systems  Psychiatric/Behavioral:  Positive for depression and substance abuse. Negative for hallucinations, memory loss and suicidal ideas. The patient is nervous/anxious and has insomnia.   All other systems reviewed and are negative.   Blood pressure (!) 88/62, pulse 76, temperature 98.4 F (36.9 C), temperature source Oral, resp. rate 15, SpO2 97%. There is no height or weight on file to calculate BMI.  Is the patient at risk to self? Yes  Has the patient been a risk to self in the past 6 months? No .    Has the patient been a risk to self within the distant past? No   Is the patient a risk to others? No   Has the patient been a risk to others in the past 6 months? No   Has the patient been a risk to others within the distant past? No   Last Labs:  Admission on 02/27/2024, Discharged on 02/28/2024  Component Date Value Ref Range Status   WBC 02/28/2024 7.7  4.0 - 10.5 K/uL Final   RBC 02/28/2024 4.42  4.22 - 5.81 MIL/uL Final   Hemoglobin 02/28/2024 14.1  13.0 - 17.0 g/dL Final   HCT 92/80/7974  43.4  39.0 - 52.0 % Final   MCV 02/28/2024 98.2  80.0 - 100.0 fL Final   MCH 02/28/2024 31.9  26.0 - 34.0 pg Final   MCHC 02/28/2024 32.5  30.0 - 36.0 g/dL Final   RDW 92/80/7974 14.6  11.5 - 15.5 % Final   Platelets 02/28/2024 327  150 - 400 K/uL Final   nRBC 02/28/2024 0.0  0.0 - 0.2 % Final   Neutrophils Relative % 02/28/2024 59  % Final   Neutro Abs 02/28/2024 4.5  1.7 - 7.7 K/uL Final   Lymphocytes Relative 02/28/2024 26  % Final   Lymphs Abs 02/28/2024 2.0  0.7 - 4.0 K/uL Final   Monocytes Relative 02/28/2024 14  % Final   Monocytes Absolute 02/28/2024 1.1 (H)  0.1 - 1.0 K/uL Final   Eosinophils Relative 02/28/2024 1  % Final   Eosinophils Absolute 02/28/2024 0.0  0.0 - 0.5 K/uL Final   Basophils Relative 02/28/2024 0  % Final   Basophils Absolute 02/28/2024 0.0  0.0 - 0.1 K/uL Final   Immature Granulocytes 02/28/2024 0  % Final   Abs Immature Granulocytes 02/28/2024 0.02  0.00 - 0.07 K/uL  Final   Performed at [facility], [address] [place], [place] [address]   Sodium 02/28/2024 137  135 - 145 mmol/L Final   Potassium 02/28/2024 4.3  3.5 - 5.1 mmol/L Final   Chloride 02/28/2024 100  98 - 111 mmol/L Final   CO2 02/28/2024 29  22 - 32 mmol/L Final   Glucose, Bld 02/28/2024 105 (H)  70 - 99 mg/dL Final   Glucose reference range applies only to samples taken after fasting for at least 8 hours.   BUN 02/28/2024 <5 (L)  6 - 20 mg/dL Final   Creatinine, Ser 02/28/2024 0.80  0.61 - 1.24 mg/dL Final   Calcium 92/80/7974 9.9  8.9 - 10.3 mg/dL Final   Total Protein 92/80/7974 7.7  6.5 - 8.1 g/dL Final   Albumin 92/80/7974 4.3  3.5 - 5.0 g/dL Final   AST 92/80/7974 33  15 - 41 U/L Final   ALT 02/28/2024 67 (H)  0 - 44 U/L Final   Alkaline Phosphatase 02/28/2024 54  38 - 126 U/L Final   Total Bilirubin 02/28/2024 0.8  0.0 - 1.2 mg/dL Final   GFR, Estimated 02/28/2024 >60  >60 mL/min Final   Comment: (NOTE) Calculated using the CKD-EPI Creatinine Equation (2021)    Anion  gap 02/28/2024 8  5 - 15 Final   Performed at [facility], [address] [place], [place] [address]   Alcohol, Ethyl (B) 02/28/2024 <15  <15 mg/dL Final   Comment: (NOTE) For medical purposes only. Performed at [facility], [address], [place], [place] [address]    Cholesterol 02/28/2024 202 (H)  0 - 200 mg/dL Final   Triglycerides 92/80/7974 60  <150 mg/dL Final   HDL 92/80/7974 86  >40 mg/dL Final   Total CHOL/HDL Ratio 02/28/2024 2.3  RATIO Final   VLDL 02/28/2024 12  0 - 40 mg/dL Final   LDL Cholesterol 02/28/2024 104 (H)  0 - 99 mg/dL Final   Comment:        Total Cholesterol/HDL:CHD Risk Coronary Heart Disease Risk Table                     Men   Women  1/2 Average Risk   3.4   3.3  Average Risk       5.0   4.4  2 X Average Risk   9.6   7.1  3 X Average Risk  23.4   11.0        Use the calculated Patient Ratio above and the CHD Risk Table to determine the patient's CHD Risk.        ATP III CLASSIFICATION (LDL):  <100     mg/dL   Optimal  899-870  mg/dL   Near or Above                    Optimal  130-159  mg/dL   Borderline  839-810  mg/dL   High  >809     mg/dL   Very High Performed at [facility], [address] [place], [place] [address]    POC Amphetamine UR 02/28/2024 Positive (A)  NONE DETECTED (Cut Off Level 1000 ng/mL) Final   POC Secobarbital (BAR) 02/28/2024 None Detected  NONE DETECTED (Cut Off Level 300 ng/mL) Final   POC Buprenorphine (BUP) 02/28/2024 None Detected  NONE DETECTED (Cut Off Level 10 ng/mL) Final   POC Oxazepam (BZO) 02/28/2024 Positive (A)  NONE DETECTED (Cut Off Level 300 ng/mL) Final   POC Cocaine UR 02/28/2024 None Detected  NONE DETECTED (Cut Off Level 300 ng/mL) Final   POC Methamphetamine UR 02/28/2024 Positive (A)  NONE DETECTED (Cut Off Level 1000 ng/mL) Final   POC Morphine  02/28/2024 None Detected  NONE DETECTED (Cut Off Level 300 ng/mL) Final   POC Methadone UR 02/28/2024 None Detected  NONE DETECTED  (Cut Off Level 300 ng/mL) Final   POC Oxycodone  UR 02/28/2024 None Detected  NONE DETECTED (Cut Off Level 100 ng/mL) Final   POC Marijuana UR 02/28/2024 None Detected  NONE DETECTED (Cut Off Level 50 ng/mL) Final   POC Amphetamine UR 02/28/2024 Positive (A)  NONE DETECTED (Cut Off Level 1000 ng/mL) Final   POC Secobarbital (BAR) 02/28/2024 None Detected  NONE DETECTED (Cut Off Level 300 ng/mL) Final   POC Buprenorphine (BUP) 02/28/2024 None Detected  NONE DETECTED (Cut Off Level 10 ng/mL) Final   POC Oxazepam (BZO) 02/28/2024 Positive (A)  NONE DETECTED (Cut Off Level 300 ng/mL) Final   POC Cocaine UR 02/28/2024 None Detected  NONE DETECTED (Cut Off Level 300 ng/mL) Final   POC Methamphetamine UR 02/28/2024 Positive (A)  NONE DETECTED (Cut Off Level 1000 ng/mL) Final   POC Morphine  02/28/2024 None Detected  NONE DETECTED (Cut Off Level 300 ng/mL) Final   POC Methadone UR 02/28/2024 None Detected  NONE DETECTED (Cut Off Level 300 ng/mL) Final   POC Oxycodone  UR 02/28/2024 None Detected  NONE DETECTED (Cut Off Level 100 ng/mL) Final   POC Marijuana UR 02/28/2024 None Detected  NONE DETECTED (Cut Off Level 50 ng/mL) Final    Allergies: Patient has no known allergies.  Medications:  Facility Ordered Medications  Medication   [COMPLETED] thiamine  (VITAMIN B1) injection 100 mg   escitalopram  (LEXAPRO ) tablet 10 mg   acetaminophen  (TYLENOL ) tablet 650 mg   alum & mag hydroxide-simeth (MAALOX/MYLANTA) 200-200-20 MG/5ML suspension 30 mL   magnesium  hydroxide (MILK OF MAGNESIA) suspension 30 mL   haloperidol  (HALDOL ) tablet 5 mg   And   diphenhydrAMINE  (BENADRYL ) capsule 50 mg   haloperidol  lactate (HALDOL ) injection 5 mg   And   diphenhydrAMINE  (BENADRYL ) injection 50 mg   And   LORazepam  (ATIVAN ) injection 2 mg   haloperidol  lactate (HALDOL ) injection 10 mg   And   diphenhydrAMINE  (BENADRYL ) injection 50 mg   And   LORazepam  (ATIVAN ) injection 2 mg   hydrOXYzine  (ATARAX ) tablet 25 mg    traZODone  (DESYREL ) tablet 50 mg   [COMPLETED] LORazepam  (ATIVAN ) tablet 1 mg   Followed by   [COMPLETED] LORazepam  (ATIVAN ) tablet 1 mg   Followed by   [COMPLETED] LORazepam  (ATIVAN ) tablet 1 mg   Followed by   LORazepam  (ATIVAN ) tablet 1 mg   thiamine  (VITAMIN B1) tablet 100 mg   Long Term Goals: Improvement in symptoms so as ready for discharge  Short Term Goals: Patient will verbalize feelings in meetings with treatment team members., Patient will attend at least of 50% of the groups daily., Pt will complete the PHQ9 on admission, day 3 and discharge., Patient will participate in completing the [place] Suicide Severity Rating Scale, and Patient will score a low risk of violence for 24 hours prior to discharge  Medical Decision Making  -Continue Admission to the [facility] for treatment and stabilization of mental status. -Start Abilify  5 mg daily for psychosis -Continue Ativan  taper and CIWA scores as per protocol-please see the [facility]  for details -Continue Hydroxyzine  25 mg TID PRN for anxiety  -Continue Monitor for signs of withdrawal -Oral thiamine  and MVI replacement -Continue Trazodone  50 mg nightly prn for sleep -Continue Lexapro  10 mg daily for GAD/MDD  Recommendations  - Abstinence from substances encouraged  - SW to look into options for outpatient SA treatment at discharge  -Q15 minute checks per unit protocol   Donia Snell, NP 03/01/24  12:47 PM

## 2024-03-01 NOTE — ED Notes (Signed)
 Patient is in the bedroom calm and sleeping. NAD. Respirations even and unlabored.  Environment secured per policy. Will keep monitoring for safety.

## 2024-03-01 NOTE — Discharge Planning (Signed)
 LCSW met with patient to assess current mood, affect, physical state, and inquire about needs/goals while here in [facility] and after discharge. Patient reports he presented to [facility] due to alcohol and cocaine use disorder. Patient reported to be drinking 24 beers per day over the past 3 weeks but stated he did not drink much before. Patient stated use of cocaine since August 2023.   Patient reports he has been living homeless for past 3 weeks and staying with some friends and some on the streets during this time. Patient was on a work release program for the past 6 months and was working at [employer] in [place] until May 25 when he was released.   Patient is on parole and was incarcerated prior to work release for 13 months for assault. Patient reports his current goal is to seek residential placement for substance use. Patient denies any prior history of outpatient or inpatient substance abuse treatment. Patient currently denies any SI/HI/AVH.   Patient aware that LCSW will send referrals out for review and will follow up to provide updates as received. Patient expressed understanding and appreciation of LCSW assistance. No other needs were reported at this time by patient.

## 2024-03-01 NOTE — Group Note (Signed)
 Group Topic: Change and Accountability  Group Date: 03/01/2024 Start Time: 2100 End Time: 2200 Facilitators: Joan Plowman B  Department: [facility]  Number of Participants: 1  Group Focus: abuse issues, acceptance, anger management, anxiety, chemical dependency education, chemical dependency issues, co-dependency, daily focus, depression, diagnosis education, discharge education, and individual meeting Treatment Modality:  Individual Therapy Interventions utilized were leisure development Purpose: enhance coping skills, express feelings, express irrational fears, increase insight, and relapse prevention strategies  Name: Eddie Smith Date of Birth: [birth year]  MR: [id number]    Level of Participation: PT DID NOT ATTEND GROUP Quality of Participation: cooperative Interactions with others: gave feedback Mood/Affect: appropriate Triggers (if applicable): NA Cognition: coherent/clear Progress: None Response: NA Plan: patient will be encouraged to go to groups.   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

## 2024-03-01 NOTE — ED Notes (Signed)
 Patient is sleeping

## 2024-03-01 NOTE — ED Notes (Signed)
 Awake and alert on unit.  In day room eating breakfast.  No withdrawal Will monitor.

## 2024-03-02 DIAGNOSIS — F1010 Alcohol abuse, uncomplicated: Secondary | ICD-10-CM | POA: Diagnosis not present

## 2024-03-02 DIAGNOSIS — R45851 Suicidal ideations: Secondary | ICD-10-CM | POA: Diagnosis not present

## 2024-03-02 DIAGNOSIS — I999 Unspecified disorder of circulatory system: Secondary | ICD-10-CM | POA: Diagnosis not present

## 2024-03-02 DIAGNOSIS — G4700 Insomnia, unspecified: Secondary | ICD-10-CM | POA: Diagnosis not present

## 2024-03-02 LAB — VITAMIN B12: Vitamin B-12: 210 pg/mL (ref 180–914)

## 2024-03-02 LAB — LIPID PANEL
Cholesterol: 160 mg/dL (ref 0–200)
HDL: 61 mg/dL (ref >40)
LDL Cholesterol: 87 mg/dL (ref 0–99)
Total CHOL/HDL Ratio: 2.6 ratio
Triglycerides: 61 mg/dL (ref <150)
VLDL: 12 mg/dL (ref 0–40)

## 2024-03-02 LAB — VITAMIN D 25 HYDROXY (VIT D DEFICIENCY, FRACTURES): Vit D, 25-Hydroxy: 17.73 ng/mL — ABNORMAL LOW (ref 30–100)

## 2024-03-02 LAB — TSH: TSH: 0.726 u[IU]/mL (ref 0.350–4.500)

## 2024-03-02 NOTE — Group Note (Signed)
 Group Topic: Relapse and Recovery  Group Date: 03/01/2024 Start Time: 1205 End Time: 1230 Facilitators: Stanly Stabile, RN  Department: [facility]  Number of Participants: 5  Group Focus: check in, chemical dependency education, and chemical dependency issues Treatment Modality:  Behavior Modification Therapy Interventions utilized were clarification, exploration, patient education, and problem solving Purpose: enhance coping skills, explore maladaptive thinking, express feelings, express irrational fears, improve communication skills, increase insight, regain self-worth, reinforce self-care, and relapse prevention strategies  Name: Eddie Smith Date of Birth: 12/25/1980  MR: [id number]    Level of Participation: moderate Quality of Participation: attentive Interactions with others: gave feedback Mood/Affect: appropriate Triggers (if applicable):   Cognition: coherent/clear Progress: Gaining insight Response:   Plan: follow-up needed  Patients Problems:  Patient Active Problem List   Diagnosis Date Noted   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

## 2024-03-02 NOTE — ED Notes (Signed)
 Patient asleep in bed. NAD. Will keep monitoring for safety.

## 2024-03-02 NOTE — Group Note (Signed)
 Group Topic: Relapse and Recovery  Group Date: 03/02/2024 Start Time: 9089 End Time: 1000 Facilitators: Herold Lajuana NOVAK, RN  Department: [facility]  Number of Participants: 6  Group Focus: coping skills Treatment Modality:  Leisure Development Interventions utilized were leisure development Purpose: relapse prevention strategies  Name: Eddie Smith Date of Birth: [birth year]  MR: [id number]       Level of Participation: active Quality of Participation: cooperative Interactions with others: gave feedback Mood/Affect: appropriate Triggers (if applicable): none identified Cognition: coherent/clear Progress: minimum Response: My coping skill is to just eat if I can't get no drugs. I love food Plan: patient will be encouraged to utilize recognized coping skill and seek alternative solutions to managing cravings

## 2024-03-02 NOTE — Discharge Planning (Signed)
 SW spoke with [facility] and they received patients referral and asked for him to call in for his pre screen. SW provided patient with the number and he stated that he would go and call them here in a few minutes. Will continue to follow.

## 2024-03-02 NOTE — ED Notes (Signed)
 Patient A&Ox4. Pt continue to endorse passive SI no plan or intent. Denies A/VH. Patient denies any physical complaints when asked. Routine safety checks conducted according to facility protocol. Encouraged patient to notify staff if thoughts of harm toward self or others arise. Patient verbalize understanding and agreement. Will continue to monitor for safety.

## 2024-03-02 NOTE — ED Notes (Signed)
 Pt sitting in dayroom watching television and eating lunch. No acute distress noted. No concerns voiced. Informed pt to notify staff with any needs or assistance. Pt verbalized understanding and agreement. Will continue to monitor for safety.

## 2024-03-02 NOTE — Group Note (Signed)
 Group Topic: Communication  Group Date: 03/02/2024 Start Time: 2000 End Time: 2100 Facilitators: Joan Plowman B  Department: [facility]  Number of Participants: 5  Group Focus: abuse issues and activities of daily living skills Treatment Modality:  Individual Therapy Interventions utilized were leisure development Purpose: enhance coping skills, express feelings, relapse prevention strategies, and trigger / craving management  Name: Eddie Smith Date of Birth: Jun 03, 1981  MR: [id number]    Level of Participation: PT DID NOT ATTEND GROUP Quality of Participation: cooperative Interactions with others: gave feedback Mood/Affect: appropriate Triggers (if applicable): NA Cognition: coherent/clear Progress: None Response: NA Plan: patient will be encouraged to go to groups.   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

## 2024-03-02 NOTE — Group Note (Signed)
 Group Topic: Change and Accountability  Group Date: 03/02/2024 Start Time: 0800 End Time: 0900 Facilitators: Lonzell Dwayne RAMAN, NT  Department: [facility]  Number of Participants: 7  Group Focus: acceptance Treatment Modality:  Behavior Modification Therapy Interventions utilized were problem solving Purpose: increase insight  Name: Eddie Smith Date of Birth: 14-Apr-1981  MR: [id number]    Level of Participation: Patient attended groupminimal Quality of Participation: attentive Interactions with others: gave feedback Mood/Affect: appropriate Triggers (if applicable): N/A Cognition: not focused Progress: Minimal Response: Appropriate  Plan: patient will be encouraged to be more positive and believe he can get better, instead of just following along, you have to believe in order to get there.  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

## 2024-03-03 DIAGNOSIS — R45851 Suicidal ideations: Secondary | ICD-10-CM | POA: Diagnosis not present

## 2024-03-03 DIAGNOSIS — G4700 Insomnia, unspecified: Secondary | ICD-10-CM | POA: Diagnosis not present

## 2024-03-03 DIAGNOSIS — F1010 Alcohol abuse, uncomplicated: Secondary | ICD-10-CM | POA: Diagnosis not present

## 2024-03-03 DIAGNOSIS — I999 Unspecified disorder of circulatory system: Secondary | ICD-10-CM | POA: Diagnosis not present

## 2024-03-03 MED ORDER — VITAMIN D (ERGOCALCIFEROL) 1.25 MG (50000 UNIT) PO CAPS
50000.0000 [IU] | ORAL_CAPSULE | ORAL | Status: DC
  Administered 2024-03-03: 50000 [IU] via ORAL
  Filled 2024-03-03: qty 1
  Filled 2024-03-03: qty 2

## 2024-03-03 NOTE — ED Notes (Signed)
 Pt is observed watching television in the dayroom and attended group. Pt denies SI/HI/AVH. Pt c/o headache rated 8/10. PRN Tylenol  given as per NP orders. He reported a better mood since being here. He is med compliant and is currently safe on the unit with Q 15 min safety checks in place.

## 2024-03-03 NOTE — ED Provider Notes (Signed)
 Behavioral Health Progress Note   Date: 03/03/24 Patient Name: Eddie Smith MRN: [id number] Chief Complaint: SI & ETOH abuse  Diagnoses:  Final diagnoses:  Alcohol use disorder  History of cocaine use  Cocaine-induced vascular disorder (HCC)   HPI: Eddie Smith 43 y/o male patient presented to [facility] as a walk in voluntarily accompanied by [employer] with complaints of worsening depression, anxiety symptoms and suicidal ideations without plan or intent since separating from his wife about 3 weeks ago. Patient also reported alcohol use, and requested assisted with ceasing his use and exploring option of going to rehabilitation, and [facility].  Patient assessment: On assessment today, the pt reports that their mood is significantly improved since this hospitalization.  Reports that anxiety is also improving on a day by day basis. Sleep is fair. Appetite is fair and also improving.  Concentration is fair.   Energy level is fair but also improving.  Denies suicidal thoughts. Denies suicidal intent and plan.  Denies having any HI.  Denies having psychotic symptoms. Denies AVH, denies paranoia and denies delusional thinking. Denies first rank symptoms and there are no overt signs of psychosis. No TD/EPS type symptoms found on assessment, and pt denies any feelings of stiffness. AIMS: 0. Patient shares that paranoia has improved significantly with the addition of the Abilify  to his medication regimen.  Denies having side effects to current psychiatric medications.  We discussed no changes to current medication regimen, & discussed keeping medications same today with no changes to current regimen to which pt is agreeable.   Discussed the following psychosocial stressors: Recurrent substance abuse.   Patient has completed Ativan  detox taper (07/21 at 2200). Discharge date will be 07/25, as we will keep him at the [facility] and enhance a door  to door transfer to [facility] rehabilitation center to mitigate the risk of relapse. Patient has verbalized concerns regarding feeling like he will relapse if discharged now pending rehab.  PHQ 2-9:  Flowsheet Row ED from 02/28/2024 in [facility]  Thoughts that you would be better off dead, or of hurting yourself in some way Several days  PHQ-9 Total Score 9    Flowsheet Row ED from 02/28/2024 in [facility] ED from 02/27/2024 in [facility] ED from 10/03/2022 in [facility] Emergency Department at [facility]  C-SSRS RISK CATEGORY No Risk Error: Q3, 4, or 5 should not be populated when Q2 is No No Risk     Total Time spent with patient: 1.5 hours  Musculoskeletal  Strength & Muscle Tone: within normal limits Gait & Station: normal Patient leans: N/A  Psychiatric Specialty Exam  Presentation General Appearance:  Appropriate for Environment  Eye Contact: Fair  Speech: Clear and Coherent  Speech Volume: Normal  Handedness: Right   Mood and Affect  Mood: Euthymic  Affect: Congruent   Thought Process  Thought Processes: Coherent  Descriptions of Associations:Intact  Orientation:Full (Time, Place and Person)  Thought Content:Logical  Diagnosis of Schizophrenia or Schizoaffective disorder in past: No   Hallucinations:Hallucinations: None   Ideas of Reference:None  Suicidal Thoughts:Suicidal Thoughts: No   Homicidal Thoughts:Homicidal Thoughts: No    Sensorium  Memory: Immediate Fair  Judgment: Fair  Insight: Fair   Art therapist  Concentration: Fair  Attention Span: Fair  Recall: Fair  Fund of Knowledge: Fair  Language: Fair   Psychomotor Activity  Psychomotor Activity:Psychomotor Activity: Normal  Assets  Assets: Communication Skills   Sleep  Sleep:Sleep: Good    No data recorded  Physical Exam Vitals and nursing  note reviewed.  Eyes:     Pupils: Pupils are equal, round, and reactive to light.  Neurological:     General: No focal deficit present.     Mental Status: He is oriented to person, place, and time.  Psychiatric:        Mood and Affect: Mood normal.    Review of Systems  Psychiatric/Behavioral:  Positive for depression and substance abuse. Negative for hallucinations, memory loss and suicidal ideas. The patient is nervous/anxious and has insomnia.   All other systems reviewed and are negative.   Blood pressure (!) 92/54, pulse 73, temperature 97.9 F (36.6 C), temperature source Oral, resp. rate 18, SpO2 100%. There is no height or weight on file to calculate BMI.  Is the patient at risk to self? Yes  Has the patient been a risk to self in the past 6 months? No .    Has the patient been a risk to self within the distant past? No   Is the patient a risk to others? No   Has the patient been a risk to others in the past 6 months? No   Has the patient been a risk to others within the distant past? No   Last Labs:  Admission on 02/28/2024  Component Date Value Ref Range Status   TSH 03/02/2024 0.726  0.350 - 4.500 uIU/mL Final   Comment: Performed by a 3rd Generation assay with a functional sensitivity of <=0.01 uIU/mL. Performed at [facility], [address] [place], [place] [address]    Cholesterol 03/02/2024 160  0 - 200 mg/dL Final   Triglycerides 92/77/7974 61  <150 mg/dL Final   HDL 92/77/7974 61  >40 mg/dL Final   Total CHOL/HDL Ratio 03/02/2024 2.6  RATIO Final   VLDL 03/02/2024 12  0 - 40 mg/dL Final   LDL Cholesterol 03/02/2024 87  0 - 99 mg/dL Final   Comment:        Total Cholesterol/HDL:CHD Risk Coronary Heart Disease Risk Table                     Men   Women  1/2 Average Risk   3.4   3.3  Average Risk       5.0   4.4  2 X Average Risk   9.6   7.1  3 X Average Risk  23.4   11.0        Use the calculated Patient Ratio above and the CHD Risk Table to  determine the patient's CHD Risk.        ATP III CLASSIFICATION (LDL):  <100     mg/dL   Optimal  899-870  mg/dL   Near or Above                    Optimal  130-159  mg/dL   Borderline  839-810  mg/dL   High  >809     mg/dL   Very High Performed at [facility], [address] [place], [place] [address]    Vit D, 25-Hydroxy 03/02/2024 17.73 (L)  30 - 100 ng/mL Final   Comment: (NOTE) Vitamin D  deficiency has been defined by the Institute of Medicine  and an Endocrine Society practice guideline as a level of serum 25-OH  vitamin D  less than 20 ng/mL (  1,2). The Endocrine Society went on to  further define vitamin D  insufficiency as a level between 21 and 29  ng/mL (2).  1. IOM (Institute of Medicine). 2010. Dietary reference intakes for  calcium and D. [place]: [employer]. 2. Holick MF, [place], Bischoff-Ferrari HA, et al. Evaluation,  treatment, and prevention of vitamin D  deficiency: an Endocrine  Society clinical practice guideline, JCEM. 2011 Jul; 96(7): 1911-30.  Performed at [facility], [address], [place], [place] [address]    Vitamin B-12 03/02/2024 210  180 - 914 pg/mL Final   Comment: (NOTE) This assay is not validated for testing neonatal or myeloproliferative syndrome specimens for Vitamin B12 levels. Performed at [facility], [address] [place], [place] [address]   Admission on 02/27/2024, Discharged on 02/28/2024  Component Date Value Ref Range Status   WBC 02/28/2024 7.7  4.0 - 10.5 K/uL Final   RBC 02/28/2024 4.42  4.22 - 5.81 MIL/uL Final   Hemoglobin 02/28/2024 14.1  13.0 - 17.0 g/dL Final   HCT 92/80/7974 43.4  39.0 - 52.0 % Final   MCV 02/28/2024 98.2  80.0 - 100.0 fL Final   MCH 02/28/2024 31.9  26.0 - 34.0 pg Final   MCHC 02/28/2024 32.5  30.0 - 36.0 g/dL Final   RDW 92/80/7974 14.6  11.5 - 15.5 % Final   Platelets 02/28/2024 327  150 - 400 K/uL Final   nRBC 02/28/2024 0.0  0.0 - 0.2 % Final    Neutrophils Relative % 02/28/2024 59  % Final   Neutro Abs 02/28/2024 4.5  1.7 - 7.7 K/uL Final   Lymphocytes Relative 02/28/2024 26  % Final   Lymphs Abs 02/28/2024 2.0  0.7 - 4.0 K/uL Final   Monocytes Relative 02/28/2024 14  % Final   Monocytes Absolute 02/28/2024 1.1 (H)  0.1 - 1.0 K/uL Final   Eosinophils Relative 02/28/2024 1  % Final   Eosinophils Absolute 02/28/2024 0.0  0.0 - 0.5 K/uL Final   Basophils Relative 02/28/2024 0  % Final   Basophils Absolute 02/28/2024 0.0  0.0 - 0.1 K/uL Final   Immature Granulocytes 02/28/2024 0  % Final   Abs Immature Granulocytes 02/28/2024 0.02  0.00 - 0.07 K/uL Final   Performed at [facility], [address] [place], [place] [address]   Sodium 02/28/2024 137  135 - 145 mmol/L Final   Potassium 02/28/2024 4.3  3.5 - 5.1 mmol/L Final   Chloride 02/28/2024 100  98 - 111 mmol/L Final   CO2 02/28/2024 29  22 - 32 mmol/L Final   Glucose, Bld 02/28/2024 105 (H)  70 - 99 mg/dL Final   Glucose reference range applies only to samples taken after fasting for at least 8 hours.   BUN 02/28/2024 <5 (L)  6 - 20 mg/dL Final   Creatinine, Ser 02/28/2024 0.80  0.61 - 1.24 mg/dL Final   Calcium 92/80/7974 9.9  8.9 - 10.3 mg/dL Final   Total Protein 92/80/7974 7.7  6.5 - 8.1 g/dL Final   Albumin 92/80/7974 4.3  3.5 - 5.0 g/dL Final   AST 92/80/7974 33  15 - 41 U/L Final   ALT 02/28/2024 67 (H)  0 - 44 U/L Final   Alkaline Phosphatase 02/28/2024 54  38 - 126 U/L Final   Total Bilirubin 02/28/2024 0.8  0.0 - 1.2 mg/dL Final   GFR, Estimated 02/28/2024 >60  >60 mL/min Final   Comment: (NOTE) Calculated using the  CKD-EPI Creatinine Equation (2021)    Anion gap 02/28/2024 8  5 - 15 Final   Performed at [facility], [address] [place], [place] [address]   Alcohol, Ethyl (B) 02/28/2024 <15  <15 mg/dL Final   Comment: (NOTE) For medical purposes only. Performed at [facility], [address] [place], [place] [address]    Cholesterol  02/28/2024 202 (H)  0 - 200 mg/dL Final   Triglycerides 92/80/7974 60  <150 mg/dL Final   HDL 92/80/7974 86  >40 mg/dL Final   Total CHOL/HDL Ratio 02/28/2024 2.3  RATIO Final   VLDL 02/28/2024 12  0 - 40 mg/dL Final   LDL Cholesterol 02/28/2024 104 (H)  0 - 99 mg/dL Final   Comment:        Total Cholesterol/HDL:CHD Risk Coronary Heart Disease Risk Table                     Men   Women  1/2 Average Risk   3.4   3.3  Average Risk       5.0   4.4  2 X Average Risk   9.6   7.1  3 X Average Risk  23.4   11.0        Use the calculated Patient Ratio above and the CHD Risk Table to determine the patient's CHD Risk.        ATP III CLASSIFICATION (LDL):  <100     mg/dL   Optimal  899-870  mg/dL   Near or Above                    Optimal  130-159  mg/dL   Borderline  839-810  mg/dL   High  >809     mg/dL   Very High Performed at [facility], [address], [place], [place] [address]    POC Amphetamine UR 02/28/2024 Positive (A)  NONE DETECTED (Cut Off Level 1000 ng/mL) Final   POC Secobarbital (BAR) 02/28/2024 None Detected  NONE DETECTED (Cut Off Level 300 ng/mL) Final   POC Buprenorphine (BUP) 02/28/2024 None Detected  NONE DETECTED (Cut Off Level 10 ng/mL) Final   POC Oxazepam (BZO) 02/28/2024 Positive (A)  NONE DETECTED (Cut Off Level 300 ng/mL) Final   POC Cocaine UR 02/28/2024 None Detected  NONE DETECTED (Cut Off Level 300 ng/mL) Final   POC Methamphetamine UR 02/28/2024 Positive (A)  NONE DETECTED (Cut Off Level 1000 ng/mL) Final   POC Morphine  02/28/2024 None Detected  NONE DETECTED (Cut Off Level 300 ng/mL) Final   POC Methadone UR 02/28/2024 None Detected  NONE DETECTED (Cut Off Level 300 ng/mL) Final   POC Oxycodone  UR 02/28/2024 None Detected  NONE DETECTED (Cut Off Level 100 ng/mL) Final   POC Marijuana UR 02/28/2024 None Detected  NONE DETECTED (Cut Off Level 50 ng/mL) Final   POC Amphetamine UR 02/28/2024 Positive (A)  NONE DETECTED (Cut Off Level 1000 ng/mL) Final    POC Secobarbital (BAR) 02/28/2024 None Detected  NONE DETECTED (Cut Off Level 300 ng/mL) Final   POC Buprenorphine (BUP) 02/28/2024 None Detected  NONE DETECTED (Cut Off Level 10 ng/mL) Final   POC Oxazepam (BZO) 02/28/2024 Positive (A)  NONE DETECTED (Cut Off Level 300 ng/mL) Final   POC Cocaine UR 02/28/2024 None Detected  NONE DETECTED (Cut Off Level 300 ng/mL) Final   POC Methamphetamine UR 02/28/2024 Positive (A)  NONE DETECTED (Cut Off Level 1000 ng/mL) Final  POC Morphine  02/28/2024 None Detected  NONE DETECTED (Cut Off Level 300 ng/mL) Final   POC Methadone UR 02/28/2024 None Detected  NONE DETECTED (Cut Off Level 300 ng/mL) Final   POC Oxycodone  UR 02/28/2024 None Detected  NONE DETECTED (Cut Off Level 100 ng/mL) Final   POC Marijuana UR 02/28/2024 None Detected  NONE DETECTED (Cut Off Level 50 ng/mL) Final    Allergies: Patient has no known allergies.  Medications:  Facility Ordered Medications  Medication   [COMPLETED] thiamine  (VITAMIN B1) injection 100 mg   escitalopram  (LEXAPRO ) tablet 10 mg   acetaminophen  (TYLENOL ) tablet 650 mg   alum & mag hydroxide-simeth (MAALOX/MYLANTA) 200-200-20 MG/5ML suspension 30 mL   magnesium  hydroxide (MILK OF MAGNESIA) suspension 30 mL   haloperidol  (HALDOL ) tablet 5 mg   And   diphenhydrAMINE  (BENADRYL ) capsule 50 mg   haloperidol  lactate (HALDOL ) injection 5 mg   And   diphenhydrAMINE  (BENADRYL ) injection 50 mg   And   LORazepam  (ATIVAN ) injection 2 mg   haloperidol  lactate (HALDOL ) injection 10 mg   And   diphenhydrAMINE  (BENADRYL ) injection 50 mg   And   LORazepam  (ATIVAN ) injection 2 mg   hydrOXYzine  (ATARAX ) tablet 25 mg   traZODone  (DESYREL ) tablet 50 mg   [COMPLETED] LORazepam  (ATIVAN ) tablet 1 mg   Followed by   [COMPLETED] LORazepam  (ATIVAN ) tablet 1 mg   Followed by   [COMPLETED] LORazepam  (ATIVAN ) tablet 1 mg   Followed by   [COMPLETED] LORazepam  (ATIVAN ) tablet 1 mg   thiamine  (VITAMIN B1) tablet 100 mg    ARIPiprazole  (ABILIFY ) tablet 5 mg   Long Term Goals: Improvement in symptoms so as ready for discharge  Short Term Goals: Patient will verbalize feelings in meetings with treatment team members., Patient will attend at least of 50% of the groups daily., Pt will complete the PHQ9 on admission, day 3 and discharge., Patient will participate in completing the [place] Suicide Severity Rating Scale, and Patient will score a low risk of violence for 24 hours prior to discharge  Medical Decision Making  -Continue Admission to the [facility] for treatment and stabilization of mental status. -Continue Abilify  5 mg daily for psychosis -Completed Ativan  taper and CIWA scores as per protocol-please see the MAR for details -Continue Hydroxyzine  25 mg TID PRN for anxiety  -Continue Monitor for signs of withdrawal -Oral thiamine  and MVI replacement -Continue Trazodone  50 mg nightly prn for sleep -Continue Lexapro  10 mg daily for GAD/MDD  Recommendations  - Abstinence from substances encouraged  - SW to look into options for outpatient SA treatment at discharge  -Q15 minute checks per unit protocol   Donia Snell, NP 03/03/24  12:21 PM

## 2024-03-03 NOTE — ED Notes (Signed)
 Patient is sleeping. Respirations equal and unlabored. No change in assessment or acuity. Routine safety checks conducted according to facility protocol.

## 2024-03-03 NOTE — Group Note (Signed)
 Group Topic: Recovery Basics  Group Date: 03/03/2024 Start Time: 1000 End Time: 1100 Facilitators: Nicholaus Arlyne BIRCH, NT  Department: [facility]  Number of Participants: 5  Group Focus: substance abuse education Treatment Modality:  Spiritual Interventions utilized were clarification Purpose: increase insight  Name: Eddie Smith Date of Birth: 1980/12/12  MR: [id number]    Level of Participation: active Quality of Participation: cooperative Interactions with others: positive Mood/Affect: appropriate Triggers (if applicable): none Cognition: coherent/clear Progress: Gaining insight Response: good Plan: patient will be encouraged to continue recovery process  Patients Problems:  Patient Active Problem List   Diagnosis Date Noted   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

## 2024-03-03 NOTE — ED Notes (Signed)
 Patient observed/assessed at bedside lying in bed asleep. Patient alert and oriented to self and location. Affect is flat and patient seems agitated to be disturbed from sleeping. Patient denies anxiety but endorses headache in which he would just wants to rest.  He denies A/V/H. He denies having any thoughts/plan of self harm and harm towards others. Verbalizes no further complaints at this time.

## 2024-03-03 NOTE — Group Note (Signed)
 Group Topic: Communication  Group Date: 03/03/2024 Start Time: 0900 End Time: 1000 Facilitators: Herold Lajuana NOVAK, RN  Department: [facility]  Number of Participants: 8  Group Focus: communication Treatment Modality:  Individual Therapy Interventions utilized were patient education Purpose: increase insight  Name: Eddie Smith Date of Birth: [birth year]  MR: [id number]      Patients Problems:  Level of Participation: active Quality of Participation: cooperative Interactions with others: gave feedback Mood/Affect: appropriate Triggers (if applicable): none identified Cognition: coherent/clear and goal directed Progress: Significant Response: Pt verbalized understanding of all medications administered Plan: patient will be encouraged to remain med compliant throughout tx and to notify staff with any questions or concerns

## 2024-03-03 NOTE — ED Notes (Signed)
 Patient A&Ox4. Denies intent to harm self/others when asked. Denies A/VH. Patient denies any physical complaints when asked. No acute distress noted. Support and encouragement provided. Routine safety checks conducted according to facility protocol. Encouraged patient to notify staff if thoughts of harm toward self or others arise. Patient verbalize understanding and agreement. Will continue to monitor for safety.

## 2024-03-03 NOTE — Group Note (Signed)
 Group Topic: Feelings about Diagnosis  Group Date: 03/03/2024 Start Time: 2000 End Time: 2130 Facilitators: Joan Plowman B  Department: [facility]  Number of Participants: 6  Group Focus: abuse issues, acceptance, activities of daily living skills, chemical dependency issues, coping skills, daily focus, depression, discharge education, healthy friendships, personal responsibility, relapse prevention, relaxation, self-esteem, social skills, and substance abuse education Treatment Modality:  Leisure Development Interventions utilized were leisure development and patient education Purpose: express feelings, increase insight, and relapse prevention strategies  Name: Eddie Smith Date of Birth: July 19, 1981  MR: [id number]    Level of Participation: active Quality of Participation: attentive, cooperative, initiates communication, motivated, and offered feedback Interactions with others: gave feedback Mood/Affect: appropriate, brightens with interaction, and positive Triggers (if applicable): NA Cognition: coherent/clear Progress: Gaining insight Response: NA Plan: patient will be encouraged to keep going to groups.   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

## 2024-03-03 NOTE — ED Notes (Signed)
 Pt sleeping in no acute distress. RR even and unlabored. Environment secured. Will continue to monitor for safety.

## 2024-03-03 NOTE — Discharge Planning (Signed)
 SW spoke with patient for update on his approval to [facility] this morning. Patient made SW aware that he had been calling number provided for his parole officer and it did not work. SW made call to 2 different numbers listed for WS probation and parole that were no longer in service. SW was able to reach them at [phone] and was given direct number for Ms. Floretta @ [phone].   SW made call and left voice mail for her to return call to SW to make aware of patients current location as well as plans for residential treatment upon DC to [facility] on Friday.  Will continue to follow.

## 2024-03-03 NOTE — Discharge Planning (Signed)
 SW received call from [facility] [place] [facility] [place] with patient approval for admission to [facility] residential treatment program.    Patient will be discharging to [facility] on Friday July 25 at 11:00 am with transportation provided via [facility]    Patient will be transported via cab for pick up at 10:30. He will be provided with 30 days of scripts and 14 days of medications.   Location of facility is [address] [place], [place]. Number to call for emergency: [phone]

## 2024-03-03 NOTE — Discharge Instructions (Signed)
 Referral was received by [facility] and per Admissions, patient has been accepted to their facility for admission. Patient can admit to their facility on Friday, July 25 @ 11:00. [facility] will provide transport via [employer] or [employer] to facility.  Transport will be arranged for pick up at 10:30.  Patient will need 30 day scripts and a 7 day supply of medication and clothing.  No other needs to report at this time.   Stephane Posner, LCSW Clinical Social Worker [place] Ph: [phone]

## 2024-03-03 NOTE — ED Notes (Signed)
Pt is sleeping, no acute distress noted.

## 2024-03-04 DIAGNOSIS — R45851 Suicidal ideations: Secondary | ICD-10-CM | POA: Diagnosis not present

## 2024-03-04 DIAGNOSIS — G4700 Insomnia, unspecified: Secondary | ICD-10-CM | POA: Diagnosis not present

## 2024-03-04 DIAGNOSIS — F1010 Alcohol abuse, uncomplicated: Secondary | ICD-10-CM | POA: Diagnosis not present

## 2024-03-04 DIAGNOSIS — I999 Unspecified disorder of circulatory system: Secondary | ICD-10-CM | POA: Diagnosis not present

## 2024-03-04 MED ORDER — ONDANSETRON 4 MG PO TBDP
4.0000 mg | ORAL_TABLET | Freq: Once | ORAL | Status: AC
  Administered 2024-03-04: 4 mg via ORAL
  Filled 2024-03-04: qty 1

## 2024-03-04 NOTE — Group Note (Signed)
 Group Topic: Coping Skills  Group Date: 03/04/2024 Start Time: 1500 End Time: 1600 Facilitators: [place] Stephane HERO, [place]  Department: [facility]  Number of Participants: 9  Group Focus: anger management, anxiety, clarity of thought, coping skills, and feeling awareness/expression Treatment Modality:  Psychoeducation Interventions utilized were clarification, exploration, patient education, and problem solving Purpose: enhance coping skills, explore maladaptive thinking, express feelings, increase insight, reinforce self-care, relapse prevention strategies, and trigger / craving management  Name: Eddie Smith Date of Birth: [birth year]  MR: [id number]    Level of Participation: active Quality of Participation: attentive, cooperative, motivated, and offered feedback Interactions with others: gave feedback Mood/Affect: brightens with interaction and positive Triggers (if applicable): n/a Cognition: coherent/clear and goal directed Progress: Significant Response: gave supportive feedback through out group session Plan: referral / recommendations  Patients Problems:  Patient Active Problem List   Diagnosis Date Noted   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

## 2024-03-04 NOTE — Group Note (Signed)
 Group Topic: Wellness  Group Date: 03/04/2024 Start Time: 1800 End Time: 1830 Facilitators: Daved Tinnie HERO, RN  Department: [facility]  Number of Participants: 10  Group Focus: nursing group Treatment Modality:  Psychoeducation Interventions utilized were patient education Purpose: reinforce self-care  Name: Eddie Smith Date of Birth: 09-15-1980  MR: [id number]    Level of Participation: moderate Quality of Participation: cooperative Interactions with others: gave feedback Mood/Affect: appropriate Triggers (if applicable): n/a Cognition: coherent/clear Progress: Gaining insight Response: RN reviewed medications with pt, questions denied Plan: patient will be encouraged to attend future RN education groups  Patients Problems:  Patient Active Problem List   Diagnosis Date Noted   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

## 2024-03-04 NOTE — Progress Notes (Signed)
 Patient resting quietly in bed with eyes closed with unlabored breathing. Q 15 minute safety checks remain in place.  Pt remains safe on the unit at this time.

## 2024-03-04 NOTE — ED Notes (Signed)
 Pt administered maalox for c/o nausea and possible indigestion. Pt affirms to have eaten dinner.

## 2024-03-04 NOTE — ED Notes (Signed)
 Pt is in the dayroom watching TV with peers. Pt denies SI/HI/AVH. Pt complained of nausea and agreed on taking zofran . Pt has no further complain.No acute distress noted.

## 2024-03-04 NOTE — Group Note (Signed)
 Group Topic: Overcoming Obstacles  Group Date: 03/04/2024 Start Time: 0800 End Time: 0900 Facilitators: Lonzell Dwayne RAMAN, NT  Department: [facility]  Number of Participants: 7  Group Focus: goals/reality orientation Treatment Modality:  Patient-Centered Therapy Interventions utilized were mental fitness Purpose: enhance coping skills  Name: Eddie Smith Date of Birth: 27-Oct-1980  MR: [id number]    Level of Participation: Patient Attended groupmoderate Quality of Participation: attentive Interactions with others: gave feedback Mood/Affect: positive Triggers (if applicable): N/A Cognition: coherent/clear Progress: Moderate Response Appropriate  Plan: patient will be encouraged to keep the good attitude he has and not give up on himself.  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

## 2024-03-04 NOTE — ED Provider Notes (Signed)
 Behavioral Health Progress Note  Date and Time: 03/04/2024 1:40 PM Name: Eddie Smith MRN:  [id number]  Subjective:  Eddie Smith was seen in the group room today. He has been visible and attending groups. He is aware of the discharge plan to go to [facility] on Friday. He is both excited and nervous about it. He is having some issues with appetite fluctuation in the evening.   Diagnosis:  Final diagnoses:  Alcohol use disorder  History of cocaine use  Cocaine-induced vascular disorder (HCC)    Total Time spent with patient: 15 minutes  Past Psychiatric History: Alcohol abuse   Past Medical History: Denies any significant medical history   Family History: Denies any family history   Social History: Patient reports that he was born in [place], [place] .  He reports that he moved to [place], [place] for work in 2010, married to his wife with whom he recently separated while he was homeless at one point.  Patient reports that his children still reside in [place] , shares that he has 6 children ages 68, 13, 71, 50, 79 and 34.  Reports highest level of education is 12th grade,   Sleep: Good  Appetite:  Fair  Current Medications:  Current Facility-Administered Medications  Medication Dose Route Frequency Provider Last Rate Last Admin   acetaminophen  (TYLENOL ) tablet 650 mg  650 mg Oral Q6H PRN Hoang, Daniela B, MD   650 mg at 03/04/24 0934   alum & mag hydroxide-simeth (MAALOX/MYLANTA) 200-200-20 MG/5ML suspension 30 mL  30 mL Oral Q4H PRN Hoang, Daniela B, MD       ARIPiprazole  (ABILIFY ) tablet 5 mg  5 mg Oral Daily Nkwenti, Doris, NP   5 mg at 03/04/24 0933   haloperidol  (HALDOL ) tablet 5 mg  5 mg Oral TID PRN Hoang, Daniela B, MD       And   diphenhydrAMINE  (BENADRYL ) capsule 50 mg  50 mg Oral TID PRN Hoang, Daniela B, MD       haloperidol  lactate (HALDOL ) injection 5 mg  5 mg Intramuscular TID PRN Hoang, Daniela B, MD       And   diphenhydrAMINE  (BENADRYL ) injection  50 mg  50 mg Intramuscular TID PRN Hoang, Daniela B, MD       And   LORazepam  (ATIVAN ) injection 2 mg  2 mg Intramuscular TID PRN Hoang, Daniela B, MD       haloperidol  lactate (HALDOL ) injection 10 mg  10 mg Intramuscular TID PRN Hoang, Daniela B, MD       And   diphenhydrAMINE  (BENADRYL ) injection 50 mg  50 mg Intramuscular TID PRN Hoang, Daniela B, MD       And   LORazepam  (ATIVAN ) injection 2 mg  2 mg Intramuscular TID PRN Hoang, Daniela B, MD       escitalopram  (LEXAPRO ) tablet 10 mg  10 mg Oral QHS Hoang, Daniela B, MD   10 mg at 03/03/24 2120   hydrOXYzine  (ATARAX ) tablet 25 mg  25 mg Oral TID PRN Hoang, Daniela B, MD       magnesium  hydroxide (MILK OF MAGNESIA) suspension 30 mL  30 mL Oral Daily PRN Hoang, Daniela B, MD       thiamine  (VITAMIN B1) tablet 100 mg  100 mg Oral Daily Hoang, Daniela B, MD   100 mg at 03/04/24 0933   traZODone  (DESYREL ) tablet 50 mg  50 mg Oral QHS PRN Hoang, Daniela B, MD   50 mg at 03/03/24 2121  Vitamin D  (Ergocalciferol ) (DRISDOL ) 1.25 MG (50000 UNIT) capsule 50,000 Units  50,000 Units Oral Q7 days Tex Drilling, NP   50,000 Units at 03/03/24 1400   Current Outpatient Medications  Medication Sig Dispense Refill   albuterol  (VENTOLIN  HFA) 108 (90 Base) MCG/ACT inhaler Inhale 1 puff into the lungs every 6 (six) hours as needed for wheezing or shortness of breath.     aspirin  EC 81 MG tablet Take 81 mg by mouth daily. Swallow whole.      Labs  Lab Results:  Admission on 02/28/2024  Component Date Value Ref Range Status   TSH 03/02/2024 0.726  0.350 - 4.500 uIU/mL Final   Comment: Performed by a 3rd Generation assay with a functional sensitivity of <=0.01 uIU/mL. Performed at [facility], [address], [place], [place] [address]    Cholesterol 03/02/2024 160  0 - 200 mg/dL Final   Triglycerides 92/77/7974 61  <150 mg/dL Final   HDL 92/77/7974 61  >40 mg/dL Final   Total CHOL/HDL Ratio 03/02/2024 2.6  RATIO Final   VLDL 03/02/2024 12  0 -  40 mg/dL Final   LDL Cholesterol 03/02/2024 87  0 - 99 mg/dL Final   Comment:        Total Cholesterol/HDL:CHD Risk Coronary Heart Disease Risk Table                     Men   Women  1/2 Average Risk   3.4   3.3  Average Risk       5.0   4.4  2 X Average Risk   9.6   7.1  3 X Average Risk  23.4   11.0        Use the calculated Patient Ratio above and the CHD Risk Table to determine the patient's CHD Risk.        ATP III CLASSIFICATION (LDL):  <100     mg/dL   Optimal  899-870  mg/dL   Near or Above                    Optimal  130-159  mg/dL   Borderline  839-810  mg/dL   High  >809     mg/dL   Very High Performed at [facility], [address] [place], [place] [address]    Vit D, 25-Hydroxy 03/02/2024 17.73 (L)  30 - 100 ng/mL Final   Comment: (NOTE) Vitamin D  deficiency has been defined by the Institute of Medicine  and an Endocrine Society practice guideline as a level of serum 25-OH  vitamin D  less than 20 ng/mL (1,2). The Endocrine Society went on to  further define vitamin D  insufficiency as a level between 21 and 29  ng/mL (2).  1. IOM (Institute of Medicine). 2010. Dietary reference intakes for  calcium and D. [place]: [employer]. 2. Holick MF, Binkley [place], Bischoff-Ferrari HA, et al. Evaluation,  treatment, and prevention of vitamin D  deficiency: an Endocrine  Society clinical practice guideline, JCEM. 2011 Jul; 96(7): 1911-30.  Performed at [facility], [address], [place], [place] [address]    Vitamin B-12 03/02/2024 210  180 - 914 pg/mL Final   Comment: (NOTE) This assay is not validated for testing neonatal or myeloproliferative syndrome specimens for Vitamin B12 levels. Performed at [facility], [address], [place], [place] [address]   Admission on 02/27/2024, Discharged on 02/28/2024  Component Date Value  Ref Range Status   WBC 02/28/2024 7.7  4.0 - 10.5 K/uL Final   RBC 02/28/2024 4.42  4.22 - 5.81  MIL/uL Final   Hemoglobin 02/28/2024 14.1  13.0 - 17.0 g/dL Final   HCT 92/80/7974 43.4  39.0 - 52.0 % Final   MCV 02/28/2024 98.2  80.0 - 100.0 fL Final   MCH 02/28/2024 31.9  26.0 - 34.0 pg Final   MCHC 02/28/2024 32.5  30.0 - 36.0 g/dL Final   RDW 92/80/7974 14.6  11.5 - 15.5 % Final   Platelets 02/28/2024 327  150 - 400 K/uL Final   nRBC 02/28/2024 0.0  0.0 - 0.2 % Final   Neutrophils Relative % 02/28/2024 59  % Final   Neutro Abs 02/28/2024 4.5  1.7 - 7.7 K/uL Final   Lymphocytes Relative 02/28/2024 26  % Final   Lymphs Abs 02/28/2024 2.0  0.7 - 4.0 K/uL Final   Monocytes Relative 02/28/2024 14  % Final   Monocytes Absolute 02/28/2024 1.1 (H)  0.1 - 1.0 K/uL Final   Eosinophils Relative 02/28/2024 1  % Final   Eosinophils Absolute 02/28/2024 0.0  0.0 - 0.5 K/uL Final   Basophils Relative 02/28/2024 0  % Final   Basophils Absolute 02/28/2024 0.0  0.0 - 0.1 K/uL Final   Immature Granulocytes 02/28/2024 0  % Final   Abs Immature Granulocytes 02/28/2024 0.02  0.00 - 0.07 K/uL Final   Performed at [facility], [address] [place], [place] [address]   Sodium 02/28/2024 137  135 - 145 mmol/L Final   Potassium 02/28/2024 4.3  3.5 - 5.1 mmol/L Final   Chloride 02/28/2024 100  98 - 111 mmol/L Final   CO2 02/28/2024 29  22 - 32 mmol/L Final   Glucose, Bld 02/28/2024 105 (H)  70 - 99 mg/dL Final   Glucose reference range applies only to samples taken after fasting for at least 8 hours.   BUN 02/28/2024 <5 (L)  6 - 20 mg/dL Final   Creatinine, Ser 02/28/2024 0.80  0.61 - 1.24 mg/dL Final   Calcium 92/80/7974 9.9  8.9 - 10.3 mg/dL Final   Total Protein 92/80/7974 7.7  6.5 - 8.1 g/dL Final   Albumin 92/80/7974 4.3  3.5 - 5.0 g/dL Final   AST 92/80/7974 33  15 - 41 U/L Final   ALT 02/28/2024 67 (H)  0 - 44 U/L Final   Alkaline Phosphatase 02/28/2024 54  38 - 126 U/L Final   Total Bilirubin 02/28/2024 0.8  0.0 - 1.2 mg/dL Final   GFR, Estimated 02/28/2024 >60  >60 mL/min Final    Comment: (NOTE) Calculated using the CKD-EPI Creatinine Equation (2021)    Anion gap 02/28/2024 8  5 - 15 Final   Performed at [facility], [address], [place], [place] [address]   Alcohol, Ethyl (B) 02/28/2024 <15  <15 mg/dL Final   Comment: (NOTE) For medical purposes only. Performed at [facility], [address], [place], [place] [address]    Cholesterol 02/28/2024 202 (H)  0 - 200 mg/dL Final   Triglycerides 92/80/7974 60  <150 mg/dL Final   HDL 92/80/7974 86  >40 mg/dL Final   Total CHOL/HDL Ratio 02/28/2024 2.3  RATIO Final   VLDL 02/28/2024 12  0 - 40 mg/dL Final   LDL Cholesterol 02/28/2024 104 (H)  0 - 99 mg/dL Final   Comment:        Total Cholesterol/HDL:CHD Risk Coronary Heart Disease Risk  Table                     Men   Women  1/2 Average Risk   3.4   3.3  Average Risk       5.0   4.4  2 X Average Risk   9.6   7.1  3 X Average Risk  23.4   11.0        Use the calculated Patient Ratio above and the CHD Risk Table to determine the patient's CHD Risk.        ATP III CLASSIFICATION (LDL):  <100     mg/dL   Optimal  899-870  mg/dL   Near or Above                    Optimal  130-159  mg/dL   Borderline  839-810  mg/dL   High  >809     mg/dL   Very High Performed at [facility], [address] [place], [place] [address]    POC Amphetamine UR 02/28/2024 Positive (A)  NONE DETECTED (Cut Off Level 1000 ng/mL) Final   POC Secobarbital (BAR) 02/28/2024 None Detected  NONE DETECTED (Cut Off Level 300 ng/mL) Final   POC Buprenorphine (BUP) 02/28/2024 None Detected  NONE DETECTED (Cut Off Level 10 ng/mL) Final   POC Oxazepam (BZO) 02/28/2024 Positive (A)  NONE DETECTED (Cut Off Level 300 ng/mL) Final   POC Cocaine UR 02/28/2024 None Detected  NONE DETECTED (Cut Off Level 300 ng/mL) Final   POC Methamphetamine UR 02/28/2024 Positive (A)  NONE DETECTED (Cut Off Level 1000 ng/mL) Final   POC Morphine  02/28/2024 None Detected  NONE DETECTED (Cut Off  Level 300 ng/mL) Final   POC Methadone UR 02/28/2024 None Detected  NONE DETECTED (Cut Off Level 300 ng/mL) Final   POC Oxycodone  UR 02/28/2024 None Detected  NONE DETECTED (Cut Off Level 100 ng/mL) Final   POC Marijuana UR 02/28/2024 None Detected  NONE DETECTED (Cut Off Level 50 ng/mL) Final   POC Amphetamine UR 02/28/2024 Positive (A)  NONE DETECTED (Cut Off Level 1000 ng/mL) Final   POC Secobarbital (BAR) 02/28/2024 None Detected  NONE DETECTED (Cut Off Level 300 ng/mL) Final   POC Buprenorphine (BUP) 02/28/2024 None Detected  NONE DETECTED (Cut Off Level 10 ng/mL) Final   POC Oxazepam (BZO) 02/28/2024 Positive (A)  NONE DETECTED (Cut Off Level 300 ng/mL) Final   POC Cocaine UR 02/28/2024 None Detected  NONE DETECTED (Cut Off Level 300 ng/mL) Final   POC Methamphetamine UR 02/28/2024 Positive (A)  NONE DETECTED (Cut Off Level 1000 ng/mL) Final   POC Morphine  02/28/2024 None Detected  NONE DETECTED (Cut Off Level 300 ng/mL) Final   POC Methadone UR 02/28/2024 None Detected  NONE DETECTED (Cut Off Level 300 ng/mL) Final   POC Oxycodone  UR 02/28/2024 None Detected  NONE DETECTED (Cut Off Level 100 ng/mL) Final   POC Marijuana UR 02/28/2024 None Detected  NONE DETECTED (Cut Off Level 50 ng/mL) Final    Blood Alcohol level:  Lab Results  Component Value Date   ETH <15 02/28/2024   ETH 125 (H) 07/04/2022    Metabolic Disorder Labs: Lab Results  Component Value Date   HGBA1C 5.2 03/27/2022   MPG 102.54 03/27/2022   No results found for: PROLACTIN Lab Results  Component Value Date   CHOL 160 03/02/2024   TRIG 61 03/02/2024   HDL 61 03/02/2024   CHOLHDL 2.6 03/02/2024  VLDL 12 03/02/2024   LDLCALC 87 03/02/2024   LDLCALC 104 (H) 02/28/2024    Therapeutic Lab Levels: No results found for: LITHIUM No results found for: VALPROATE No results found for: CBMZ  Physical Findings   CAGE-AID    Flowsheet Row ED to Hosp-Admission (Discharged) from 03/27/2022 in [facility]  CAGE-AID Score 3   PHQ2-9    Flowsheet Row ED from 02/28/2024 in [facility]  PHQ-2 Total Score 2  PHQ-9 Total Score 9   Flowsheet Row ED from 02/28/2024 in [facility] ED from 02/27/2024 in [facility] ED from 10/03/2022 in [facility] Emergency Department at [facility]  C-SSRS RISK CATEGORY No Risk Error: Q3, 4, or 5 should not be populated when Q2 is No No Risk     Musculoskeletal  Strength & Muscle Tone: within normal limits Gait & Station: normal Patient leans: N/A  Psychiatric Specialty Exam  Presentation  General Appearance:  Casual  Eye Contact: Fair  Speech: Normal Rate  Speech Volume: Normal  Handedness: Right   Mood and Affect  Mood: Anxious  Affect: Appropriate   Thought Process  Thought Processes: Linear  Descriptions of Associations:Intact  Orientation:Full (Time, Place and Person)  Thought Content:Logical  Diagnosis of Schizophrenia or Schizoaffective disorder in past: No    Hallucinations:Hallucinations: None  Ideas of Reference:None  Suicidal Thoughts:Suicidal Thoughts: No  Homicidal Thoughts:Homicidal Thoughts: No   Sensorium  Memory: Immediate Good; Recent Good; Remote Fair  Judgment: Fair  Insight: Fair   Art therapist  Concentration: Fair  Attention Span: Good  Recall: Good  Fund of Knowledge: Good  Language: Good   Psychomotor Activity  Psychomotor Activity: Psychomotor Activity: Normal   Assets  Assets: Communication Skills; Desire for Improvement; Leisure Time   Sleep  Sleep: Sleep: Good  Estimated Sleeping Duration (Last 24 Hours): 10.00-11.75 hours  No data recorded  Physical Exam  Physical Exam Vitals and nursing note reviewed.  Constitutional:      Appearance: Normal appearance.  HENT:     Head: Normocephalic.  Eyes:     Extraocular Movements:  Extraocular movements intact.  Pulmonary:     Effort: Pulmonary effort is normal.  Musculoskeletal:        General: Normal range of motion.     Cervical back: Normal range of motion.  Neurological:     General: No focal deficit present.     Mental Status: He is alert and oriented to person, place, and time.  Psychiatric:        Behavior: Behavior normal.    Review of Systems  Constitutional:  Negative for chills and fever.  Musculoskeletal:  Negative for joint pain and myalgias.  Neurological:  Positive for headaches. Negative for dizziness.  Psychiatric/Behavioral:  Negative for hallucinations and suicidal ideas.    Blood pressure 99/68, pulse 73, temperature 97.9 F (36.6 C), temperature source Oral, resp. rate 17, SpO2 98%. There is no height or weight on file to calculate BMI.  Treatment Plan Summary:  Long Term Goals: Improvement in symptoms so as ready for discharge   Short Term Goals: Patient will verbalize feelings in meetings with treatment team members., Patient will attend at least of 50% of the groups daily., Pt will complete the PHQ9 on admission, day 3 and discharge., Patient will participate in completing the [place] Suicide Severity Rating Scale, and Patient will score a low risk of violence for 24 hours prior to discharge  Medical Decision Making  -Continue Ad[facility] to the [facility] for treatment and stabilization of mental status. -Continue Abilify  5 mg daily for psychosis -Completed Ativan  taper and CIWA scores as per protocol-please see the MAR for details -Continue Hydroxyzine  25 mg TID PRN for anxiety  -Continue Monitor for signs of withdrawal -Oral thiamine  and MVI replacement -Continue Trazodone  50 mg nightly prn for sleep -Continue Lexapro  10 mg daily for GAD/MDD   Recommendations  - Abstinence from substances encouraged  - SW to look into options for outpatient SA treatment at  discharge  -Q15 minute checks per unit protocol    Corean Anette Potters, MD 03/04/2024 1:40 PM

## 2024-03-04 NOTE — ED Notes (Signed)
 Pt currently attending sw group in dayroom, no distress observed. Pt has generally isolated in room through out shift

## 2024-03-04 NOTE — ED Notes (Signed)
 Patient is sleeping. Respirations equal and unlabored. No change in assessment or acuity. Routine safety checks conducted according to facility protocol.

## 2024-03-04 NOTE — ED Notes (Signed)
 Pt administered prn tylenol  for c/o level 7 headache. Pt denies si hi and avh- verbal contract for safety provided. RN reviewed medications with pt. Pt affirms to have eating breakfast but reports that he has a poor appetite. Pt says he feel 'good' , but also reports feelings of hopelessness.

## 2024-03-05 DIAGNOSIS — I999 Unspecified disorder of circulatory system: Secondary | ICD-10-CM | POA: Diagnosis not present

## 2024-03-05 DIAGNOSIS — G4700 Insomnia, unspecified: Secondary | ICD-10-CM | POA: Diagnosis not present

## 2024-03-05 DIAGNOSIS — F1010 Alcohol abuse, uncomplicated: Secondary | ICD-10-CM | POA: Diagnosis not present

## 2024-03-05 DIAGNOSIS — R45851 Suicidal ideations: Secondary | ICD-10-CM | POA: Diagnosis not present

## 2024-03-05 DIAGNOSIS — F1510 Other stimulant abuse, uncomplicated: Secondary | ICD-10-CM | POA: Diagnosis present

## 2024-03-05 MED ORDER — ARIPIPRAZOLE 5 MG PO TABS: 5.0000 mg | ORAL_TABLET | Freq: Every day | ORAL | 0 refills | Status: AC

## 2024-03-05 MED ORDER — STRESS FORMULA/ZINC PO TABS: 1.0000 | ORAL_TABLET | Freq: Every day | ORAL | 0 refills | Status: AC

## 2024-03-05 MED ORDER — TRAZODONE HCL 50 MG PO TABS: 50.0000 mg | ORAL_TABLET | Freq: Every evening | ORAL | 0 refills | Status: AC | PRN

## 2024-03-05 MED ORDER — ESCITALOPRAM OXALATE 10 MG PO TABS: 10.0000 mg | ORAL_TABLET | Freq: Every day | ORAL | 0 refills | Status: AC

## 2024-03-05 MED ORDER — HYDROXYZINE HCL 25 MG PO TABS: 25.0000 mg | ORAL_TABLET | Freq: Three times a day (TID) | ORAL | 0 refills | Status: AC | PRN

## 2024-03-05 NOTE — ED Notes (Signed)
 Pt is sleeping . No acute distress noted.

## 2024-03-05 NOTE — ED Notes (Signed)
 Pt reports depression and anxiety that comes and goes. Pt denies desire or plan to harm self and others- verbal contract for safety provided. Pt c/o trouble sleeping and feeling nauseated during lunch and dinner time. Pt ate breakfast. Pt denies pain and physical discomforts.

## 2024-03-05 NOTE — ED Provider Notes (Signed)
 FBC/OBS ASAP Discharge Summary  Date and Time: 03/05/2024 9:55 AM  Name: Eddie Smith  MRN:  [id number]   Discharge Diagnoses:  Final diagnoses:  Alcohol use disorder  History of cocaine use  Cocaine-induced vascular disorder (HCC)    Subjective: per H&P 02/29/24 Eddie Smith 43 y/o male patient presented to [facility] as a walk in voluntarily accompanied by [employer] with complaints of worsening depression, anxiety symptoms and suicidal ideations without plan or intent since separating from his wife about 3 weeks ago. Patient also reported alcohol use, and requested assisted with ceasing his use and exploring option of going to rehabilitation, and was admitted to the [facility].   Stay Summary: During the course of patient's hospitalization, the 15-minute checks were adequate to ensure patient's safety. Patient did not exhibit erratic or aggressive behavior and was compliant with scheduled medication. Patient was recommended for outpatient psychiatry follow-up.  At the time of discharge patient is not reporting any acute suicidal/homicidal ideations/AVH, delusional thoughts or paranoia. Patient did not appear to be responding to any internal stimuli. Patient feels more confident about self-care & in managing their mental health problems. Patient currently denies any new issues or concerns. Education and supportive counseling provided throughout patient's hospital stay & upon discharge.  patient was safely detoxed fom alcohol and stimulants during the course of his stay, and any symptoms of withdrawal were addressed and have since resolved. At time of discharge the patient is not experiencing clinical syptoms of withdrawal and denies having any subjective symptoms. At time of discharge CIWA was 0.   Today upon discharge evaluation, the patient gives a mood of appropriate to circumstances. Patient denies any specific concerns and has no new physical complaints.  Patient slept well, appetite good, regular bowel movements. Patient feels that the medications have been helpful & is in agreement to continue current treatment regimen as recommended. Patient was able to engage in safety planning including plan to return to [facility] the nearest emergency room or contact emergency services if patient feels unable to maintain their own safety or the safety of others. Patient had no further questions, comments, or concerns. Patient left [facility]) with all personal belongings in no apparent distress. Transportation per safe transport to home was arranged for patient.  Total Time spent with patient: 20 minutes  Past Psychiatric History: Alcohol abuse   Past Medical History: HTN, GERD, CVA   Family History: Denies any family history   Social History: Patient reports that he was born in [place], [place] .  He reports that he moved to [place], [place] for work in 2010, married to his wife with whom he recently separated while he was homeless at one point.  Patient reports that his children still reside in [place] , shares that he has 6 children ages 58, 74, 90, 52, 47 and 74.  Reports highest level of education is 12th grade Tobacco Cessation:  A prescription for an FDA-approved tobacco cessation medication was offered at discharge and the patient refused  Current Medications:  Current Facility-Administered Medications  Medication Dose Route Frequency Provider Last Rate Last Admin   acetaminophen  (TYLENOL ) tablet 650 mg  650 mg Oral Q6H PRN Hoang, Daniela B, MD   650 mg at 03/04/24 0934   alum & mag hydroxide-simeth (MAALOX/MYLANTA) 200-200-20 MG/5ML suspension 30 mL  30 mL Oral Q4H PRN Hoang, Daniela B, MD   30 mL at 03/04/24 1730  ARIPiprazole  (ABILIFY ) tablet 5 mg  5 mg Oral Daily Tex Drilling, NP   5 mg at 03/05/24 0920   haloperidol  (HALDOL ) tablet 5 mg  5 mg Oral TID PRN Hoang, Daniela B, MD        And   diphenhydrAMINE  (BENADRYL ) capsule 50 mg  50 mg Oral TID PRN Hoang, Daniela B, MD       haloperidol  lactate (HALDOL ) injection 5 mg  5 mg Intramuscular TID PRN Hoang, Daniela B, MD       And   diphenhydrAMINE  (BENADRYL ) injection 50 mg  50 mg Intramuscular TID PRN Hoang, Daniela B, MD       And   LORazepam  (ATIVAN ) injection 2 mg  2 mg Intramuscular TID PRN Hoang, Daniela B, MD       haloperidol  lactate (HALDOL ) injection 10 mg  10 mg Intramuscular TID PRN Hoang, Daniela B, MD       And   diphenhydrAMINE  (BENADRYL ) injection 50 mg  50 mg Intramuscular TID PRN Hoang, Daniela B, MD       And   LORazepam  (ATIVAN ) injection 2 mg  2 mg Intramuscular TID PRN Hoang, Daniela B, MD       escitalopram  (LEXAPRO ) tablet 10 mg  10 mg Oral QHS Hoang, Daniela B, MD   10 mg at 03/04/24 2125   hydrOXYzine  (ATARAX ) tablet 25 mg  25 mg Oral TID PRN Hoang, Daniela B, MD       magnesium  hydroxide (MILK OF MAGNESIA) suspension 30 mL  30 mL Oral Daily PRN Hoang, Daniela B, MD       thiamine  (VITAMIN B1) tablet 100 mg  100 mg Oral Daily Hoang, Daniela B, MD   100 mg at 03/05/24 9079   traZODone  (DESYREL ) tablet 50 mg  50 mg Oral QHS PRN Hoang, Daniela B, MD   50 mg at 03/04/24 2125   Vitamin D  (Ergocalciferol ) (DRISDOL ) 1.25 MG (50000 UNIT) capsule 50,000 Units  50,000 Units Oral Q7 days Tex Drilling, NP   50,000 Units at 03/03/24 1400   Current Outpatient Medications  Medication Sig Dispense Refill   albuterol  (VENTOLIN  HFA) 108 (90 Base) MCG/ACT inhaler Inhale 1 puff into the lungs every 6 (six) hours as needed for wheezing or shortness of breath.     Multiple Vitamins-Minerals (B COMPLEX-C-E-ZINC) tablet Take 1 tablet by mouth daily. 30 tablet 0   [START ON 03/06/2024] ARIPiprazole  (ABILIFY ) 5 MG tablet Take 1 tablet (5 mg total) by mouth daily. 30 tablet 0   aspirin  EC 81 MG tablet Take 81 mg by mouth daily. Swallow whole.     escitalopram  (LEXAPRO ) 10 MG tablet Take 1 tablet (10 mg total) by mouth at  bedtime. 30 tablet 0   hydrOXYzine  (ATARAX ) 25 MG tablet Take 1 tablet (25 mg total) by mouth 3 (three) times daily as needed for anxiety. 30 tablet 0   traZODone  (DESYREL ) 50 MG tablet Take 1 tablet (50 mg total) by mouth at bedtime as needed for sleep. 30 tablet 0    PTA Medications:  Facility Ordered Medications  Medication   [COMPLETED] thiamine  (VITAMIN B1) injection 100 mg   escitalopram  (LEXAPRO ) tablet 10 mg   acetaminophen  (TYLENOL ) tablet 650 mg   alum & mag hydroxide-simeth (MAALOX/MYLANTA) 200-200-20 MG/5ML suspension 30 mL   magnesium  hydroxide (MILK OF MAGNESIA) suspension 30 mL   haloperidol  (HALDOL ) tablet 5 mg   And   diphenhydrAMINE  (BENADRYL ) capsule 50 mg   haloperidol  lactate (HALDOL ) injection 5 mg  And   diphenhydrAMINE  (BENADRYL ) injection 50 mg   And   LORazepam  (ATIVAN ) injection 2 mg   haloperidol  lactate (HALDOL ) injection 10 mg   And   diphenhydrAMINE  (BENADRYL ) injection 50 mg   And   LORazepam  (ATIVAN ) injection 2 mg   hydrOXYzine  (ATARAX ) tablet 25 mg   traZODone  (DESYREL ) tablet 50 mg   [COMPLETED] LORazepam  (ATIVAN ) tablet 1 mg   Followed by   [COMPLETED] LORazepam  (ATIVAN ) tablet 1 mg   Followed by   [COMPLETED] LORazepam  (ATIVAN ) tablet 1 mg   Followed by   [COMPLETED] LORazepam  (ATIVAN ) tablet 1 mg   thiamine  (VITAMIN B1) tablet 100 mg   ARIPiprazole  (ABILIFY ) tablet 5 mg   Vitamin D  (Ergocalciferol ) (DRISDOL ) 1.25 MG (50000 UNIT) capsule 50,000 Units   [COMPLETED] ondansetron  (ZOFRAN -ODT) disintegrating tablet 4 mg   PTA Medications  Medication Sig   Multiple Vitamins-Minerals (B COMPLEX-C-E-ZINC) tablet Take 1 tablet by mouth daily.   [START ON 03/06/2024] ARIPiprazole  (ABILIFY ) 5 MG tablet Take 1 tablet (5 mg total) by mouth daily.   escitalopram  (LEXAPRO ) 10 MG tablet Take 1 tablet (10 mg total) by mouth at bedtime.   traZODone  (DESYREL ) 50 MG tablet Take 1 tablet (50 mg total) by mouth at bedtime as needed for sleep.    hydrOXYzine  (ATARAX ) 25 MG tablet Take 1 tablet (25 mg total) by mouth 3 (three) times daily as needed for anxiety.       03/03/2024   12:05 PM 02/29/2024    4:41 PM  Depression screen PHQ 2/9  Decreased Interest 1 1  Down, Depressed, Hopeless 1 1  PHQ - 2 Score 2 2  Altered sleeping 1 1  Tired, decreased energy 1 1  Change in appetite 1 1  Feeling bad or failure about yourself  1 1  Trouble concentrating 1 1  Moving slowly or fidgety/restless 1 1  Suicidal thoughts 1 1  PHQ-9 Score 9 9  Difficult doing work/chores Very difficult Somewhat difficult    Flowsheet Row ED from 02/28/2024 in [facility] ED from 02/27/2024 in [facility] ED from 10/03/2022 in [facility] Department [facility]  C-SSRS RISK CATEGORY No Risk Error: Q3, 4, or 5 should not be populated when Q2 is No No Risk    Musculoskeletal  Strength & Muscle Tone: within normal limits Gait & Station: normal Patient leans: N/A  Psychiatric Specialty Exam  Presentation  General Appearance:  Appropriate for Environment  Eye Contact: Fair  Speech: Normal Rate  Speech Volume: Normal  Handedness: Right   Mood and Affect  Mood: Euthymic  Affect: Appropriate   Thought Process  Thought Processes: Linear  Descriptions of Associations:Intact  Orientation:Full (Time, Place and Person)  Thought Content:Logical  Diagnosis of Schizophrenia or Schizoaffective disorder in past: No    Hallucinations:Hallucinations: None  Ideas of Reference:None  Suicidal Thoughts:Suicidal Thoughts: No  Homicidal Thoughts:Homicidal Thoughts: No   Sensorium  Memory: Immediate Good; Recent Good; Remote Fair  Judgment: Good  Insight: Good   Executive Functions  Concentration: Good  Attention Span: Good  Recall: Good  Fund of Knowledge: Good  Language: Good   Psychomotor Activity  Psychomotor Activity: Psychomotor  Activity: Normal   Assets  Assets: Communication Skills; Desire for Improvement; Leisure Time   Sleep  Sleep: Sleep: Fair  Estimated Sleeping Duration (Last 24 Hours): 10.00-11.50 hours  No data recorded  Physical Exam  Physical Exam Vitals and nursing note reviewed.  Constitutional:  Appearance: Normal appearance.  HENT:     Head: Normocephalic.  Eyes:     Extraocular Movements: Extraocular movements intact.  Pulmonary:     Effort: Pulmonary effort is normal.  Musculoskeletal:     Cervical back: Normal range of motion.  Neurological:     General: No focal deficit present.     Mental Status: He is alert and oriented to person, place, and time.  Psychiatric:        Mood and Affect: Mood normal.        Behavior: Behavior normal.    Review of Systems  Constitutional:  Negative for chills and fever.  Respiratory:  Negative for cough.   Cardiovascular:  Negative for chest pain.  Gastrointestinal:  Negative for constipation, diarrhea, nausea and vomiting.  Genitourinary:  Negative for dysuria.  Musculoskeletal:  Negative for myalgias.  Skin:  Negative for rash.  Neurological:  Positive for headaches. Negative for tremors.  Psychiatric/Behavioral:  Negative for hallucinations and suicidal ideas.    Blood pressure 103/70, pulse 64, temperature 98.5 F (36.9 C), temperature source Oral, resp. rate 16, SpO2 97%. There is no height or weight on file to calculate BMI.  Demographic Factors:  Male  Loss Factors: NA  Historical Factors: Family history of mental illness or substance abuse  Risk Reduction Factors:   Positive coping skills or problem solving skills  Continued Clinical Symptoms:  Alcohol/Substance Abuse/Dependencies  Cognitive Features That Contribute To Risk:  None    Suicide Risk:  Minimal: No identifiable suicidal ideation.  Patients presenting with no risk factors but with morbid ruminations; may be classified as minimal risk based on the  severity of the depressive symptoms  Plan Of Care/Follow-up recommendations:  Activity:  ad lib Diet:  low sodium ,low fat    Disposition: to home with substance abuse follow up  Corean Anette Potters, MD 03/05/2024, 9:55 AM

## 2024-03-05 NOTE — ED Notes (Signed)
 Discharge paperwork explained to pt including avs, questions denied. Pt escorted off unit by staff, items returned.
